# Patient Record
Sex: Male | Born: 1960 | Race: White | Hispanic: No | Marital: Married | Smoking: Never smoker
Health system: Southern US, Community
[De-identification: ages and names within clinical notes are randomized; demographics above are authoritative.]

## PROBLEM LIST (undated history)

## (undated) DIAGNOSIS — G4730 Sleep apnea, unspecified: Secondary | ICD-10-CM

## (undated) DIAGNOSIS — M545 Low back pain, unspecified: Secondary | ICD-10-CM

## (undated) DIAGNOSIS — E669 Obesity, unspecified: Secondary | ICD-10-CM

## (undated) DIAGNOSIS — I8390 Asymptomatic varicose veins of unspecified lower extremity: Secondary | ICD-10-CM

## (undated) DIAGNOSIS — T7840XA Allergy, unspecified, initial encounter: Secondary | ICD-10-CM

## (undated) HISTORY — DX: Low back pain, unspecified: M54.50

## (undated) HISTORY — DX: Obesity, unspecified: E66.9

## (undated) HISTORY — DX: Asymptomatic varicose veins of unspecified lower extremity: I83.90

## (undated) HISTORY — DX: Sleep apnea, unspecified: G47.30

## (undated) HISTORY — DX: Low back pain: M54.5

## (undated) HISTORY — PX: HERNIA REPAIR: SHX51

## (undated) HISTORY — DX: Allergy, unspecified, initial encounter: T78.40XA

## (undated) HISTORY — PX: OTHER SURGICAL HISTORY: SHX169

---
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
## 2002-07-25 ENCOUNTER — Ambulatory Visit (HOSPITAL_BASED_OUTPATIENT_CLINIC_OR_DEPARTMENT_OTHER): Admission: RE | Admit: 2002-07-25 | Discharge: 2002-07-25 | Payer: Self-pay | Admitting: General Surgery

## 2002-07-25 ENCOUNTER — Encounter (INDEPENDENT_AMBULATORY_CARE_PROVIDER_SITE_OTHER): Payer: Self-pay | Admitting: Specialist

## 2003-08-14 ENCOUNTER — Ambulatory Visit (HOSPITAL_COMMUNITY): Admission: RE | Admit: 2003-08-14 | Discharge: 2003-08-14 | Payer: Self-pay | Admitting: General Surgery

## 2005-10-19 ENCOUNTER — Encounter: Payer: Self-pay | Admitting: Family Medicine

## 2005-11-28 ENCOUNTER — Encounter: Admission: RE | Admit: 2005-11-28 | Discharge: 2005-11-28 | Payer: Self-pay | Admitting: Orthopedic Surgery

## 2005-12-19 ENCOUNTER — Encounter: Admission: RE | Admit: 2005-12-19 | Discharge: 2005-12-19 | Payer: Self-pay | Admitting: Orthopedic Surgery

## 2006-03-21 ENCOUNTER — Emergency Department (HOSPITAL_COMMUNITY): Admission: EM | Admit: 2006-03-21 | Discharge: 2006-03-21 | Payer: Self-pay | Admitting: Emergency Medicine

## 2006-03-22 ENCOUNTER — Emergency Department (HOSPITAL_COMMUNITY): Admission: EM | Admit: 2006-03-22 | Discharge: 2006-03-22 | Payer: Self-pay | Admitting: Emergency Medicine

## 2006-04-02 ENCOUNTER — Encounter: Payer: Self-pay | Admitting: Family Medicine

## 2006-09-24 ENCOUNTER — Ambulatory Visit: Payer: Self-pay | Admitting: Family Medicine

## 2006-09-24 DIAGNOSIS — M5410 Radiculopathy, site unspecified: Secondary | ICD-10-CM | POA: Insufficient documentation

## 2006-09-24 DIAGNOSIS — Z9189 Other specified personal risk factors, not elsewhere classified: Secondary | ICD-10-CM | POA: Insufficient documentation

## 2006-09-24 LAB — CONVERTED CEMR LAB
Bilirubin Urine: NEGATIVE
Blood in Urine, dipstick: NEGATIVE
Ketones, urine, test strip: NEGATIVE
Nitrite: NEGATIVE
Protein, U semiquant: NEGATIVE
Urobilinogen, UA: NEGATIVE

## 2006-09-25 LAB — CONVERTED CEMR LAB
ALT: 26 units/L (ref 0–53)
Albumin: 4.1 g/dL (ref 3.5–5.2)
Alkaline Phosphatase: 49 units/L (ref 39–117)
BUN: 13 mg/dL (ref 6–23)
Basophils Absolute: 0 10*3/uL (ref 0.0–0.1)
Basophils Relative: 0.2 % (ref 0.0–1.0)
CO2: 29 meq/L (ref 19–32)
Calcium: 9.2 mg/dL (ref 8.4–10.5)
Cholesterol: 158 mg/dL (ref 0–200)
Creatinine, Ser: 0.8 mg/dL (ref 0.4–1.5)
HDL: 31.9 mg/dL — ABNORMAL LOW (ref >39.0)
Hemoglobin: 14.3 g/dL (ref 13.0–17.0)
LDL Cholesterol: 113 mg/dL — ABNORMAL HIGH (ref 0–99)
MCHC: 34.3 g/dL (ref 30.0–36.0)
Monocytes Absolute: 0.4 10*3/uL (ref 0.2–0.7)
Monocytes Relative: 7.6 % (ref 3.0–11.0)
Platelets: 280 10*3/uL (ref 150–400)
Potassium: 4.3 meq/L (ref 3.5–5.1)
RBC: 4.7 M/uL (ref 4.22–5.81)
RDW: 12.6 % (ref 11.5–14.6)
Total Bilirubin: 0.9 mg/dL (ref 0.3–1.2)
Total CHOL/HDL Ratio: 5
Triglycerides: 67 mg/dL (ref 0–149)
VLDL: 13 mg/dL (ref 0–40)

## 2006-09-30 ENCOUNTER — Encounter: Payer: Self-pay | Admitting: Family Medicine

## 2006-10-16 ENCOUNTER — Ambulatory Visit: Payer: Self-pay | Admitting: Family Medicine

## 2006-10-16 DIAGNOSIS — G4733 Obstructive sleep apnea (adult) (pediatric): Secondary | ICD-10-CM | POA: Insufficient documentation

## 2006-11-09 ENCOUNTER — Ambulatory Visit: Payer: Self-pay | Admitting: Pulmonary Disease

## 2006-11-16 ENCOUNTER — Ambulatory Visit: Payer: Self-pay | Admitting: Pulmonary Disease

## 2006-11-16 ENCOUNTER — Ambulatory Visit (HOSPITAL_BASED_OUTPATIENT_CLINIC_OR_DEPARTMENT_OTHER): Admission: RE | Admit: 2006-11-16 | Discharge: 2006-11-16 | Payer: Self-pay | Admitting: Pulmonary Disease

## 2006-12-08 ENCOUNTER — Ambulatory Visit: Payer: Self-pay | Admitting: Pulmonary Disease

## 2007-01-28 ENCOUNTER — Ambulatory Visit: Payer: Self-pay | Admitting: Pulmonary Disease

## 2007-01-28 DIAGNOSIS — E669 Obesity, unspecified: Secondary | ICD-10-CM | POA: Insufficient documentation

## 2007-01-29 ENCOUNTER — Encounter: Payer: Self-pay | Admitting: Family Medicine

## 2007-06-03 ENCOUNTER — Encounter: Payer: Self-pay | Admitting: Family Medicine

## 2007-06-22 ENCOUNTER — Encounter: Payer: Self-pay | Admitting: Pulmonary Disease

## 2007-08-06 ENCOUNTER — Ambulatory Visit: Payer: Self-pay | Admitting: Pulmonary Disease

## 2008-07-26 ENCOUNTER — Ambulatory Visit: Payer: Self-pay | Admitting: Pulmonary Disease

## 2008-08-31 ENCOUNTER — Encounter: Payer: Self-pay | Admitting: Family Medicine

## 2008-11-10 ENCOUNTER — Encounter (INDEPENDENT_AMBULATORY_CARE_PROVIDER_SITE_OTHER): Payer: Self-pay | Admitting: *Deleted

## 2010-01-01 ENCOUNTER — Ambulatory Visit: Payer: Self-pay | Admitting: Family Medicine

## 2010-01-01 ENCOUNTER — Encounter: Payer: Self-pay | Admitting: Family Medicine

## 2010-01-03 ENCOUNTER — Encounter: Payer: Self-pay | Admitting: Family Medicine

## 2010-01-08 LAB — CONVERTED CEMR LAB
ALT: 23 units/L (ref 0–53)
AST: 22 units/L (ref 0–37)
Alkaline Phosphatase: 48 units/L (ref 39–117)
BUN: 16 mg/dL (ref 6–23)
Bilirubin, Direct: 0.2 mg/dL (ref 0.0–0.3)
Cholesterol: 149 mg/dL (ref 0–200)
Creatinine, Ser: 0.6 mg/dL (ref 0.4–1.5)
Eosinophils Relative: 1 % (ref 0.0–5.0)
GFR calc non Af Amer: 140.58 mL/min (ref >60.00)
LDL Cholesterol: 96 mg/dL (ref 0–99)
Lymphocytes Relative: 27.5 % (ref 12.0–46.0)
Monocytes Relative: 6.3 % (ref 3.0–12.0)
Neutrophils Relative %: 64.7 % (ref 43.0–77.0)
PSA: 1.74 ng/mL (ref 0.10–4.00)
Platelets: 258 10*3/uL (ref 150.0–400.0)
Potassium: 4.4 meq/L (ref 3.5–5.1)
RBC: 4.73 M/uL (ref 4.22–5.81)
Total Bilirubin: 1.1 mg/dL (ref 0.3–1.2)
Total CHOL/HDL Ratio: 4
Triglycerides: 88 mg/dL (ref 0.0–149.0)
VLDL: 17.6 mg/dL (ref 0.0–40.0)
WBC: 5.3 10*3/uL (ref 4.5–10.5)

## 2010-02-14 NOTE — Letter (Signed)
Summary: Patient HX Form  Patient HX Form   Imported By: Georgian Co 01/03/2010 16:03:43  _____________________________________________________________________  External Attachment:    Type:   Image     Comment:   External Document

## 2010-02-14 NOTE — Assessment & Plan Note (Signed)
Summary: PT RE-EST / PT REQ CPX (PT WILL COME FASTING FOR LABS) // RS   Vital Signs:  Patient profile:   50 year old male Height:      70 inches Weight:      254 pounds BMI:     36.58 O2 Sat:      97 % Temp:     98.4 degrees F Pulse rate:   77 / minute BP sitting:   112 / 80  (left arm) Cuff size:   large  Vitals Entered By: Pura Spice, RN (January 01, 2010 9:01 AM) CC: to re-est  requesting cpx fasting for labs  Is Patient Diabetic? No   History of Present Illness: 50 yr old male to re-establish with [place] and for a cpx. He main complaint today is low back pain, which has bothered him for years. He plans on seeing Dr. Charlett Blake again after the holidays. He is using either Aleeve or Advil as needed . He knows he needs to lose weight.   Allergies (verified): No Known Drug Allergies  Past History:  Past Medical History: Low back pain, has seen Dr. Lunette Stands [facility], sees Dr. Coralyn Helling     - PSG 12/01/06 AHI 15     - CPAP 8 cm Varicose Veins Obesity  Past Surgical History: Tonsillectomy 1967                                                                                                                                                       Umbilical hernia repairs 2005 and 2006 (last by Dr. Abbey Chatters) right lower leg vein laser ablations per Dr. Consuela Mimes colonoscopy 01-29-07 per Dr. Carman Ching, sigmoid diverticulosis and internal hemorrhoids, repeat in 5 yrs  Family History: Reviewed history from 09/24/2006 and no changes required. Family History of Arthritis Family History of Colon CA 1st degree relative <60 (mother) Family History Hypertension Family History Lung cancer  Social History: Reviewed history from 09/24/2006 and no changes required. Occupation: Married Never Smoked Alcohol use-no Drug use-no Regular exercise-no  Review of Systems  The patient denies anorexia, fever, weight loss, vision loss, decreased hearing, hoarseness, chest pain,  syncope, dyspnea on exertion, peripheral edema, prolonged cough, headaches, hemoptysis, abdominal pain, melena, hematochezia, severe indigestion/heartburn, hematuria, incontinence, genital sores, muscle weakness, suspicious skin lesions, transient blindness, difficulty walking, depression, unusual weight change, abnormal bleeding, enlarged lymph nodes, angioedema, breast masses, and testicular masses.    Physical Exam  General:  overweight-appearing.   Head:  Normocephalic and atraumatic without obvious abnormalities. No apparent alopecia or balding. Eyes:  No corneal or conjunctival inflammation noted. EOMI. Perrla. Funduscopic exam benign, without hemorrhages, exudates or papilledema. Vision grossly normal. Ears:  External ear exam shows no significant lesions or deformities.  Otoscopic examination reveals clear canals, tympanic membranes are intact bilaterally without bulging,  retraction, inflammation or discharge. Hearing is grossly normal bilaterally. Nose:  External nasal examination shows no deformity or inflammation. Nasal mucosa are pink and moist without lesions or exudates. Mouth:  Oral mucosa and oropharynx without lesions or exudates.  Teeth in good repair. Neck:  No deformities, masses, or tenderness noted. Chest Wall:  No deformities, masses, tenderness or gynecomastia noted. Lungs:  Normal respiratory effort, chest expands symmetrically. Lungs are clear to auscultation, no crackles or wheezes. Heart:  Normal rate and regular rhythm. S1 and S2 normal without gallop, murmur, click, rub or other extra sounds. EKG normal  Abdomen:  Bowel sounds positive,abdomen soft and non-tender without masses, organomegaly or hernias noted. Rectal:  No external abnormalities noted. Normal sphincter tone. No rectal masses or tenderness. Heme neg Genitalia:  Testes bilaterally descended without nodularity, tenderness or masses. No scrotal masses or lesions. No penis lesions or urethral  discharge. Prostate:  Prostate gland firm and smooth, no enlargement, nodularity, tenderness, mass, asymmetry or induration. Msk:  No deformity or scoliosis noted of thoracic or lumbar spine.   Pulses:  R and L carotid,radial,femoral,dorsalis pedis and posterior tibial pulses are full and equal bilaterally Extremities:  No clubbing, cyanosis, edema, or deformity noted with normal full range of motion of all joints.   Neurologic:  No cranial nerve deficits noted. Station and gait are normal. Plantar reflexes are down-going bilaterally. DTRs are symmetrical throughout. Sensory, motor and coordinative functions appear intact. Skin:  Intact without suspicious lesions or rashes Cervical Nodes:  No lymphadenopathy noted Axillary Nodes:  No palpable lymphadenopathy Inguinal Nodes:  No significant adenopathy Psych:  Cognition and judgment appear intact. Alert and cooperative with normal attention span and concentration. No apparent delusions, illusions, hallucinations   Impression & Recommendations:  Problem # 1:  EXAMINATION, ROUTINE MEDICAL (ICD-V70.0)  Orders: UA Dipstick w/o Micro (automated)  (81003) Hemoccult Guaiac-1 spec.(in office) (82270) EKG w/ Interpretation (93000) Venipuncture (16109) TLB-Lipid Panel (80061-LIPID) TLB-BMP (Basic Metabolic Panel-BMET) (80048-METABOL) TLB-CBC Platelet - w/Differential (85025-CBCD) TLB-Hepatic/Liver Function Pnl (80076-HEPATIC) TLB-TSH (Thyroid Stimulating Hormone) (84443-TSH) TLB-PSA (Prostate Specific Antigen) (84153-PSA)  Complete Medication List: 1)  Diasense Multivitamin Tabs (Multiple vitamins-minerals) .... Take 1 tablet by mouth once a day 2)  Meloxicam 15 Mg Tabs (Meloxicam) .... Once daily  Patient Instructions: 1)  It is important that you exercise reguarly at least 20 minutes 5 times a week. If you develop chest pain, have severe difficulty breathing, or feel very tired, stop exercising immediately and seek medical attention.  2)   You need to lose weight. Consider a lower calorie diet and regular exercise.  3)  get fasting labs today 4)  try Meloxicam daily for the back pain  Prescriptions: MELOXICAM 15 MG TABS (MELOXICAM) once daily  #30 x 11   Entered and Authorized by:   Nelwyn Salisbury MD   Signed by:   Nelwyn Salisbury MD on 01/01/2010   Method used:   Electronically to        [facility]  [phone]* (retail)       [address] [place], [place]  [address]       Ph: [phone] or [phone]       Fax: [phone]   RxID:   [id number]    Orders Added: 1)  New Patient 40-64 years [99386] 2)  UA Dipstick w/o Micro (automated)  [81003] 3)  Hemoccult Guaiac-1 spec.(in office) [82270] 4)  EKG w/ Interpretation [93000] 5)  Venipuncture [03474] 6)  TLB-Lipid Panel [80061-LIPID] 7)  TLB-BMP (Basic Metabolic Panel-BMET) [80048-METABOL] 8)  TLB-CBC Platelet - w/Differential [85025-CBCD] 9)  TLB-Hepatic/Liver Function Pnl [80076-HEPATIC] 10)  TLB-TSH (Thyroid Stimulating Hormone) [84443-TSH] 11)  TLB-PSA (Prostate Specific Antigen) [78469-GEX]   Immunization History:  Tetanus/Td Immunization History:    Tetanus/Td:  historical (01/13/2006)   Immunization History:  Tetanus/Td Immunization History:    Tetanus/Td:  Historical (01/13/2006)  Appended Document: Orders Update    Clinical Lists Changes  Orders: Added new Service order of Specimen Handling (52841) - Signed      Appended Document: PT RE-EST / PT REQ CPX (PT WILL COME FASTING FOR LABS) // RS  Laboratory Results   Urine Tests    Routine Urinalysis   Color: yellow Appearance: Clear Glucose: negative   (Normal Range: Negative) Bilirubin: negative   (Normal Range: Negative) Ketone: negative   (Normal Range: Negative) Spec. Gravity: 1.025   (Normal Range: 1.003-1.035) Blood: negative   (Normal Range: Negative) pH: 5.0   (Normal Range: 5.0-8.0) Protein: negative   (Normal Range: Negative) Urobilinogen: 0.2    (Normal Range: 0-1) Nitrite: negative   (Normal Range: Negative) Leukocyte Esterace: negative   (Normal Range: Negative)    Comments: Rita Ohara  January 01, 2010 11:39 AM

## 2010-05-28 NOTE — Assessment & Plan Note (Signed)
[facility]                             PULMONARY OFFICE NOTE   NAME:PAGEBookert, Guzzi                           MRN:          [id number]  DATE:12/08/2006                            DOB:          07-14-1960    I saw Mr. Wile in followup today after he had undergone his overnight  polysomnogram.   This was done on November 16, 2006.  He followed a split-night-study  protocol.  During the diagnostic portion of the test he was found to  have moderate obstructive sleep apnea with an apnea/hypopnea index of 15  and oxygen saturation nadir of 89%.  During the therapeutic portion of  the test he was titrated to a CPAP pressure setting of 8 with a  reduction in his apnea/hypopnea index to 4.  At this pressure he was  observed in REM sleep but only had a minimal amount of supine sleep.  I  have reviewed the results of his sleep study with him.  I had again  emphasized to him the importance of diet, exercise and weight reduction.  I had reviewed other treatment options for his sleep apnea including an  oral appliance and surgical intervention.   Given the fact that he did have a good response to his titration portion  of the study, I will initiate him on CPAP at 8 cmH2O.  I will then  follow up with him in approximately 2 months to determine if any further  adjustments will need to be made.     Coralyn Helling, MD  Electronically Signed    VS/MedQ  DD: 12/08/2006  DT: 12/08/2006  Job #: [id number]   cc:   Tera Mater. Clent Ridges, MD

## 2010-05-28 NOTE — Assessment & Plan Note (Signed)
[facility]                             PULMONARY OFFICE NOTE   NAME:PAGEJoshiah, Traynham                           MRN:          [id number]  DATE:11/09/2006                            DOB:          [birth year]    REFERRING PHYSICIAN:  Tera Mater. Clent Ridges, MD   REFERRING PHYSICIAN:  Tera Mater. Clent Ridges, MD.   I met Mr. Battiste today for evaluation of his sleep difficulties.   He said that he wanted to have further evaluation of his sleep after a  friend of his wife's was recently in a car accident and was later found  out to have sleep apnea.  His wife has noticed him having problems with  snoring for years, although he is not sure if she has ever noticed him  actually stop breathing while he is asleep.  He says he wakes up a few  times during the night, sometimes related to back pain as well as to  grinding his teeth.  He usually goes to bed at around 11:30, although he  will sometimes fall asleep prior to this.  He falls asleep fairly  quickly.  He wakes up at 5:45 in the morning to get ready to go to work.  He will occasionally get a headache when he wakes up in the morning.  He  usually still feels tired when he wakes up.  He will sometimes sleep in  and awakens until about 9 o'clock.  He says he usually ends up trying to  take a nap in the afternoon which helps to some degree.  He denies any  history of sleep hallucinations, sleep paralysis or cataplexy.  There is  no history of restless leg syndrome.  He is not currently using anything  now to help him fall asleep at night.  He does drink several caffeinated  beverages throughout the day.  He denies any history of sleep walking,  sleep talking, nightmares or night terrors.  His Epworth score today is  7/24.   PAST MEDICAL HISTORY:  Significant for  1. Umbilical hernia, status post repair in 2004 and 2005.  2. Tonsillectomy as a child.  3. Sciatica.  4. Varicose veins.   CURRENT MEDICATIONS:  1. Multivitamin  daily.  2. Tylenol p.r.n.  3. Ibuprofen p.r.n.   ALLERGIES:  No known drug allergies.   SOCIAL HISTORY:  He is married.  He has no children.  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  There is no history of  tobacco or alcohol use.   FAMILY HISTORY:  Significant for his father who had emphysema and lung  cancer and his mother had colon cancer.   REVIEW OF SYSTEMS:  Unremarkable except for as stated above.   PHYSICAL EXAMINATION:  VITAL SIGNS:  He is 5 feet 10 inches tall, 266  pounds, temperature 98.7, blood pressure 124/88, heart rate 88, oxygen  saturation 96% on room air.  HEENT:  Pupils reactive.  There is no sinus tenderness.  He has narrow  nasal angles.  He has Mallampati III airway.  There are erosions over  his frontal teeth.  He has scalloped borders of his tongue.  NECK:  There was no lymphadenopathy, no thyromegaly.  CHEST:  No wheezing or rales.  CARDIOVASCULAR:  S1 and S2.  ABDOMEN:  Obese, soft, nontender.  EXTREMITIES:  No edema.   IMPRESSION:  He certainly has symptoms as well as physical findings  which would be concerning for sleep disordered breathing.  To further  evaluate this, I will arrange for him to undergo an overnight  polysomnogram.  In the meantime, I have discussed with him the  importance of diet, exercise and weight reduction as well as avoidance  of alcohol and sedatives.  Driving precautions were reviewed with him as  well.  I will follow up with him in the office after having a chance to  review his sleep study.     Coralyn Helling, MD  Electronically Signed    VS/MedQ  DD: 11/09/2006  DT: 11/09/2006  Job #: [id number]

## 2010-05-28 NOTE — Procedures (Signed)
NAME:  Brent Gordon, Brent Gordon                  ACCOUNT NO.:  [id number]   MEDICAL RECORD NO.:  [id number]          PATIENT TYPE:  OUT   LOCATION:  SLEEP CENTER                 FACILITY:  [facility]   PHYSICIAN:  Coralyn Helling, MD        DATE OF BIRTH:  03-Aug-1960   DATE OF STUDY:                            NOCTURNAL POLYSOMNOGRAM   REFERRING PHYSICIAN:   INDICATION FOR STUDY:  This is an individual who has a history of sleep  obstruction and excess of daytime sleepiness.  He is referred to the  sleep lab for evaluation of hypersomnia with obstructive sleep apnea.   EPWORTH SLEEPINESS SCORE:  7.   MEDICATIONS:  Aleve.   SLEEP ARCHITECTURE:  The patient followed a split-night study protocol.  Through the diagnostic portion of the test, total test time was 199  minutes.  Total sleep time was 131 minutes.  Sleep efficiency was 66%  which is reduced.  The patient slept in both the supine and nonsupine  positions.   RESPIRATORY DATA:  The average respiratory rate was 20.  Through the  diagnostic portion of the test, the overall apnea polypnea index was 15.  There was one central apneic event.  The remainder of the events were  obstructive in nature.  Moderate snoring was noted by the technician.  During the therapy portion of the test, the patient was titrated from  CPAP pressure setting of 4 to 8 cm of water.  At a CPAP pressure of 8 cm  of water, the apnea polypnea index was reduced to 4.  At this pressure  setting, the patient was observed in REM sleep, but had very minimal  amount of supine sleep.   OXYGEN DATA:  The baseline oxygenation was 94%.  The oxygen saturation  nadir was 89%.  The oxygen saturation nadir had a CPAP of 8, it was 89%.   CARDIAC DATA:  The average heart rate was 65.  The rhythm strip showed  normal sinus rhythm.   MOVEMENT-PARASOMNIA:  The periodic limb movement index was 9.2.   IMPRESSIONS-RECOMMENDATIONS:  This was a split-night study protocol.  During the diagnostic  portion of the test, the apnea polypnea index was  found to be 15 and the oxygen saturation nadir was 89% consistent with  moderate obstructive sleep apnea.  During the therapeutic portion of the  test, the patient was titrated to a CPAP setting of 8 with reduction of  his apnea polypnea index to 4.  He was observed in REM sleep, however,  had very minimal amount of supine sleep.  What I have recommended is to  start the patient on CPAP at 8 cm of water.  If he  is having difficulty with this, he could either return to the sleep lab  for a repeat titration study, undergo an outpatient auto-CPAP titration  study or alternatively be titrated using BiPAP.      Coralyn Helling, MD  Diplomat, American Board of Sleep Medicine  Electronically Signed     VS/MEDQ  D:  12/01/2006 14:25:55  T:  12/02/2006 09:32:00  Job:  [id number]

## 2010-05-31 NOTE — Op Note (Signed)
NAME:  Brent Gordon, Brent Gordon                              ACCOUNT NO.:  [id number]   MEDICAL RECORD NO.:  [id number]                   PATIENT TYPE:  AMB   LOCATION:  DSC                                  FACILITY:  [facility]   PHYSICIAN:  Anselm Pancoast. Zachery Dakins, M.D.          DATE OF BIRTH:  17-Aug-1960   DATE OF PROCEDURE:  07/26/2002  DATE OF DISCHARGE:                                 OPERATIVE REPORT   PREOPERATIVE DIAGNOSIS:  Umbilical hernia.   POSTOPERATIVE DIAGNOSIS:  Umbilical hernia.   OPERATION:  Repair of umbilical hernia with mesh reinforcement.   General anesthesia.   SURGEON:  Anselm Pancoast. Zachery Dakins, M.D.   HISTORY:  Brent Gordon is a 50 year old male who was referred to me by  Talmadge Coventry, M.D., for management of a symptomatic umbilical hernia.  On examination he did not have a very big fascial defect but with straining,  he would get a sort of an egg-sized mass to the right of the umbilicus and I  recommended this be surgically corrected.  He has had intermittent episodes  of cramping, bloating abdominal pain, but they are not frequent.   On prepping, the area was first clipped and then the hair removed with tape  and then prepped with Betadine surgical solution and draped in a sterile  manner.  He had had induction with general anesthesia, endotracheal tube.  The patient weighs about 205 pounds.  A transverse incision was made, the  sharp dissection down through the subcutaneous tissue, and the fairly thick-  walled umbilical hernia sac was removed circumferentially.  I opened the  hernia, and there were two little wads of preperitoneal fat sort of trapped.  There was no actual loop of intestine there, and it was free, and then I  freed the peritoneum circumferentially and then closed it with a 2-0 Vicryl,  first with a pursestring and then in separated interrupted sutures,  reinforcing the closure.  Next I freed up the fascia circumferentially so  this could be closed  transversely but would also allow [place] to put a little  piece of Prolene mesh, about a 1-1/2 x 3 inch area kind of in the  preperitoneal space.  This was anchored with four stitches accordingly  through the muscle layer, and then I placed a superior and inferior stitch  to kind of bring up the midportion of the fascia to the muscle layer.  Next  the interrupted transverse vertical stitches incorporated in the center of  the mesh with the center of the stitch was used to actually close the  fascial defect.  Its closed course ________.  I then anesthetized the area  with about 20 mL of Marcaine with adrenalin and then closed the subcutaneous  tissue with a 3-  0 Vicryl and then closed the skin with interrupted 4-0 nylon sutures.  Hopefully, the patient will not have two much pain, and he was  instructed to  not shower for about three days.  I will see him in the office in  approximately a week.  He hopes to return to work next week and he should be  able to do that.                                               Anselm Pancoast. Zachery Dakins, M.D.    WJW/MEDQ  D:  07/25/2002  T:  07/26/2002  Job:  [id number]   cc:   Talmadge Coventry, M.D.  [address], [address]  [place]  [place] [address]  Fax: [phone]

## 2010-05-31 NOTE — Op Note (Signed)
NAME:  Brent Gordon, Brent Gordon                              ACCOUNT NO.:  [id number]   MEDICAL RECORD NO.:  [id number]                   PATIENT TYPE:  AMB   LOCATION:  DAY                                  FACILITY:  [facility]   PHYSICIAN:  Adolph Pollack, M.D.            DATE OF BIRTH:  1960/08/25   DATE OF PROCEDURE:  08/14/2003  DATE OF DISCHARGE:                                 OPERATIVE REPORT   PREOPERATIVE DIAGNOSIS:  Recurrent umbilical hernia.   POSTOPERATIVE DIAGNOSIS:  Recurrent umbilical hernia.   PROCEDURE:  Laparoscopic repair of recurrent umbilical hernia with mesh.   SURGEON:  Adolph Pollack, M.D.   ASSISTANT:  Anselm Pancoast. Zachery Dakins, M.D.   ANESTHESIA:  General.   INDICATIONS:  Brent Gordon is a 50 year old man who is very stout and had an  umbilical hernia repair with mesh done approximately one year ago.  He does  a lot of heavy lifting repetitively, and he has recurrence of his hernia.  He now presents for laparoscopic repair.  The procedure and the risks were  discussed with him preoperatively.   TECHNIQUE:  He is seen in the holding area and then brought to the operating  room, placed supine on the operating room table, and general anesthetic was  administered.  A Foley catheter was placed in his bladder.  The abdominal  wall here was clipped.  The abdominal wall was then sterilely prepped and  draped.  A small incision was made in the left upper quadrant.  Using the 5  mm Versaport trocar, the peritoneal cavity was entered.  I went through the  obturator from the trocar, and looking directly down was omentum.  No  visceral injury was noted.  Once the abdomen was insufflated with CO2 gas, I  then placed a 10 mm trocar in the left mid lateral abdomen and the 5 mm  trocar in the right mid lateral abdomen.  I identified the hernia.  I had  reduced the contents prior to that after the induction of anesthesia.  Nothing was incarcerated in the hernia.  I could see where the  mesh was  pulled away and was on the left side of the hernia.  I then marked the  quadrants of the hernia with a spinal needle and measured 4 cm away from  these, drawing a large oval.  A piece of polypropylene mesh with a  nonadhesive barrier is brought into the field and cut appropriately.  Four  anchoring sutures of 0 Novofil were placed in the four quadrants.  The mesh  was then hydrated and then introduced into the peritoneal cavity.  Four  smaller incisions were made at the four quadrants around the area of the  hernia.  Anchoring sutures were then brought up across a fascial bridge at  each of the four quadrants with the smooth side, nonadhesive barrier facing  down, and the rough  side facing up.  I then tied these down, anchoring the  mesh to the abdominal wall.  I then introduced another 5 mm trocar into the  right lower quadrant.  Using the spiral tacking device, I then anchored the  mesh further to the anterior abdominal wall with the spiral tacks.  This  provided for more than adequate coverage of the defect.   Following this, I released the CO2 gas and watched the underlying omentum  become approximated with the mesh.  I then removed all of the trocars.  All  of the small incisions were then closed with 4-0 Monocryl subcuticular  stitches.  Steri-Strips and sterile dressings were applied.  He tolerated  the procedure well without any apparent complications and was taken to the  recovery room in satisfactory condition.  An abdominal binder will be  applied.   The plan would be to try to discharge him to home today as an outpatient  procedure.  If the pain control is too difficult, we may have to keep him  for 23-hour observation.                                               Adolph Pollack, M.D.    Kari Baars  D:  08/14/2003  T:  08/14/2003  Job:  [id number]

## 2011-03-18 ENCOUNTER — Other Ambulatory Visit (INDEPENDENT_AMBULATORY_CARE_PROVIDER_SITE_OTHER): Payer: BC Managed Care – PPO

## 2011-03-18 DIAGNOSIS — Z0000 Encounter for general adult medical examination without abnormal findings: Secondary | ICD-10-CM

## 2011-03-18 LAB — CBC WITH DIFFERENTIAL/PLATELET
Basophils Relative: 0.5 % (ref 0.0–3.0)
Eosinophils Absolute: 0.1 10*3/uL (ref 0.0–0.7)
MCHC: 33.1 g/dL (ref 30.0–36.0)
MCV: 88.9 fl (ref 78.0–100.0)
Monocytes Absolute: 0.4 10*3/uL (ref 0.1–1.0)
Neutrophils Relative %: 67 % (ref 43.0–77.0)
RBC: 4.79 Mil/uL (ref 4.22–5.81)

## 2011-03-18 LAB — BASIC METABOLIC PANEL
BUN: 22 mg/dL (ref 6–23)
CO2: 25 mEq/L (ref 19–32)
Chloride: 105 mEq/L (ref 96–112)
Creatinine, Ser: 0.7 mg/dL (ref 0.4–1.5)
Glucose, Bld: 97 mg/dL (ref 70–99)

## 2011-03-18 LAB — POCT URINALYSIS DIPSTICK
Ketones, UA: NEGATIVE
Leukocytes, UA: NEGATIVE
Nitrite, UA: NEGATIVE
Urobilinogen, UA: 0.2
pH, UA: 5

## 2011-03-18 LAB — LIPID PANEL
HDL: 35.2 mg/dL — ABNORMAL LOW (ref >39.00)
Total CHOL/HDL Ratio: 4
VLDL: 9.2 mg/dL (ref 0.0–40.0)

## 2011-03-18 LAB — TSH: TSH: 2.06 u[IU]/mL (ref 0.35–5.50)

## 2011-03-18 LAB — PSA: PSA: 1.77 ng/mL (ref 0.10–4.00)

## 2011-03-18 LAB — HEPATIC FUNCTION PANEL
Albumin: 3.9 g/dL (ref 3.5–5.2)
Bilirubin, Direct: 0.1 mg/dL (ref 0.0–0.3)
Total Protein: 6.2 g/dL (ref 6.0–8.3)

## 2011-03-21 NOTE — Progress Notes (Signed)
Quick Note:  Left voice message ______ 

## 2011-03-24 ENCOUNTER — Encounter: Payer: Self-pay | Admitting: Family Medicine

## 2011-03-24 ENCOUNTER — Ambulatory Visit (INDEPENDENT_AMBULATORY_CARE_PROVIDER_SITE_OTHER): Payer: BC Managed Care – PPO | Admitting: Family Medicine

## 2011-03-24 VITALS — BP 128/80 | HR 97 | Temp 98.7°F | Ht 70.5 in | Wt 255.0 lb

## 2011-03-24 DIAGNOSIS — Z0000 Encounter for general adult medical examination without abnormal findings: Secondary | ICD-10-CM

## 2011-03-24 NOTE — Progress Notes (Signed)
  Subjective:    Patient ID: Brent Gordon, male    DOB: Jun 04, 1960, 51 y.o.   MRN: [id number]  HPI 51 yr old male for a cpx. He feel good with no concerns. He has put on some weight and admits to not exercising much.    Review of Systems  Constitutional: Negative.   HENT: Negative.   Eyes: Negative.   Respiratory: Negative.   Cardiovascular: Negative.   Gastrointestinal: Negative.   Genitourinary: Negative.   Musculoskeletal: Negative.   Skin: Negative.   Neurological: Negative.   Hematological: Negative.   Psychiatric/Behavioral: Negative.        Objective:   Physical Exam  Constitutional: He is oriented to person, place, and time. He appears well-developed and well-nourished. No distress.  HENT:  Head: Normocephalic and atraumatic.  Right Ear: External ear normal.  Left Ear: External ear normal.  Nose: Nose normal.  Mouth/Throat: Oropharynx is clear and moist. No oropharyngeal exudate.  Eyes: Conjunctivae and EOM are normal. Pupils are equal, round, and reactive to light. Right eye exhibits no discharge. Left eye exhibits no discharge. No scleral icterus.  Neck: Neck supple. No JVD present. No tracheal deviation present. No thyromegaly present.  Cardiovascular: Normal rate, regular rhythm, normal heart sounds and intact distal pulses.  Exam reveals no gallop and no friction rub.   No murmur heard.      EKG normal   Pulmonary/Chest: Effort normal and breath sounds normal. No respiratory distress. He has no wheezes. He has no rales. He exhibits no tenderness.  Abdominal: Soft. Bowel sounds are normal. He exhibits no distension and no mass. There is no tenderness. There is no rebound and no guarding.  Genitourinary: Rectum normal, prostate normal and penis normal. Guaiac negative stool. No penile tenderness.  Musculoskeletal: Normal range of motion. He exhibits no edema and no tenderness.  Lymphadenopathy:    He has no cervical adenopathy.  Neurological: He is alert and  oriented to person, place, and time. He has normal reflexes. No cranial nerve deficit. He exhibits normal muscle tone. Coordination normal.  Skin: Skin is warm and dry. No rash noted. He is not diaphoretic. No erythema. No pallor.  Psychiatric: He has a normal mood and affect. His behavior is normal. Judgment and thought content normal.          Assessment & Plan:  Well exam. Encouraged him to start swimming again and to lose weight.

## 2011-09-26 HISTORY — PX: FOOT SURGERY: SHX648

## 2011-09-29 ENCOUNTER — Telehealth: Payer: Self-pay | Admitting: Pulmonary Disease

## 2011-09-29 DIAGNOSIS — G4733 Obstructive sleep apnea (adult) (pediatric): Secondary | ICD-10-CM

## 2011-09-29 NOTE — Telephone Encounter (Signed)
LMTCB

## 2011-09-29 NOTE — Telephone Encounter (Signed)
Pt returned call. Kathleen W Perdue  

## 2011-09-30 NOTE — Telephone Encounter (Signed)
Order was placed and pt is aware. Nothing further was needed

## 2011-09-30 NOTE — Telephone Encounter (Signed)
I spoke with the pt and he was last seen 07-2008 by Dr. Craige Cotta. Pt is asking for an order be sent to [facility] for a new mask and supplies for his CPAP. Pt has a pending appt on 11-10-11 with Dr. Craige Cotta. Please advise i ok to send order. Thanks. Carron Curie, CMA

## 2011-09-30 NOTE — Telephone Encounter (Signed)
Okay to send order. 

## 2011-11-10 ENCOUNTER — Encounter: Payer: Self-pay | Admitting: Pulmonary Disease

## 2011-11-10 ENCOUNTER — Ambulatory Visit (INDEPENDENT_AMBULATORY_CARE_PROVIDER_SITE_OTHER): Payer: BC Managed Care – PPO | Admitting: Pulmonary Disease

## 2011-11-10 VITALS — BP 128/82 | HR 85 | Temp 98.2°F | Ht 71.0 in | Wt 245.0 lb

## 2011-11-10 DIAGNOSIS — Z23 Encounter for immunization: Secondary | ICD-10-CM

## 2011-11-10 DIAGNOSIS — G4733 Obstructive sleep apnea (adult) (pediatric): Secondary | ICD-10-CM

## 2011-11-10 NOTE — Assessment & Plan Note (Signed)
He likely still has sleep apnea.  He has lost weight since his original set up.  Will arrange for home sleep study to further assess.  Explained that he may need in lab study if he does not have insurance coverage for home sleep study, or if his home study is inconclusive.

## 2011-11-10 NOTE — Progress Notes (Signed)
  Chief Complaint  Patient presents with  . Sleep Consult    Currently has a CPAP, recieves equipment from [place].    History of Present Illness: Brent Gordon is a 51 y.o. male for evaluation of sleep apnea.  I last saw Mr. Brent Gordon in July 2010.  Sleep study from November 2008 showed moderate sleep apnea with AHI 15.  He had been on CPAP 8 cm H2O.  He has not used his CPAP for some time.  He has the same machine since 2008.  He has been swimming, and lost about 15 lbs since 2010.  He had left foot surgery in September 2013.  After surgery he drop his oxygen level and had to resume CPAP.  As a result he decided to follow up with pulmonary/sleep.  He continues to snore, and has trouble with his sleep.  He feels sleepy during the day.  He is sleeping more since he has been out of work since his foot surgery.  His Epworth score is 4 out of 24.  Tests: PSG 12/01/06>>AHI 15  Past Medical History  Diagnosis Date  . Pain, low back   . Varicose veins   . Obesity   . Sleep apnea     PSG 12/01/06 see's Dr Coralyn Helling    Past Surgical History  Procedure Date  . Hernia repair 2005/2006    Dr. Abbey Chatters  . Tonsillectormy 1967  . Leg vein ablation     right lower, Dr. Consuela Mimes  . Colonoscopy 01/29/07    Dr. Carman Ching, sigmoid diverticulosis & internal hemorrhoids repeat in 5 yrs    Current Outpatient Prescriptions on File Prior to Visit  Medication Sig Dispense Refill  . ibuprofen (ADVIL,MOTRIN) 200 MG tablet Take 200 mg by mouth every 6 (six) hours as needed.      . Multiple Vitamin (MULTIVITAMIN) tablet Take 1 tablet by mouth daily.        No Known Allergies  Family History  Problem Relation Age of Onset  . Cancer Mother     colon  . Arthritis    . Hypertension    . Cancer      lung    History  Substance Use Topics  . Smoking status: Never Smoker   . Smokeless tobacco: Never Used  . Alcohol Use: No     Physical Exam: Filed Vitals:   11/10/11 1331  BP:  128/82  Pulse: 85  Temp: 98.2 F (36.8 C)  Height: 5\' 11"  (1.803 m)  Weight: 245 lb (111.131 kg)  SpO2: 98%  ,  Current Encounter SPO2  11/10/11 1331 98%  03/24/11 0859 98%  01/01/10 0849 97%    Wt Readings from Last 3 Encounters:  11/10/11 245 lb (111.131 kg)  03/24/11 255 lb (115.667 kg)  01/01/10 254 lb (115.214 kg)    Body mass index is 34.17 kg/(m^2).   General - No distress ENT - No sinus tenderness, no oral exudate, no LAN Cardiac - s1s2 regular, no murmur, pulses symmetric Chest - No wheeze/rales/dullness, good air entry, normal respiratory excursion Back - No focal tenderness Abd - Soft, non-tender, no organomegaly, + bowel sounds Ext - No edema, Lt lower leg in boot Neuro - Normal strength, cranial nerves intact Skin - No rashes Psych - Normal mood, and behavior.   Assessment/Plan:  Coralyn Helling, MD [facility] Pager:  [phone] 11/10/2011, 1:40 PM

## 2011-11-10 NOTE — Patient Instructions (Signed)
Flu shot today  Will arrange for home sleep study Will call to arrange for follow up after sleep study reviewed  

## 2011-11-10 NOTE — Addendum Note (Signed)
Addended by: Coralyn Helling on: 11/10/2011 02:03 PM   Modules accepted: Orders

## 2011-11-28 ENCOUNTER — Telehealth: Payer: Self-pay | Admitting: Pulmonary Disease

## 2011-11-28 NOTE — Telephone Encounter (Signed)
Pt aware home npsg does not require precert and will pick up alice on 12/01/11 around 4pm and return it on Tuesday am Mr.

## 2011-12-01 ENCOUNTER — Telehealth: Payer: Self-pay | Admitting: Pulmonary Disease

## 2011-12-01 NOTE — Telephone Encounter (Signed)
Pt picked up alice early instead of 4pm Tobe Sos

## 2011-12-04 ENCOUNTER — Telehealth: Payer: Self-pay | Admitting: Pulmonary Disease

## 2011-12-04 ENCOUNTER — Ambulatory Visit (INDEPENDENT_AMBULATORY_CARE_PROVIDER_SITE_OTHER): Payer: BC Managed Care – PPO | Admitting: Pulmonary Disease

## 2011-12-04 DIAGNOSIS — G4733 Obstructive sleep apnea (adult) (pediatric): Secondary | ICD-10-CM

## 2011-12-04 NOTE — Telephone Encounter (Signed)
Home sleep study 12/01/11 >> AHI 5, SpO2 low 83%.  Discussed results with pt.  He is not currently having sleep difficulties, and there is no hx of cardiovascular disease or diabetes.    I have encouraged him to continue with his weight loss efforts.  Advised him that his sleep apnea could get worse if he regains weight.  At this time will continue clinical observation.  Will defer restarting CPAP therapy for now.  Advised him to call for further evaluation if his sleep pattern gets worse, or if he develops additional health problems that can be related to sleep apnea.

## 2012-04-19 HISTORY — PX: COLONOSCOPY: SHX174

## 2012-05-14 ENCOUNTER — Encounter: Payer: Self-pay | Admitting: Family Medicine

## 2012-06-24 ENCOUNTER — Telehealth: Payer: Self-pay | Admitting: Family Medicine

## 2012-06-24 NOTE — Telephone Encounter (Signed)
Due to pt's schedule, he would like to go to [facility] for labs. Could you put in for him?

## 2012-06-24 NOTE — Telephone Encounter (Signed)
Can I order CPE labs and for [name]?

## 2012-06-25 ENCOUNTER — Other Ambulatory Visit: Payer: Self-pay | Admitting: Family Medicine

## 2012-06-25 DIAGNOSIS — Z0000 Encounter for general adult medical examination without abnormal findings: Secondary | ICD-10-CM

## 2012-06-25 NOTE — Telephone Encounter (Signed)
I put future lab orders in computer and left a voice message for pt.

## 2012-06-25 NOTE — Telephone Encounter (Signed)
Yes please set this up  

## 2012-06-30 ENCOUNTER — Other Ambulatory Visit (INDEPENDENT_AMBULATORY_CARE_PROVIDER_SITE_OTHER): Payer: BC Managed Care – PPO

## 2012-06-30 DIAGNOSIS — Z0000 Encounter for general adult medical examination without abnormal findings: Secondary | ICD-10-CM

## 2012-06-30 LAB — BASIC METABOLIC PANEL
BUN: 18 mg/dL (ref 6–23)
Calcium: 8.8 mg/dL (ref 8.4–10.5)
Creatinine, Ser: 0.8 mg/dL (ref 0.4–1.5)
GFR: 106.07 mL/min (ref >60.00)

## 2012-06-30 LAB — LIPID PANEL
Cholesterol: 130 mg/dL (ref 0–200)
HDL: 35.1 mg/dL — ABNORMAL LOW (ref >39.00)
LDL Cholesterol: 82 mg/dL (ref 0–99)
VLDL: 13.4 mg/dL (ref 0.0–40.0)

## 2012-06-30 LAB — URINALYSIS
Hgb urine dipstick: NEGATIVE
Nitrite: NEGATIVE
Specific Gravity, Urine: 1.025 (ref 1.000–1.030)
Total Protein, Urine: NEGATIVE
Urobilinogen, UA: 0.2 (ref 0.0–1.0)

## 2012-06-30 LAB — CBC WITH DIFFERENTIAL/PLATELET
Basophils Relative: 0.4 % (ref 0.0–3.0)
Eosinophils Relative: 1.7 % (ref 0.0–5.0)
Lymphocytes Relative: 32.2 % (ref 12.0–46.0)
MCV: 89.3 fl (ref 78.0–100.0)
Monocytes Absolute: 0.5 10*3/uL (ref 0.1–1.0)
Neutrophils Relative %: 55.9 % (ref 43.0–77.0)
Platelets: 258 10*3/uL (ref 150.0–400.0)
RBC: 4.9 Mil/uL (ref 4.22–5.81)
WBC: 5.5 10*3/uL (ref 4.5–10.5)

## 2012-06-30 LAB — HEPATIC FUNCTION PANEL
Albumin: 4.3 g/dL (ref 3.5–5.2)
Alkaline Phosphatase: 53 U/L (ref 39–117)
Total Bilirubin: 1.3 mg/dL — ABNORMAL HIGH (ref 0.3–1.2)

## 2012-06-30 LAB — TSH: TSH: 2.43 u[IU]/mL (ref 0.35–5.50)

## 2012-07-02 ENCOUNTER — Other Ambulatory Visit: Payer: BC Managed Care – PPO

## 2012-07-02 NOTE — Progress Notes (Signed)
Quick Note:  Pt has appointment on 07/19/12 will go over then. ______

## 2012-07-19 ENCOUNTER — Ambulatory Visit (INDEPENDENT_AMBULATORY_CARE_PROVIDER_SITE_OTHER): Payer: BC Managed Care – PPO | Admitting: Family Medicine

## 2012-07-19 ENCOUNTER — Encounter: Payer: Self-pay | Admitting: Family Medicine

## 2012-07-19 VITALS — BP 128/86 | HR 77 | Temp 97.9°F | Ht 69.25 in | Wt 255.0 lb

## 2012-07-19 DIAGNOSIS — Z0000 Encounter for general adult medical examination without abnormal findings: Secondary | ICD-10-CM

## 2012-07-20 ENCOUNTER — Encounter: Payer: Self-pay | Admitting: Family Medicine

## 2012-07-20 NOTE — Progress Notes (Signed)
  Subjective:    Patient ID: Brent Gordon, male    DOB: Jan 16, 1960, 52 y.o.   MRN: [id number]  HPI 52 yr old male for a cpx. He feels well and has no concerns.    Review of Systems  Constitutional: Negative.   HENT: Negative.   Eyes: Negative.   Respiratory: Negative.   Cardiovascular: Negative.   Gastrointestinal: Negative.   Genitourinary: Negative.   Musculoskeletal: Negative.   Skin: Negative.   Neurological: Negative.   Psychiatric/Behavioral: Negative.        Objective:   Physical Exam  Constitutional: He is oriented to person, place, and time. He appears well-developed and well-nourished. No distress.  HENT:  Head: Normocephalic and atraumatic.  Right Ear: External ear normal.  Left Ear: External ear normal.  Nose: Nose normal.  Mouth/Throat: Oropharynx is clear and moist. No oropharyngeal exudate.  Eyes: Conjunctivae and EOM are normal. Pupils are equal, round, and reactive to light. Right eye exhibits no discharge. Left eye exhibits no discharge. No scleral icterus.  Neck: Neck supple. No JVD present. No tracheal deviation present. No thyromegaly present.  Cardiovascular: Normal rate, regular rhythm, normal heart sounds and intact distal pulses.  Exam reveals no gallop and no friction rub.   No murmur heard. EKG normal   Pulmonary/Chest: Effort normal and breath sounds normal. No respiratory distress. He has no wheezes. He has no rales. He exhibits no tenderness.  Abdominal: Soft. Bowel sounds are normal. He exhibits no distension and no mass. There is no tenderness. There is no rebound and no guarding.  Genitourinary: Rectum normal, prostate normal and penis normal. Guaiac negative stool. No penile tenderness.  Musculoskeletal: Normal range of motion. He exhibits no edema and no tenderness.  Lymphadenopathy:    He has no cervical adenopathy.  Neurological: He is alert and oriented to person, place, and time. He has normal reflexes. No cranial nerve deficit. He  exhibits normal muscle tone. Coordination normal.  Skin: Skin is warm and dry. No rash noted. He is not diaphoretic. No erythema. No pallor.  Psychiatric: He has a normal mood and affect. His behavior is normal. Judgment and thought content normal.          Assessment & Plan:  Well exam.

## 2012-11-24 ENCOUNTER — Encounter: Payer: Self-pay | Admitting: Pulmonary Disease

## 2013-01-28 ENCOUNTER — Telehealth: Payer: Self-pay | Admitting: *Deleted

## 2013-01-28 NOTE — Telephone Encounter (Signed)
Error

## 2013-09-01 ENCOUNTER — Other Ambulatory Visit: Payer: Self-pay

## 2013-09-01 ENCOUNTER — Telehealth: Payer: Self-pay | Admitting: Family Medicine

## 2013-09-01 DIAGNOSIS — Z0000 Encounter for general adult medical examination without abnormal findings: Secondary | ICD-10-CM

## 2013-09-01 NOTE — Telephone Encounter (Signed)
Pt would like to go to [facility] for cpx labs. Pt is sch for cpx here on 10-18-13. Please put order in system

## 2013-09-01 NOTE — Telephone Encounter (Signed)
Labs ordered.

## 2013-09-01 NOTE — Telephone Encounter (Signed)
Pt is aware.  

## 2013-10-18 ENCOUNTER — Encounter: Payer: BC Managed Care – PPO | Admitting: Family Medicine

## 2013-10-24 ENCOUNTER — Ambulatory Visit (INDEPENDENT_AMBULATORY_CARE_PROVIDER_SITE_OTHER): Payer: BC Managed Care – PPO | Admitting: Family Medicine

## 2013-10-24 ENCOUNTER — Encounter: Payer: Self-pay | Admitting: Family Medicine

## 2013-10-24 VITALS — BP 124/75 | HR 90 | Temp 97.5°F | Ht 69.25 in | Wt 269.0 lb

## 2013-10-24 DIAGNOSIS — Z0000 Encounter for general adult medical examination without abnormal findings: Secondary | ICD-10-CM

## 2013-10-24 LAB — LIPID PANEL
Cholesterol: 124 mg/dL (ref 0–200)
HDL: 36 mg/dL — AB (ref >39.00)
LDL Cholesterol: 76 mg/dL (ref 0–99)
NonHDL: 88
TRIGLYCERIDES: 60 mg/dL (ref 0.0–149.0)
Total CHOL/HDL Ratio: 3
VLDL: 12 mg/dL (ref 0.0–40.0)

## 2013-10-24 LAB — CBC WITH DIFFERENTIAL/PLATELET
BASOS PCT: 0.4 % (ref 0.0–3.0)
Basophils Absolute: 0 10*3/uL (ref 0.0–0.1)
EOS ABS: 0.1 10*3/uL (ref 0.0–0.7)
EOS PCT: 1 % (ref 0.0–5.0)
HEMATOCRIT: 41.5 % (ref 39.0–52.0)
HEMOGLOBIN: 13.8 g/dL (ref 13.0–17.0)
LYMPHS ABS: 1.8 10*3/uL (ref 0.7–4.0)
Lymphocytes Relative: 27.9 % (ref 12.0–46.0)
MCHC: 33.2 g/dL (ref 30.0–36.0)
MCV: 88.6 fl (ref 78.0–100.0)
MONO ABS: 0.4 10*3/uL (ref 0.1–1.0)
Monocytes Relative: 6.6 % (ref 3.0–12.0)
NEUTROS ABS: 4.2 10*3/uL (ref 1.4–7.7)
Neutrophils Relative %: 64.1 % (ref 43.0–77.0)
Platelets: 250 10*3/uL (ref 150.0–400.0)
RBC: 4.69 Mil/uL (ref 4.22–5.81)
RDW: 13.9 % (ref 11.5–15.5)
WBC: 6.6 10*3/uL (ref 4.0–10.5)

## 2013-10-24 LAB — BASIC METABOLIC PANEL
BUN: 15 mg/dL (ref 6–23)
CHLORIDE: 104 meq/L (ref 96–112)
CO2: 25 meq/L (ref 19–32)
CREATININE: 0.7 mg/dL (ref 0.4–1.5)
Calcium: 9.1 mg/dL (ref 8.4–10.5)
GFR: 122.87 mL/min (ref >60.00)
Glucose, Bld: 78 mg/dL (ref 70–99)
POTASSIUM: 4.2 meq/L (ref 3.5–5.1)
SODIUM: 138 meq/L (ref 135–145)

## 2013-10-24 LAB — POCT URINALYSIS DIPSTICK
BILIRUBIN UA: NEGATIVE
GLUCOSE UA: NEGATIVE
Ketones, UA: NEGATIVE
Leukocytes, UA: NEGATIVE
Nitrite, UA: NEGATIVE
Protein, UA: NEGATIVE
RBC UA: NEGATIVE
SPEC GRAV UA: 1.02
Urobilinogen, UA: 0.2
pH, UA: 5.5

## 2013-10-24 LAB — HEPATIC FUNCTION PANEL
ALBUMIN: 3.7 g/dL (ref 3.5–5.2)
ALT: 19 U/L (ref 0–53)
AST: 20 U/L (ref 0–37)
Alkaline Phosphatase: 47 U/L (ref 39–117)
Bilirubin, Direct: 0.2 mg/dL (ref 0.0–0.3)
TOTAL PROTEIN: 6.9 g/dL (ref 6.0–8.3)
Total Bilirubin: 0.9 mg/dL (ref 0.2–1.2)

## 2013-10-24 LAB — TSH: TSH: 2.11 u[IU]/mL (ref 0.35–4.50)

## 2013-10-24 NOTE — Progress Notes (Signed)
Pre visit review using our clinic review tool, if applicable. No additional management support is needed unless otherwise documented below in the visit note. 

## 2013-10-24 NOTE — Progress Notes (Signed)
   Subjective:    Patient ID: Brent GuarneriJames Gordon, male    DOB: 07/23/1960, 53 y.o.   MRN: [id number]  HPI 53 yr old male for a cpx. He feels well. He admits to not exercising and not eating in a healthy manner.    Review of Systems  Constitutional: Negative.   HENT: Negative.   Eyes: Negative.   Respiratory: Negative.   Cardiovascular: Negative.   Gastrointestinal: Negative.   Genitourinary: Negative.   Musculoskeletal: Negative.   Skin: Negative.   Neurological: Negative.   Psychiatric/Behavioral: Negative.        Objective:   Physical Exam  Constitutional: He is oriented to person, place, and time. He appears well-developed and well-nourished. No distress.  HENT:  Head: Normocephalic and atraumatic.  Right Ear: External ear normal.  Left Ear: External ear normal.  Nose: Nose normal.  Mouth/Throat: Oropharynx is clear and moist. No oropharyngeal exudate.  Eyes: Conjunctivae and EOM are normal. Pupils are equal, round, and reactive to light. Right eye exhibits no discharge. Left eye exhibits no discharge. No scleral icterus.  Neck: Neck supple. No JVD present. No tracheal deviation present. No thyromegaly present.  Cardiovascular: Normal rate, regular rhythm, normal heart sounds and intact distal pulses.  Exam reveals no gallop and no friction rub.   No murmur heard. EKG normal   Pulmonary/Chest: Effort normal and breath sounds normal. No respiratory distress. He has no wheezes. He has no rales. He exhibits no tenderness.  Abdominal: Soft. Bowel sounds are normal. He exhibits no distension and no mass. There is no tenderness. There is no rebound and no guarding.  Genitourinary: Rectum normal, prostate normal and penis normal. Guaiac negative stool. No penile tenderness.  Musculoskeletal: Normal range of motion. He exhibits no edema and no tenderness.  Lymphadenopathy:    He has no cervical adenopathy.  Neurological: He is alert and oriented to person, place, and time. He has normal  reflexes. No cranial nerve deficit. He exhibits normal muscle tone. Coordination normal.  Skin: Skin is warm and dry. No rash noted. He is not diaphoretic. No erythema. No pallor.  Psychiatric: He has a normal mood and affect. His behavior is normal. Judgment and thought content normal.          Assessment & Plan:  Well exam. We await his lab results.

## 2014-10-20 ENCOUNTER — Other Ambulatory Visit (INDEPENDENT_AMBULATORY_CARE_PROVIDER_SITE_OTHER): Payer: BLUE CROSS/BLUE SHIELD

## 2014-10-20 DIAGNOSIS — Z0000 Encounter for general adult medical examination without abnormal findings: Secondary | ICD-10-CM | POA: Diagnosis not present

## 2014-10-20 LAB — HEPATIC FUNCTION PANEL
ALT: 31 U/L (ref 0–53)
AST: 27 U/L (ref 0–37)
Albumin: 4 g/dL (ref 3.5–5.2)
Alkaline Phosphatase: 47 U/L (ref 39–117)
BILIRUBIN TOTAL: 0.7 mg/dL (ref 0.2–1.2)
Bilirubin, Direct: 0.2 mg/dL (ref 0.0–0.3)
Total Protein: 6.4 g/dL (ref 6.0–8.3)

## 2014-10-20 LAB — CBC WITH DIFFERENTIAL/PLATELET
BASOS PCT: 0.5 % (ref 0.0–3.0)
Basophils Absolute: 0 10*3/uL (ref 0.0–0.1)
EOS PCT: 1.8 % (ref 0.0–5.0)
Eosinophils Absolute: 0.1 10*3/uL (ref 0.0–0.7)
HCT: 43 % (ref 39.0–52.0)
Hemoglobin: 14.5 g/dL (ref 13.0–17.0)
LYMPHS ABS: 1.5 10*3/uL (ref 0.7–4.0)
Lymphocytes Relative: 24.2 % (ref 12.0–46.0)
MCHC: 33.8 g/dL (ref 30.0–36.0)
MCV: 89 fl (ref 78.0–100.0)
MONO ABS: 0.4 10*3/uL (ref 0.1–1.0)
Monocytes Relative: 6.1 % (ref 3.0–12.0)
NEUTROS ABS: 4.2 10*3/uL (ref 1.4–7.7)
NEUTROS PCT: 67.4 % (ref 43.0–77.0)
PLATELETS: 251 10*3/uL (ref 150.0–400.0)
RBC: 4.82 Mil/uL (ref 4.22–5.81)
RDW: 13.8 % (ref 11.5–15.5)
WBC: 6.2 10*3/uL (ref 4.0–10.5)

## 2014-10-20 LAB — LIPID PANEL
CHOLESTEROL: 131 mg/dL (ref 0–200)
HDL: 37.9 mg/dL — AB (ref >39.00)
LDL CALC: 83 mg/dL (ref 0–99)
NonHDL: 92.8
TRIGLYCERIDES: 49 mg/dL (ref 0.0–149.0)
Total CHOL/HDL Ratio: 3
VLDL: 9.8 mg/dL (ref 0.0–40.0)

## 2014-10-20 LAB — POCT URINALYSIS DIPSTICK
Bilirubin, UA: NEGATIVE
Blood, UA: NEGATIVE
GLUCOSE UA: NEGATIVE
Ketones, UA: NEGATIVE
Leukocytes, UA: NEGATIVE
Nitrite, UA: NEGATIVE
Protein, UA: NEGATIVE
SPEC GRAV UA: 1.015
UROBILINOGEN UA: 0.2
pH, UA: 6.5

## 2014-10-20 LAB — BASIC METABOLIC PANEL
BUN: 12 mg/dL (ref 6–23)
CHLORIDE: 105 meq/L (ref 96–112)
CO2: 28 mEq/L (ref 19–32)
CREATININE: 0.67 mg/dL (ref 0.40–1.50)
Calcium: 8.8 mg/dL (ref 8.4–10.5)
GFR: 130.89 mL/min (ref >60.00)
GLUCOSE: 104 mg/dL — AB (ref 70–99)
POTASSIUM: 4.3 meq/L (ref 3.5–5.1)
Sodium: 140 mEq/L (ref 135–145)

## 2014-10-20 LAB — PSA: PSA: 1.99 ng/mL (ref 0.10–4.00)

## 2014-10-20 LAB — TSH: TSH: 2.09 u[IU]/mL (ref 0.35–4.50)

## 2014-10-25 LAB — NICOTINE SCREEN, URINE

## 2014-10-27 ENCOUNTER — Encounter: Payer: Self-pay | Admitting: Family Medicine

## 2014-11-09 ENCOUNTER — Ambulatory Visit (INDEPENDENT_AMBULATORY_CARE_PROVIDER_SITE_OTHER): Payer: BLUE CROSS/BLUE SHIELD | Admitting: Family Medicine

## 2014-11-09 ENCOUNTER — Encounter: Payer: Self-pay | Admitting: Family Medicine

## 2014-11-09 VITALS — BP 131/89 | HR 79 | Temp 98.9°F | Ht 69.25 in | Wt 272.0 lb

## 2014-11-09 DIAGNOSIS — Z0000 Encounter for general adult medical examination without abnormal findings: Secondary | ICD-10-CM

## 2014-11-09 DIAGNOSIS — R739 Hyperglycemia, unspecified: Secondary | ICD-10-CM | POA: Diagnosis not present

## 2014-11-09 LAB — HEMOGLOBIN A1C: Hgb A1c MFr Bld: 6 % (ref 4.6–6.5)

## 2014-11-09 NOTE — Progress Notes (Signed)
Pre visit review using our clinic review tool, if applicable. No additional management support is needed unless otherwise documented below in the visit note. 

## 2014-11-09 NOTE — Progress Notes (Signed)
   Subjective:    Patient ID: Brent GuarneriJames Gordon, male    DOB: 09/30/1960, 54 y.o.   MRN: [id number]  HPI 54 yr old male for a cpx. He feels well but he knows he is overweight. He gets SOB when he exerts himself but there is no chest pain. His sleep apnea is stable. His labs showed a slightly elevated glucose of 104.    Review of Systems  Constitutional: Negative.   HENT: Negative.   Eyes: Negative.   Respiratory: Negative.   Cardiovascular: Negative.   Gastrointestinal: Negative.   Genitourinary: Negative.   Musculoskeletal: Negative.   Skin: Negative.   Neurological: Negative.   Psychiatric/Behavioral: Negative.        Objective:   Physical Exam  Constitutional: He is oriented to person, place, and time. No distress.  Morbidly obese   HENT:  Head: Normocephalic and atraumatic.  Right Ear: External ear normal.  Left Ear: External ear normal.  Nose: Nose normal.  Mouth/Throat: Oropharynx is clear and moist. No oropharyngeal exudate.  Eyes: Conjunctivae and EOM are normal. Pupils are equal, round, and reactive to light. Right eye exhibits no discharge. Left eye exhibits no discharge. No scleral icterus.  Neck: Neck supple. No JVD present. No tracheal deviation present. No thyromegaly present.  Cardiovascular: Normal rate, regular rhythm, normal heart sounds and intact distal pulses.  Exam reveals no gallop and no friction rub.   No murmur heard. EKG normal   Pulmonary/Chest: Effort normal and breath sounds normal. No respiratory distress. He has no wheezes. He has no rales. He exhibits no tenderness.  Abdominal: Soft. Bowel sounds are normal. He exhibits no distension and no mass. There is no tenderness. There is no rebound and no guarding.  Genitourinary: Rectum normal, prostate normal and penis normal. Guaiac negative stool. No penile tenderness.  Musculoskeletal: Normal range of motion. He exhibits no edema or tenderness.  Lymphadenopathy:    He has no cervical adenopathy.    Neurological: He is alert and oriented to person, place, and time. He has normal reflexes. No cranial nerve deficit. He exhibits normal muscle tone. Coordination normal.  Skin: Skin is warm and dry. No rash noted. He is not diaphoretic. No erythema. No pallor.  Psychiatric: He has a normal mood and affect. His behavior is normal. Judgment and thought content normal.          Assessment & Plan:  Well exam. We discussed diet and exercise advice. We will draw a baseline A1c today. He knows that losing weight will help treat his sleep apnea and postpone the onset of diabetes.

## 2015-04-16 DIAGNOSIS — M79672 Pain in left foot: Secondary | ICD-10-CM | POA: Diagnosis not present

## 2015-04-16 DIAGNOSIS — Z9689 Presence of other specified functional implants: Secondary | ICD-10-CM | POA: Diagnosis not present

## 2015-04-18 DIAGNOSIS — J3089 Other allergic rhinitis: Secondary | ICD-10-CM | POA: Diagnosis not present

## 2015-04-25 DIAGNOSIS — J3089 Other allergic rhinitis: Secondary | ICD-10-CM | POA: Diagnosis not present

## 2015-05-01 DIAGNOSIS — J3089 Other allergic rhinitis: Secondary | ICD-10-CM | POA: Diagnosis not present

## 2015-05-03 DIAGNOSIS — J3089 Other allergic rhinitis: Secondary | ICD-10-CM | POA: Diagnosis not present

## 2015-05-10 DIAGNOSIS — J3089 Other allergic rhinitis: Secondary | ICD-10-CM | POA: Diagnosis not present

## 2015-05-16 DIAGNOSIS — J3089 Other allergic rhinitis: Secondary | ICD-10-CM | POA: Diagnosis not present

## 2015-05-24 DIAGNOSIS — J3089 Other allergic rhinitis: Secondary | ICD-10-CM | POA: Diagnosis not present

## 2015-05-30 DIAGNOSIS — J3089 Other allergic rhinitis: Secondary | ICD-10-CM | POA: Diagnosis not present

## 2015-06-01 DIAGNOSIS — J3089 Other allergic rhinitis: Secondary | ICD-10-CM | POA: Diagnosis not present

## 2015-06-07 DIAGNOSIS — J3089 Other allergic rhinitis: Secondary | ICD-10-CM | POA: Diagnosis not present

## 2015-06-20 DIAGNOSIS — J3089 Other allergic rhinitis: Secondary | ICD-10-CM | POA: Diagnosis not present

## 2015-06-27 DIAGNOSIS — J3089 Other allergic rhinitis: Secondary | ICD-10-CM | POA: Diagnosis not present

## 2015-07-11 DIAGNOSIS — J3089 Other allergic rhinitis: Secondary | ICD-10-CM | POA: Diagnosis not present

## 2015-07-13 DIAGNOSIS — J3089 Other allergic rhinitis: Secondary | ICD-10-CM | POA: Diagnosis not present

## 2015-07-19 DIAGNOSIS — J3089 Other allergic rhinitis: Secondary | ICD-10-CM | POA: Diagnosis not present

## 2015-07-25 DIAGNOSIS — J3089 Other allergic rhinitis: Secondary | ICD-10-CM | POA: Diagnosis not present

## 2015-08-01 DIAGNOSIS — J3089 Other allergic rhinitis: Secondary | ICD-10-CM | POA: Diagnosis not present

## 2015-08-10 DIAGNOSIS — J3089 Other allergic rhinitis: Secondary | ICD-10-CM | POA: Diagnosis not present

## 2015-08-15 DIAGNOSIS — J3089 Other allergic rhinitis: Secondary | ICD-10-CM | POA: Diagnosis not present

## 2015-08-22 DIAGNOSIS — J3089 Other allergic rhinitis: Secondary | ICD-10-CM | POA: Diagnosis not present

## 2015-09-05 DIAGNOSIS — J3089 Other allergic rhinitis: Secondary | ICD-10-CM | POA: Diagnosis not present

## 2015-09-12 DIAGNOSIS — J3089 Other allergic rhinitis: Secondary | ICD-10-CM | POA: Diagnosis not present

## 2015-10-01 DIAGNOSIS — J3089 Other allergic rhinitis: Secondary | ICD-10-CM | POA: Diagnosis not present

## 2015-10-12 DIAGNOSIS — J3089 Other allergic rhinitis: Secondary | ICD-10-CM | POA: Diagnosis not present

## 2015-10-24 ENCOUNTER — Telehealth: Payer: Self-pay | Admitting: Family Medicine

## 2015-10-24 DIAGNOSIS — J3089 Other allergic rhinitis: Secondary | ICD-10-CM | POA: Diagnosis not present

## 2015-10-24 NOTE — Telephone Encounter (Signed)
Pt would like to have cpx labs drawn at [facility]. Pt is schedule for cpx in nov 2017

## 2015-10-25 NOTE — Telephone Encounter (Signed)
lmom for pt to call and schedule lab here per Dr. Clent RidgesFry

## 2015-10-25 NOTE — Telephone Encounter (Signed)
Per Dr. Clent RidgesFry it is recommended that pt have labs done here in our office and he will need to schedule this.

## 2015-10-26 DIAGNOSIS — J3089 Other allergic rhinitis: Secondary | ICD-10-CM | POA: Diagnosis not present

## 2015-10-31 ENCOUNTER — Other Ambulatory Visit: Payer: Self-pay | Admitting: Family Medicine

## 2015-10-31 DIAGNOSIS — R739 Hyperglycemia, unspecified: Secondary | ICD-10-CM

## 2015-10-31 DIAGNOSIS — Z0000 Encounter for general adult medical examination without abnormal findings: Secondary | ICD-10-CM

## 2015-10-31 NOTE — Telephone Encounter (Signed)
Pt states he will have to call back, because he is trying to work around a work schedule.this is a hardship for him to get by our office and [facility] is more convenient.

## 2015-10-31 NOTE — Telephone Encounter (Signed)
Per Dr. Clent RidgesFry okay to put in orders for [name]. I put in future lab order for [name] location and left pt a voice message with this information.

## 2015-10-31 NOTE — Telephone Encounter (Signed)
lmom for pt to call back

## 2015-11-01 DIAGNOSIS — J3089 Other allergic rhinitis: Secondary | ICD-10-CM | POA: Diagnosis not present

## 2015-11-07 DIAGNOSIS — J3089 Other allergic rhinitis: Secondary | ICD-10-CM | POA: Diagnosis not present

## 2015-11-07 DIAGNOSIS — H1045 Other chronic allergic conjunctivitis: Secondary | ICD-10-CM | POA: Diagnosis not present

## 2015-11-07 DIAGNOSIS — R0602 Shortness of breath: Secondary | ICD-10-CM | POA: Diagnosis not present

## 2015-11-14 DIAGNOSIS — J3089 Other allergic rhinitis: Secondary | ICD-10-CM | POA: Diagnosis not present

## 2015-11-15 ENCOUNTER — Other Ambulatory Visit (INDEPENDENT_AMBULATORY_CARE_PROVIDER_SITE_OTHER): Payer: BLUE CROSS/BLUE SHIELD

## 2015-11-15 DIAGNOSIS — Z0000 Encounter for general adult medical examination without abnormal findings: Secondary | ICD-10-CM | POA: Diagnosis not present

## 2015-11-15 DIAGNOSIS — R739 Hyperglycemia, unspecified: Secondary | ICD-10-CM | POA: Diagnosis not present

## 2015-11-15 LAB — URINALYSIS
Bilirubin Urine: NEGATIVE
HGB URINE DIPSTICK: NEGATIVE
KETONES UR: NEGATIVE
LEUKOCYTES UA: NEGATIVE
Nitrite: NEGATIVE
Specific Gravity, Urine: 1.015 (ref 1.000–1.030)
Total Protein, Urine: NEGATIVE
URINE GLUCOSE: NEGATIVE
UROBILINOGEN UA: 0.2 (ref 0.0–1.0)
pH: 5 (ref 5.0–8.0)

## 2015-11-15 LAB — CBC WITH DIFFERENTIAL/PLATELET
BASOS ABS: 0 10*3/uL (ref 0.0–0.1)
Basophils Relative: 0.5 % (ref 0.0–3.0)
EOS ABS: 0.2 10*3/uL (ref 0.0–0.7)
EOS PCT: 2.7 % (ref 0.0–5.0)
HCT: 43.7 % (ref 39.0–52.0)
HEMOGLOBIN: 14.7 g/dL (ref 13.0–17.0)
Lymphocytes Relative: 27.9 % (ref 12.0–46.0)
Lymphs Abs: 1.7 10*3/uL (ref 0.7–4.0)
MCHC: 33.7 g/dL (ref 30.0–36.0)
MCV: 88.5 fl (ref 78.0–100.0)
MONO ABS: 0.5 10*3/uL (ref 0.1–1.0)
Monocytes Relative: 8.2 % (ref 3.0–12.0)
Neutro Abs: 3.7 10*3/uL (ref 1.4–7.7)
Neutrophils Relative %: 60.7 % (ref 43.0–77.0)
Platelets: 253 10*3/uL (ref 150.0–400.0)
RBC: 4.94 Mil/uL (ref 4.22–5.81)
RDW: 13.9 % (ref 11.5–15.5)
WBC: 6 10*3/uL (ref 4.0–10.5)

## 2015-11-15 LAB — BASIC METABOLIC PANEL
BUN: 17 mg/dL (ref 6–23)
CHLORIDE: 107 meq/L (ref 96–112)
CO2: 27 meq/L (ref 19–32)
Calcium: 9.1 mg/dL (ref 8.4–10.5)
Creatinine, Ser: 0.75 mg/dL (ref 0.40–1.50)
GFR: 114.47 mL/min (ref >60.00)
GLUCOSE: 102 mg/dL — AB (ref 70–99)
POTASSIUM: 4.3 meq/L (ref 3.5–5.1)
SODIUM: 142 meq/L (ref 135–145)

## 2015-11-15 LAB — LIPID PANEL
CHOL/HDL RATIO: 3
Cholesterol: 123 mg/dL (ref 0–200)
HDL: 39.1 mg/dL (ref >39.00)
LDL CALC: 70 mg/dL (ref 0–99)
NonHDL: 83.45
TRIGLYCERIDES: 69 mg/dL (ref 0.0–149.0)
VLDL: 13.8 mg/dL (ref 0.0–40.0)

## 2015-11-15 LAB — TSH: TSH: 4.24 u[IU]/mL (ref 0.35–4.50)

## 2015-11-15 LAB — HEPATIC FUNCTION PANEL
ALBUMIN: 4.1 g/dL (ref 3.5–5.2)
ALT: 17 U/L (ref 0–53)
AST: 16 U/L (ref 0–37)
Alkaline Phosphatase: 46 U/L (ref 39–117)
Bilirubin, Direct: 0.2 mg/dL (ref 0.0–0.3)
TOTAL PROTEIN: 6.3 g/dL (ref 6.0–8.3)
Total Bilirubin: 0.8 mg/dL (ref 0.2–1.2)

## 2015-11-15 LAB — PSA: PSA: 3 ng/mL (ref 0.10–4.00)

## 2015-11-15 LAB — HEMOGLOBIN A1C: HEMOGLOBIN A1C: 6.1 % (ref 4.6–6.5)

## 2015-11-20 ENCOUNTER — Encounter: Payer: Self-pay | Admitting: Family Medicine

## 2015-11-20 ENCOUNTER — Ambulatory Visit (INDEPENDENT_AMBULATORY_CARE_PROVIDER_SITE_OTHER): Payer: BLUE CROSS/BLUE SHIELD | Admitting: Family Medicine

## 2015-11-20 VITALS — BP 120/80 | Temp 98.2°F | Ht 69.25 in | Wt 275.0 lb

## 2015-11-20 DIAGNOSIS — J3089 Other allergic rhinitis: Secondary | ICD-10-CM | POA: Diagnosis not present

## 2015-11-20 DIAGNOSIS — Z0000 Encounter for general adult medical examination without abnormal findings: Secondary | ICD-10-CM

## 2015-11-20 MED ORDER — TAMSULOSIN HCL 0.4 MG PO CAPS
0.4000 mg | ORAL_CAPSULE | Freq: Every day | ORAL | 3 refills | Status: DC
Start: 2015-11-20 — End: 2016-12-05

## 2015-11-20 NOTE — Progress Notes (Signed)
   Subjective:    Patient ID: Brent Gordon, male    DOB: 03/03/1960, 55 y.o.   MRN: [id number]  HPI 55 yr old male for a well exam. He feels well but he knows he is overweight.    Review of Systems  Constitutional: Negative.   HENT: Negative.   Eyes: Negative.   Respiratory: Negative.   Cardiovascular: Negative.   Gastrointestinal: Negative.   Genitourinary: Negative.   Musculoskeletal: Negative.   Skin: Negative.   Neurological: Negative.   Psychiatric/Behavioral: Negative.        Objective:   Physical Exam  Constitutional: He is oriented to person, place, and time. He appears well-developed and well-nourished. No distress.  HENT:  Head: Normocephalic and atraumatic.  Right Ear: External ear normal.  Left Ear: External ear normal.  Nose: Nose normal.  Mouth/Throat: Oropharynx is clear and moist. No oropharyngeal exudate.  Eyes: Conjunctivae and EOM are normal. Pupils are equal, round, and reactive to light. Right eye exhibits no discharge. Left eye exhibits no discharge. No scleral icterus.  Neck: Neck supple. No JVD present. No tracheal deviation present. No thyromegaly present.  Cardiovascular: Normal rate, regular rhythm, normal heart sounds and intact distal pulses.  Exam reveals no gallop and no friction rub.   No murmur heard. EKG normal   Pulmonary/Chest: Effort normal and breath sounds normal. No respiratory distress. He has no wheezes. He has no rales. He exhibits no tenderness.  Abdominal: Soft. Bowel sounds are normal. He exhibits no distension and no mass. There is no tenderness. There is no rebound and no guarding.  Genitourinary: Rectum normal, prostate normal and penis normal. Rectal exam shows guaiac negative stool. No penile tenderness.  Musculoskeletal: Normal range of motion. He exhibits no edema or tenderness.  Lymphadenopathy:    He has no cervical adenopathy.  Neurological: He is alert and oriented to person, place, and time. He has normal reflexes. No  cranial nerve deficit. He exhibits normal muscle tone. Coordination normal.  Skin: Skin is warm and dry. No rash noted. He is not diaphoretic. No erythema. No pallor.  Psychiatric: He has a normal mood and affect. His behavior is normal. Judgment and thought content normal.          Assessment & Plan:  Well exam. We discussed diet and exercise.  Nelwyn SalisburyFRY,STEPHEN A, MD

## 2015-11-20 NOTE — Progress Notes (Signed)
Pre visit review using our clinic review tool, if applicable. No additional management support is needed unless otherwise documented below in the visit note. 

## 2015-12-12 DIAGNOSIS — J3089 Other allergic rhinitis: Secondary | ICD-10-CM | POA: Diagnosis not present

## 2015-12-27 DIAGNOSIS — J3089 Other allergic rhinitis: Secondary | ICD-10-CM | POA: Diagnosis not present

## 2016-01-08 DIAGNOSIS — J3089 Other allergic rhinitis: Secondary | ICD-10-CM | POA: Diagnosis not present

## 2016-01-16 DIAGNOSIS — J3089 Other allergic rhinitis: Secondary | ICD-10-CM | POA: Diagnosis not present

## 2016-01-18 DIAGNOSIS — J3089 Other allergic rhinitis: Secondary | ICD-10-CM | POA: Diagnosis not present

## 2016-01-23 DIAGNOSIS — J3089 Other allergic rhinitis: Secondary | ICD-10-CM | POA: Diagnosis not present

## 2016-01-25 DIAGNOSIS — J3089 Other allergic rhinitis: Secondary | ICD-10-CM | POA: Diagnosis not present

## 2016-02-01 DIAGNOSIS — J3089 Other allergic rhinitis: Secondary | ICD-10-CM | POA: Diagnosis not present

## 2016-02-15 DIAGNOSIS — J3089 Other allergic rhinitis: Secondary | ICD-10-CM | POA: Diagnosis not present

## 2016-02-22 DIAGNOSIS — J3089 Other allergic rhinitis: Secondary | ICD-10-CM | POA: Diagnosis not present

## 2016-03-05 DIAGNOSIS — J3089 Other allergic rhinitis: Secondary | ICD-10-CM | POA: Diagnosis not present

## 2016-03-14 DIAGNOSIS — J3089 Other allergic rhinitis: Secondary | ICD-10-CM | POA: Diagnosis not present

## 2016-03-21 DIAGNOSIS — J3089 Other allergic rhinitis: Secondary | ICD-10-CM | POA: Diagnosis not present

## 2016-03-26 DIAGNOSIS — J3089 Other allergic rhinitis: Secondary | ICD-10-CM | POA: Diagnosis not present

## 2016-04-02 DIAGNOSIS — J3089 Other allergic rhinitis: Secondary | ICD-10-CM | POA: Diagnosis not present

## 2016-04-09 DIAGNOSIS — J3089 Other allergic rhinitis: Secondary | ICD-10-CM | POA: Diagnosis not present

## 2016-04-16 DIAGNOSIS — J3089 Other allergic rhinitis: Secondary | ICD-10-CM | POA: Diagnosis not present

## 2016-04-23 DIAGNOSIS — J3089 Other allergic rhinitis: Secondary | ICD-10-CM | POA: Diagnosis not present

## 2016-04-30 DIAGNOSIS — J3089 Other allergic rhinitis: Secondary | ICD-10-CM | POA: Diagnosis not present

## 2016-05-09 DIAGNOSIS — J3089 Other allergic rhinitis: Secondary | ICD-10-CM | POA: Diagnosis not present

## 2016-05-15 DIAGNOSIS — J3089 Other allergic rhinitis: Secondary | ICD-10-CM | POA: Diagnosis not present

## 2016-05-16 ENCOUNTER — Encounter: Payer: Self-pay | Admitting: Sports Medicine

## 2016-05-16 ENCOUNTER — Ambulatory Visit: Payer: Self-pay

## 2016-05-16 ENCOUNTER — Ambulatory Visit (INDEPENDENT_AMBULATORY_CARE_PROVIDER_SITE_OTHER): Payer: BLUE CROSS/BLUE SHIELD | Admitting: Sports Medicine

## 2016-05-16 DIAGNOSIS — M25562 Pain in left knee: Secondary | ICD-10-CM

## 2016-05-16 DIAGNOSIS — M25569 Pain in unspecified knee: Secondary | ICD-10-CM | POA: Insufficient documentation

## 2016-05-16 NOTE — Patient Instructions (Signed)
Please perform the exercise program that Brent Gordon has prepared for you and gone over in detail on a daily basis.  In addition to the handout you were provided you can access your program through: [email or web address]   Your unique program code is: [email or web address]  I recommend you obtained a compression sleeve to help with your joint problems. There are many options on the market however I recommend obtaining a FULL KNEE Body Helix compression sleeve.  You can find information (including how to appropriate measure yourself for sizing) can be found at [email or web address].  Many of these products are health savings account (HSA) eligible.   You can use the compression sleeve at any time throughout the day but is most important to use while being active as well as for 2 hours post-activity.   It is appropriate to ice following activity with the compression sleeve in place.

## 2016-05-16 NOTE — Progress Notes (Signed)
OFFICE VISIT NOTE Brent Gordon. Brent Gordon [facility] at [facility] [phone]  Brent Gordon - 56 y.o. male MRN [id number]  Date of birth: 1960/06/20  Visit Date: 05/16/2016  PCP: Nelwyn Salisbury, MD   Referred by: Nelwyn Salisbury, MD  Clovis Cao, cma acting as scribe for Dr. Berline Chough.  SUBJECTIVE:   Chief Complaint  Patient presents with  . Knee Pain   HPI: As below and per problem based documentation when appropriate.  Brent Gordon has a x3 day HX of lateral LT knee pain. Associated sx swelling and tightness. Changing from a a sitting to standing position and walking trigger the sharp pain. There is a constant dull pain with inactivity. Pain will radiate down to let foot at times. No grinding or popping. No prior imaging to the area.Has a 3-4 year HX of LT foot surgery--triple arthrodesis and arch was built at that time as well. He does have trouble with numbness in that foot. [facility] did perform surgery.     Review of Systems  Constitutional: Negative.   HENT: Negative.   Eyes: Negative.   Respiratory: Negative.   Cardiovascular: Negative.   Gastrointestinal: Negative.   Genitourinary: Negative.   Musculoskeletal: Positive for joint pain.  Skin: Negative.   Neurological: Negative.   Endo/Heme/Allergies: Negative.   Psychiatric/Behavioral: Negative.     Otherwise per HPI.  HISTORY & PERTINENT PRIOR DATA:  No specialty comments available. He reports that he has never smoked. He has never used smokeless tobacco.   Recent Labs  11/15/15 0721  HGBA1C 6.1   Medications & Allergies reviewed per EMR Patient Active Problem List   Diagnosis Date Noted  . Knee pain 05/16/2016  . OBSTRUCTIVE SLEEP APNEA 10/16/2006  . LOW BACK PAIN 09/24/2006   Past Medical History:  Diagnosis Date  . Obesity   . Pain, low back   . Sleep apnea    PSG 12/01/06 see's Dr Coralyn Helling  . Varicose veins    Family History  Problem Relation Age of  Onset  . Cancer Mother        colon  . Diabetes Mother   . Arthritis Unknown   . Hypertension Unknown   . Cancer Unknown        lung  . Diabetes Maternal Grandmother    Past Surgical History:  Procedure Laterality Date  . COLONOSCOPY  04-19-12   Dr. Carman Ching, sigmoid diverticulosis & internal hemorrhoids repeat in 5 yrs  . FOOT SURGERY Left 09-26-11   triple arthrodesis per Dr. Toni Arthurs  . HERNIA REPAIR  2005/2006   Dr. Abbey Chatters  . leg vein ablation     right lower, Dr. Consuela Mimes  . TONSILLECTOMY  1967   Social History   Occupational History  . Not on file.   Social History Main Topics  . Smoking status: Never Smoker  . Smokeless tobacco: Never Used  . Alcohol use No  . Drug use: No  . Sexual activity: Not on file    OBJECTIVE:  VS:  HT:5' 9.25" (175.9 cm)   WT:279 lb (126.6 kg)  BMI:41    BP:120/90  HR:90bpm  TEMP: ( )  RESP:97 % EXAM: Findings:  WDWN, NAD, Non-toxic appearing Alert & appropriately interactive Not depressed or anxious appearing No increased work of breathing. Pupils are equal. EOM intact without nystagmus No clubbing or cyanosis of the extremities appreciated No significant rashes/lesions/ulcerations overlying the examined area. DP & PT pulses 2+/4.  No significant pretibial edema.  Sensation intact to light touch in lower extremities.  Left Knee: Overall joint is well aligned, no significant deformity.   No significant effusion.   ROM: 0 to 120.   Extensor mechanism intact Generalized TTP along the medial and lateral joint lines, worse over the medial aspect.Marland Kitchen.   2-3 mm opening with varus and valgus stressing this is a solid endpoint.  Stable anterior/posterior drawer.   Pain with McMurray's      No results found. ASSESSMENT & PLAN:   Problem List Items Addressed This Visit    Knee pain    Suspect underlying degenerative changes.  Injection today.  Body Helix compression sleeve recommended.  Therapeutic  exercises working on neuromuscular control and hip abduction strengthening and VMO strengthening reviewed in detail with the athletic training staff.  +++++++++++++++++++++++++++++++++++++++++++++++++++++++++++++++ PROCEDURE NOTE: THERAPEUTIC EXERCISES (97110) 15 minutes spent for Therapeutic exercises as stated in above notes.  This included exercises focusing on stretching, strengthening, with significant focus on eccentric aspects.   Proper technique shown and discussed handout in great detail with ATC.  All questions were discussed and answered.   ++++++++++++++++++++++++++++++++++++++++++++ PROCEDURE NOTE - ULTRASOUND GUIDED ASPIRATION & INJECTION: Left Knee Images were obtained and interpreted by myself, Gaspar BiddingMichael May Manrique, DO  Images have been saved and stored to PACS system. Images obtained on: GE S7 Ultrasound machine  DESCRIPTION OF PROCEDURE:  The patient's clinical condition is marked by substantial pain and/or significant functional disability. Other conservative therapy has not provided relief, is contraindicated, or not appropriate. There is a reasonable likelihood that injection will significantly improve the patient's pain and/or functional impairment. After discussing the risks, benefits and expected outcomes of the injection and all questions were reviewed and answered, the patient wished to undergo the above named procedure. Verbal consent was obtained. The ultrasound was used to identify the target structure and adjacent neurovascular structures. The skin was then prepped in sterile fashion and the target structure was injected under direct visualization using sterile technique as below: PREP: Alcohol, Ethel Chloride APPROACH: Superiolateral, stopcock technique, 21g 2" needle INJECTATE: 3cc 1% lidocaine, 2cc 0.5% marcaine, 2cc 40mg  DepoMedrol ASPIRATE: N/A DRESSING: Band-Aid  Post procedural instructions including recommending icing and warning signs for infection were  reviewed. This procedure was well tolerated and there were no complications.   IMPRESSION: Succesful US Guided Aspiration & injection        Relevant Orders   US LIMITED JOINT SPACE STRUCTURES LOW LEFT(NO LINKED CHARGES)      Follow-up: Return in about 6 weeks (around 06/27/2016).   CMA/ATC served as Neurosurgeonscribe during this visit. History, Physical, and Plan performed by medical provider. Documentation and orders reviewed and attested to.      Gaspar BiddingMichael Castin Donaghue, DO    [facility] Phys[facility]

## 2016-05-20 DIAGNOSIS — J3089 Other allergic rhinitis: Secondary | ICD-10-CM | POA: Diagnosis not present

## 2016-05-22 DIAGNOSIS — J3089 Other allergic rhinitis: Secondary | ICD-10-CM | POA: Diagnosis not present

## 2016-06-05 ENCOUNTER — Ambulatory Visit: Payer: BLUE CROSS/BLUE SHIELD | Admitting: Family Medicine

## 2016-06-05 DIAGNOSIS — J3089 Other allergic rhinitis: Secondary | ICD-10-CM | POA: Diagnosis not present

## 2016-06-11 DIAGNOSIS — J3089 Other allergic rhinitis: Secondary | ICD-10-CM | POA: Diagnosis not present

## 2016-06-14 NOTE — Assessment & Plan Note (Addendum)
Suspect underlying degenerative changes.  Injection today.  Body Helix compression sleeve recommended.  Therapeutic exercises working on neuromuscular control and hip abduction strengthening and VMO strengthening reviewed in detail with the athletic training staff.  +++++++++++++++++++++++++++++++++++++++++++++++++++++++++++++++ PROCEDURE NOTE: THERAPEUTIC EXERCISES (97110) 15 minutes spent for Therapeutic exercises as stated in above notes.  This included exercises focusing on stretching, strengthening, with significant focus on eccentric aspects.   Proper technique shown and discussed handout in great detail with ATC.  All questions were discussed and answered.   ++++++++++++++++++++++++++++++++++++++++++++ PROCEDURE NOTE - ULTRASOUND GUIDED ASPIRATION & INJECTION: Left Knee Images were obtained and interpreted by myself, Gaspar BiddingMichael Rigby, DO  Images have been saved and stored to PACS system. Images obtained on: GE S7 Ultrasound machine  DESCRIPTION OF PROCEDURE:  The patient's clinical condition is marked by substantial pain and/or significant functional disability. Other conservative therapy has not provided relief, is contraindicated, or not appropriate. There is a reasonable likelihood that injection will significantly improve the patient's pain and/or functional impairment. After discussing the risks, benefits and expected outcomes of the injection and all questions were reviewed and answered, the patient wished to undergo the above named procedure. Verbal consent was obtained. The ultrasound was used to identify the target structure and adjacent neurovascular structures. The skin was then prepped in sterile fashion and the target structure was injected under direct visualization using sterile technique as below: PREP: Alcohol, Ethel Chloride APPROACH: Superiolateral, stopcock technique, 21g 2" needle INJECTATE: 3cc 1% lidocaine, 2cc 0.5% marcaine, 2cc 40mg  DepoMedrol ASPIRATE:  N/A DRESSING: Band-Aid  Post procedural instructions including recommending icing and warning signs for infection were reviewed. This procedure was well tolerated and there were no complications.   IMPRESSION: Succesful US Guided Aspiration & injection

## 2016-06-20 ENCOUNTER — Ambulatory Visit (INDEPENDENT_AMBULATORY_CARE_PROVIDER_SITE_OTHER): Payer: BLUE CROSS/BLUE SHIELD | Admitting: Sports Medicine

## 2016-06-20 ENCOUNTER — Encounter: Payer: Self-pay | Admitting: Sports Medicine

## 2016-06-20 DIAGNOSIS — M5410 Radiculopathy, site unspecified: Secondary | ICD-10-CM | POA: Diagnosis not present

## 2016-06-20 DIAGNOSIS — G8929 Other chronic pain: Secondary | ICD-10-CM

## 2016-06-20 DIAGNOSIS — M5441 Lumbago with sciatica, right side: Secondary | ICD-10-CM

## 2016-06-20 DIAGNOSIS — M5442 Lumbago with sciatica, left side: Secondary | ICD-10-CM

## 2016-06-20 DIAGNOSIS — J3089 Other allergic rhinitis: Secondary | ICD-10-CM | POA: Diagnosis not present

## 2016-06-20 MED ORDER — NAPROXEN-ESOMEPRAZOLE 500-20 MG PO TBEC: 1.0000 | DELAYED_RELEASE_TABLET | Freq: Two times a day (BID) | ORAL | 0 refills | Status: AC

## 2016-06-20 MED ORDER — NAPROXEN-ESOMEPRAZOLE 500-20 MG PO TBEC: 1.0000 | DELAYED_RELEASE_TABLET | Freq: Two times a day (BID) | ORAL | 2 refills | Status: DC

## 2016-06-20 NOTE — Patient Instructions (Addendum)
Please perform the exercise program that Fayrene FearingJames has prepared for you and gone over in detail on a daily basis.  In addition to the handout you were provided you can access your program through: [email or web address]   Your unique program code is: [email or web address]

## 2016-06-20 NOTE — Progress Notes (Addendum)
OFFICE VISIT NOTE Brent Gordon. Brent Gordon Sports Medicine [facility] at [facility] [phone]  Brent Gordon - 56 y.o. male MRN [id number]  Date of birth: [birth year]  Visit Date: 06/20/2016  PCP: Brent Salisbury, MD   Referred by: Brent Salisbury, MD  Brent Gordon, CMA acting as scribe for Brent Gordon.  SUBJECTIVE:   Chief Complaint  Patient presents with  . Follow-up    left knee and right leg   HPI: As below and per problem based documentation when appropriate.  Pt presents today in follow-up of left knee pain. Pain is worse on the lateral aspect of the knee. Pt had steroid injection about 1 month ago. He reports that his knee is feeling a little better but he is still having occasional sharp pains. Pain seems to be worse when being seated for long periods of time. He is not having much trouble when standing anymore. He is having some tingling in right foot and both legs. Pt reports that pain is currently about 2/10.   Pt has concerns about possible blood clot in the right leg. He has some cramping. He has not noticed any swelling I his right leg. Pt denies increased warmth and redness. The leg is not tender to palpation. Pt describes the pain as "tightness". Pt has history of varicose veins.   Pt denies fever, chills, night sweats, unintentional weight loss or weight gain.     Review of Systems  Constitutional: Negative for chills and fever.  Respiratory: Negative for shortness of breath and wheezing.   Cardiovascular: Negative for chest pain, palpitations and leg swelling.  Musculoskeletal: Positive for joint pain and myalgias. Negative for falls.  Neurological: Positive for dizziness and tingling. Negative for headaches.  Endo/Heme/Allergies: Does not bruise/bleed easily.    Otherwise per HPI.  HISTORY & PERTINENT PRIOR DATA:  No specialty comments available. He reports that he has never smoked. He has never used smokeless tobacco.   Recent Labs  11/15/15 0721  HGBA1C 6.1   Medications & Allergies reviewed per EMR Patient Active Problem List   Diagnosis Date Noted  . Knee pain 05/16/2016  . OBSTRUCTIVE SLEEP APNEA 10/16/2006  . Acute low back pain with radicular symptoms, duration less than 6 weeks 09/24/2006   Past Medical History:  Diagnosis Date  . Obesity   . Pain, low back   . Sleep apnea    PSG 12/01/06 see's Brent Gordon  . Varicose veins    Family History  Problem Relation Age of Onset  . Cancer Mother        colon  . Diabetes Mother   . Arthritis Unknown   . Hypertension Unknown   . Cancer Unknown        lung  . Diabetes Maternal Grandmother    Past Surgical History:  Procedure Laterality Date  . COLONOSCOPY  04-19-12   Brent Gordon, sigmoid diverticulosis & internal hemorrhoids repeat in 5 yrs  . FOOT SURGERY Left 09-26-11   triple arthrodesis per Brent Gordon  . HERNIA REPAIR  2005/2006   Brent Gordon  . leg vein ablation     right lower, Brent Gordon  . TONSILLECTOMY  1967   Social History   Occupational History  . Not on file.   Social History Main Topics  . Smoking status: Never Smoker  . Smokeless tobacco: Never Used  . Alcohol use No  . Drug use: No  . Sexual activity: Not on  file    OBJECTIVE:  VS:  HT:5' 9.25" (175.9 cm)   WT:281 lb 9.6 oz (127.7 kg)  BMI:41.4    BP:122/88  HR:80bpm  TEMP: ( )  RESP:94 % EXAM: Findings:  WDWN, NAD, Non-toxic appearing Alert & appropriately interactive Not depressed or anxious appearing No increased work of breathing. Pupils are equal. EOM intact without nystagmus No clubbing or cyanosis of the extremities appreciated No significant rashes/lesions/ulcerations overlying the examined area. DP & PT pulses 2+/4.  No significant pretibial edema. Sensation intact to light touch in lower extremities.  Back & Lower Extremities: Tight straight leg raise without radicular component No significant midline tenderness.   Right  PSIS and SI joint TTP. Good internal and external rotation of the hips. Patient is able to heel and toe walk without significant difficulty.  Manual muscle testing is 5+/5 in BLE myotomes without focality Lower extremity DTRs 2+/4 diffusely and symmetric      No results found. ASSESSMENT & PLAN:   Problem List Items Addressed This Visit    Acute low back pain with radicular symptoms, duration less than 6 weeks    No red flag symptoms today. Therapeutic exercises including core conditioning program reviewed in detail with the athletic training staff. Patient is not interested in any other medications other than anti-inflammatories and scription for Vimovo provided as well as samples. If any lack of improvement 6-week follow-up will need plain film x-rays of his lumbar spine at that time.  +++++++++++++++++++++++++++++++++++++++++++++++++++++++++++++++ PROCEDURE NOTE: THERAPEUTIC EXERCISES (97110) 15 minutes spent for Therapeutic exercises as stated in above notes.  This included exercises focusing on stretching, strengthening, with significant focus on eccentric aspects.   Proper technique shown and discussed handout in great detail with ATC.  All questions were discussed and answered.        Relevant Medications   Naproxen-Esomeprazole (VIMOVO) 500-20 MG TBEC      Follow-up: Return in about 6 weeks (around 08/01/2016).   CMA/ATC served as Neurosurgeonscribe during this visit. History, Physical, and Plan performed by medical provider. Documentation and orders reviewed and attested to.      Brent BiddingMichael Patsie Mccardle, DO    Brent Gordon

## 2016-06-27 ENCOUNTER — Ambulatory Visit: Payer: BLUE CROSS/BLUE SHIELD | Admitting: Sports Medicine

## 2016-06-27 DIAGNOSIS — J3089 Other allergic rhinitis: Secondary | ICD-10-CM | POA: Diagnosis not present

## 2016-06-30 NOTE — Assessment & Plan Note (Addendum)
No red flag symptoms today. Therapeutic exercises including core conditioning program reviewed in detail with the athletic training staff. Patient is not interested in any other medications other than anti-inflammatories and scription for Vimovo provided as well as samples. If any lack of improvement 6-week follow-up will need plain film x-rays of his lumbar spine at that time.  +++++++++++++++++++++++++++++++++++++++++++++++++++++++++++++++ PROCEDURE NOTE: THERAPEUTIC EXERCISES (97110) 15 minutes spent for Therapeutic exercises as stated in above notes.  This included exercises focusing on stretching, strengthening, with significant focus on eccentric aspects.   Proper technique shown and discussed handout in great detail with ATC.  All questions were discussed and answered.

## 2016-07-11 DIAGNOSIS — J3089 Other allergic rhinitis: Secondary | ICD-10-CM | POA: Diagnosis not present

## 2016-07-18 DIAGNOSIS — J3089 Other allergic rhinitis: Secondary | ICD-10-CM | POA: Diagnosis not present

## 2016-07-25 DIAGNOSIS — J3089 Other allergic rhinitis: Secondary | ICD-10-CM | POA: Diagnosis not present

## 2016-07-30 DIAGNOSIS — J3089 Other allergic rhinitis: Secondary | ICD-10-CM | POA: Diagnosis not present

## 2016-08-01 ENCOUNTER — Ambulatory Visit: Payer: BLUE CROSS/BLUE SHIELD | Admitting: Sports Medicine

## 2016-08-06 DIAGNOSIS — J3089 Other allergic rhinitis: Secondary | ICD-10-CM | POA: Diagnosis not present

## 2016-08-13 DIAGNOSIS — J3089 Other allergic rhinitis: Secondary | ICD-10-CM | POA: Diagnosis not present

## 2016-08-18 ENCOUNTER — Encounter: Payer: Self-pay | Admitting: Sports Medicine

## 2016-08-18 ENCOUNTER — Ambulatory Visit (INDEPENDENT_AMBULATORY_CARE_PROVIDER_SITE_OTHER): Payer: BLUE CROSS/BLUE SHIELD

## 2016-08-18 ENCOUNTER — Ambulatory Visit (INDEPENDENT_AMBULATORY_CARE_PROVIDER_SITE_OTHER): Payer: BLUE CROSS/BLUE SHIELD | Admitting: Sports Medicine

## 2016-08-18 VITALS — BP 132/86 | HR 70 | Ht 68.25 in | Wt 278.6 lb

## 2016-08-18 DIAGNOSIS — M1712 Unilateral primary osteoarthritis, left knee: Secondary | ICD-10-CM | POA: Diagnosis not present

## 2016-08-18 DIAGNOSIS — M25562 Pain in left knee: Secondary | ICD-10-CM | POA: Diagnosis not present

## 2016-08-18 DIAGNOSIS — M5410 Radiculopathy, site unspecified: Secondary | ICD-10-CM

## 2016-08-18 NOTE — Progress Notes (Addendum)
OFFICE VISIT NOTE Brent FellsMichael D. Delorise Shinerigby, Gordon  [place] [facility]  Brent GuarneriJames Gordon - 56 y.o. male MRN [id number]  Date of birth: 10/14/1960  Visit Date: 08/18/2016  PCP: Brent Gordon   Referred by: Brent Gordon  Brent Gordon.  SUBJECTIVE:   Chief Complaint  Patient presents with  . Follow-up    left knee pain, low back pain   HPI: As below and per problem based documentation when appropriate.  Brent Gordon is an established patient following up for low back pain and left knee pain. He was given core conditioning home exercises and prescribed Vimovo.   Pt. Has not been doing his home exercises. He has been swimming twice a week and he has some relief from his back pain. He has been taking Vimovo, he alternates between that and Ibuprofen 800 mg. He does feel like it has helped a little.   His main concern today is his left knee. He has had increased swelling since is last visit. Pain seems to be mostly on the lateral aspect of the knee but his whole joint just "seems to be stiff". He has tried icing the knee and he gets some relief after that. Pain seems to be worse when standing or walking for prolonged periods of time. He has tried using the elliptical but this causes pain in his knee. Swimming is the only exercise that doesn't seem to bother his knee. Pain is described as a constant ache with occasional sharp stabbing pains with certain movements. He has pain when going up and down stairs, going down worse than going up. He has noticed some increased weakness in the knee and has hear occasional popping. He had u/s done 05/16/16 but no recent xray of the knee.     Review of Systems  Constitutional: Negative for chills and fever.  Respiratory: Negative for shortness of breath and wheezing.   Cardiovascular: Negative for chest pain, palpitations and leg swelling.  Musculoskeletal: Positive for joint  pain. Negative for falls.  Neurological: Positive for tingling (left foot s/p surgery). Negative for dizziness and headaches.  Endo/Heme/Allergies: Does not bruise/bleed easily.    Otherwise per HPI.  HISTORY & PERTINENT PRIOR DATA:  No specialty comments available. He reports that he has never smoked. He has never used smokeless tobacco.   Recent Labs  11/15/15 0721  HGBA1C 6.1   Medications & Allergies reviewed per EMR Patient Active Problem List   Diagnosis Date Noted  . Knee pain 05/16/2016  . OBSTRUCTIVE SLEEP APNEA 10/16/2006  . Acute low back pain with radicular symptoms, duration less than 6 weeks 09/24/2006   Past Medical History:  Diagnosis Date  . Obesity   . Pain, low back   . Sleep apnea    PSG 12/01/06 see's Dr Coralyn HellingVineet Sood  . Varicose veins    Family History  Problem Relation Age of Onset  . Cancer Mother        colon  . Diabetes Mother   . Arthritis Unknown   . Hypertension Unknown   . Cancer Unknown        lung  . Diabetes Maternal Grandmother    Past Surgical History:  Procedure Laterality Date  . COLONOSCOPY  04-19-12   Dr. Carman ChingJames Edwards, sigmoid diverticulosis & internal hemorrhoids repeat in 5 yrs  . FOOT SURGERY Left 09-26-11   triple arthrodesis per Dr. Toni ArthursJohn Hewitt  .  HERNIA REPAIR  2005/2006   Dr. Abbey Chatters  . leg vein ablation     right lower, Dr. Consuela Mimes  . TONSILLECTOMY  1967   Social History   Occupational History  . Not on file.   Social History Main Topics  . Smoking status: Never Smoker  . Smokeless tobacco: Never Used  . Alcohol use No  . Drug use: No  . Sexual activity: Not on file    OBJECTIVE:  VS:  HT:5' 8.25" (173.4 cm)   WT:278 lb 9.6 oz (126.4 kg)  BMI:42.1    BP:132/86  HR:70bpm  TEMP: ( )  RESP:96 % EXAM: Findings:  Adult male.  No acute distress.  Alert and appropriate.  Vital signs reviewed.  His bilateral knees are overall well aligned however he does have a small amount of synovitis  bilaterally.  He is ligamentously stable to varus and valgus strain bilaterally with full flexion and extension.  His extensor mechanism strength is intact.  He has tightness with straight leg raise but no radicular symptoms.  His lower extremity sensation is intact to light touch.  His lower extremity reflexes are symmetric.     Dg Knee 1-2 Views Left  Result Date: 08/18/2016 CLINICAL DATA:  56 year old male with chronic left knee pain for several months. No recent injury. Initial encounter. EXAM: LEFT KNEE 3 VIEWS; LEFT KNEE - 1-2 VIEW COMPARISON:  None. FINDINGS: Mild left patellofemoral joint degenerative changes. Small suprapatellar joint effusion. No fracture or dislocation. Single view right knee unremarkable. IMPRESSION: Mild left patellofemoral joint degenerative changes with small joint effusion. Electronically Signed   By: Lacy Duverney M.D.   On: 08/18/2016 19:54   Dg Knee Ap/lat W/sunrise Left  Result Date: 08/18/2016 CLINICAL DATA:  57 year old male with chronic left knee pain for several months. No recent injury. Initial encounter. EXAM: LEFT KNEE 3 VIEWS; LEFT KNEE - 1-2 VIEW COMPARISON:  None. FINDINGS: Mild left patellofemoral joint degenerative changes. Small suprapatellar joint effusion. No fracture or dislocation. Single view right knee unremarkable. IMPRESSION: Mild left patellofemoral joint degenerative changes with small joint effusion. Electronically Signed   By: Lacy Duverney M.D.   On: 08/18/2016 19:54   ASSESSMENT & PLAN:     ICD-10-CM   1. Left knee pain, unspecified chronicity M25.562 DG Knee AP/LAT W/Sunrise Left    DG Knee 1-2 Views Left  2. Acute low back pain with radicular symptoms, duration less than 6 weeks M54.10   ================================================================= No problem-specific Assessment & Plan notes found for this encounter. ================================================================= There are no Patient Instructions on file for  this visit.=================================================================   Follow-up: Return in about 2 months (around 10/18/2016).   CMA/ATC served as Neurosurgeon during this visit. History, Physical, and Plan performed by medical provider. Documentation and orders reviewed and attested to.      Gaspar Bidding, Gordon    [facility] Physician

## 2016-08-21 DIAGNOSIS — J3089 Other allergic rhinitis: Secondary | ICD-10-CM | POA: Diagnosis not present

## 2016-09-02 DIAGNOSIS — J3089 Other allergic rhinitis: Secondary | ICD-10-CM | POA: Diagnosis not present

## 2016-09-06 NOTE — Assessment & Plan Note (Signed)
Low back pain seems to be axial in nature.  We will have him begin on with therapeutic exercises per [facility] spine conditioning program which was provided.  We will have him follow-up in 6-8 weeks for clinical reevaluation and can consider further advanced imaging at that time if persistent symptoms.

## 2016-09-06 NOTE — Assessment & Plan Note (Signed)
Responded well to prior injections.  We discussed the possibility for Visco supplementation and he will look into this.  Continue with therapeutic exercises and strengthening.  We will defer injection today.  Anti-inflammatories as needed.

## 2016-09-06 NOTE — Addendum Note (Signed)
Addended by: Gaspar Bidding D on: 09/06/2016 10:56 AM   Modules accepted: Level of Service

## 2016-09-10 DIAGNOSIS — J3089 Other allergic rhinitis: Secondary | ICD-10-CM | POA: Diagnosis not present

## 2016-09-17 DIAGNOSIS — J3089 Other allergic rhinitis: Secondary | ICD-10-CM | POA: Diagnosis not present

## 2016-10-01 DIAGNOSIS — J3089 Other allergic rhinitis: Secondary | ICD-10-CM | POA: Diagnosis not present

## 2016-10-10 ENCOUNTER — Telehealth: Payer: Self-pay | Admitting: Sports Medicine

## 2016-10-10 NOTE — Telephone Encounter (Signed)
Patient called needing to speak to clinical team about the MRI he needs for his knee. Patient states that he is not feeling better and needed to cancel his appointment on 10/8. Patient stated that he was going to call another Orthopedic office to see if he could be seen due to him thinking his insurance does not require him to have a referral. Call patient to advise.   Patient is aware that Dr. Berline Chough and staff are out today and will not be returning until Monday.

## 2016-10-13 NOTE — Telephone Encounter (Signed)
Called pt and LM to return call so we can discuss his MRI for his L knee.

## 2016-10-14 NOTE — Telephone Encounter (Signed)
Spoke to pt and he states that he has made an appt at [facility] for his L knee.  His plan is to get the MRI through them if that's what they think he needs.  We will make a copy of his most recent x-rays which he can pick up when they're ready.

## 2016-10-14 NOTE — Telephone Encounter (Signed)
Called pt to inform him that his x-ray disc is ready and waiting at the front desk when he's available to stop by and pick them up.

## 2016-10-14 NOTE — Telephone Encounter (Signed)
Patient returning missed phone call. Patient stated he could be reached at his work number as well at [phone]. OK to leave message.

## 2016-10-15 DIAGNOSIS — M1712 Unilateral primary osteoarthritis, left knee: Secondary | ICD-10-CM | POA: Diagnosis not present

## 2016-10-15 DIAGNOSIS — J3089 Other allergic rhinitis: Secondary | ICD-10-CM | POA: Diagnosis not present

## 2016-10-15 DIAGNOSIS — M25562 Pain in left knee: Secondary | ICD-10-CM | POA: Diagnosis not present

## 2016-10-15 DIAGNOSIS — G8929 Other chronic pain: Secondary | ICD-10-CM | POA: Diagnosis not present

## 2016-10-20 ENCOUNTER — Ambulatory Visit: Payer: BLUE CROSS/BLUE SHIELD | Admitting: Sports Medicine

## 2016-10-29 DIAGNOSIS — J3089 Other allergic rhinitis: Secondary | ICD-10-CM | POA: Diagnosis not present

## 2016-10-29 DIAGNOSIS — H1045 Other chronic allergic conjunctivitis: Secondary | ICD-10-CM | POA: Diagnosis not present

## 2016-10-29 DIAGNOSIS — R0602 Shortness of breath: Secondary | ICD-10-CM | POA: Diagnosis not present

## 2016-10-31 DIAGNOSIS — M25562 Pain in left knee: Secondary | ICD-10-CM | POA: Diagnosis not present

## 2016-10-31 DIAGNOSIS — G8929 Other chronic pain: Secondary | ICD-10-CM | POA: Diagnosis not present

## 2016-11-05 DIAGNOSIS — M1712 Unilateral primary osteoarthritis, left knee: Secondary | ICD-10-CM | POA: Diagnosis not present

## 2016-11-05 DIAGNOSIS — M23312 Other meniscus derangements, anterior horn of medial meniscus, left knee: Secondary | ICD-10-CM | POA: Diagnosis not present

## 2016-11-05 DIAGNOSIS — G8929 Other chronic pain: Secondary | ICD-10-CM | POA: Diagnosis not present

## 2016-11-05 DIAGNOSIS — M25562 Pain in left knee: Secondary | ICD-10-CM | POA: Diagnosis not present

## 2016-11-12 DIAGNOSIS — J3089 Other allergic rhinitis: Secondary | ICD-10-CM | POA: Diagnosis not present

## 2016-11-13 ENCOUNTER — Ambulatory Visit (INDEPENDENT_AMBULATORY_CARE_PROVIDER_SITE_OTHER): Payer: BLUE CROSS/BLUE SHIELD | Admitting: Pulmonary Disease

## 2016-11-13 ENCOUNTER — Encounter: Payer: Self-pay | Admitting: Pulmonary Disease

## 2016-11-13 VITALS — BP 128/80 | HR 77 | Ht 72.0 in | Wt 274.4 lb

## 2016-11-13 DIAGNOSIS — G4733 Obstructive sleep apnea (adult) (pediatric): Secondary | ICD-10-CM

## 2016-11-13 DIAGNOSIS — G8929 Other chronic pain: Secondary | ICD-10-CM | POA: Diagnosis not present

## 2016-11-13 DIAGNOSIS — M25562 Pain in left knee: Secondary | ICD-10-CM

## 2016-11-13 NOTE — Progress Notes (Signed)
Subjective:    Patient ID: Brent GuarneriJames Gordon, male    DOB: 01/24/1960, 56 y.o.   MRN: [id number]  HPI  Chief Complaint  Patient presents with  . Advice Only    Former VS pt for OSA. Pt states that he is not on CPAP and has not been since late 2013/2014.  Pt had a HST done 12/01/11.      56 year old obese man presents for management of obstructive sleep apnea and preop clearance.  Arthroscopic left knee surgery is being planned by orthopedics. He was diagnosed by OSA in 2008 by Polysomnogram showing moderate degree with AHI 15/hour.  He underwent foot surgery in 2013, postoperatively had nocturnal desaturations He underwent sleep evaluation and actually had a home sleep study 11/2011 which showed mild OSA with AHI 5/hour with lowest desaturation 83%, CPAP therapy was deferred then.  He has gained about 30 pounds since then. Epworth sleepiness score is 6 and reports sleepiness while lying down rest in the afternoon. Bedtime is between 11 to 11:30 PM, sleep latency is 15 minutes, sleeps on his side with 2 pillows, reports 1-3 nocturnal awakenings including nocturia and is out of bed by 5:45 AM feeling sore and tired but denies dryness of mouth or headaches.  Wife has noted loud snoring.  He denies gasping or choking episodes during sleep There is no history suggestive of cataplexy, sleep paralysis or parasomnias   Significant tests/ events reviewed  PSG 12/01/06>>AHI 15    Past Medical History:  Diagnosis Date  . Obesity   . Pain, low back   . Sleep apnea    PSG 12/01/06 see's Dr Coralyn HellingVineet Gordon  . Varicose veins    Past Surgical History:  Procedure Laterality Date  . COLONOSCOPY  04-19-12   Dr. Carman ChingJames Gordon, sigmoid diverticulosis & internal hemorrhoids repeat in 5 yrs  . FOOT SURGERY Left 09-26-11   triple arthrodesis per Dr. Toni ArthursJohn Gordon  . HERNIA REPAIR  2005/2006   Dr. Abbey Chattersosenbower  . leg vein ablation     right lower, Dr. Consuela MimesMark Featherston  . TONSILLECTOMY  1967    No Known  Allergies  Social History   Social History  . Marital status: Married    Spouse name: N/A  . Number of children: N/A  . Years of education: N/A   Occupational History  . Not on file.   Social History Main Topics  . Smoking status: Never Smoker  . Smokeless tobacco: Never Used  . Alcohol use No  . Drug use: No  . Sexual activity: Not on file   Other Topics Concern  . Not on file   Social History Narrative  . No narrative on file     Family History  Problem Relation Age of Onset  . Cancer Mother        colon  . Diabetes Mother   . Arthritis Unknown   . Hypertension Unknown   . Cancer Unknown        lung  . Diabetes Maternal Grandmother      Review of Systems  Constitutional: Negative for fever and unexpected weight change.  HENT: Positive for sneezing. Negative for congestion, dental problem, ear pain, nosebleeds, postnasal drip, rhinorrhea, sinus pressure, sore throat and trouble swallowing.   Eyes: Positive for redness and itching.  Respiratory: Negative for cough, chest tightness, shortness of breath and wheezing.   Cardiovascular: Negative for palpitations and leg swelling.  Gastrointestinal: Negative for nausea and vomiting.  Genitourinary: Negative for dysuria.  Musculoskeletal: Negative  for joint swelling.  Skin: Negative for rash.  Allergic/Immunologic: Positive for environmental allergies. Negative for food allergies and immunocompromised state.  Neurological: Positive for headaches.  Hematological: Does not bruise/bleed easily.  Psychiatric/Behavioral: Negative for dysphoric mood. The patient is not nervous/anxious.        Objective:   Physical Exam  Gen. Pleasant, obese, in no distress, normal affect ENT - no lesions, no post nasal drip, class 2-3 airway Neck: No JVD, no thyromegaly, no carotid bruits Lungs: no use of accessory muscles, no dullness to percussion, decreased without rales or rhonchi  Cardiovascular: Rhythm regular, heart sounds   normal, no murmurs or gallops, no peripheral edema Abdomen: soft and non-tender, no hepatosplenomegaly, BS normal. Musculoskeletal: No deformities, no cyanosis or clubbing Neuro:  alert, non focal, no tremors       Assessment & Plan:

## 2016-11-13 NOTE — Patient Instructions (Signed)
Home sleep study Cleared for surgery after study

## 2016-11-13 NOTE — Assessment & Plan Note (Signed)
Would repeat home sleep study.  It is very likely due to his weight gain of 30 pounds that sleep disordered breathing is worse He will likely need CPAP therapy

## 2016-11-13 NOTE — Assessment & Plan Note (Signed)
He is planning arthroscopic surgery.  He would definitely be at increased risk for postoperative complications including desaturations and worsening sleep disordered breathing. Precautions for OSA would include -Anticipate difficult airway -Minimize use of opiates -Use CPAP postoperatively during recovery from anesthesia and probably for 1-2 nights after  If his sleep study shows AHI more than 15/however would recommend CPAP therapy

## 2016-11-20 ENCOUNTER — Institutional Professional Consult (permissible substitution): Payer: BLUE CROSS/BLUE SHIELD | Admitting: Pulmonary Disease

## 2016-11-25 ENCOUNTER — Encounter: Payer: Self-pay | Admitting: Family Medicine

## 2016-11-25 ENCOUNTER — Ambulatory Visit (INDEPENDENT_AMBULATORY_CARE_PROVIDER_SITE_OTHER): Payer: BLUE CROSS/BLUE SHIELD | Admitting: Family Medicine

## 2016-11-25 VITALS — BP 138/80 | Temp 98.4°F | Ht 72.0 in | Wt 273.0 lb

## 2016-11-25 DIAGNOSIS — Z125 Encounter for screening for malignant neoplasm of prostate: Secondary | ICD-10-CM | POA: Diagnosis not present

## 2016-11-25 DIAGNOSIS — Z0000 Encounter for general adult medical examination without abnormal findings: Secondary | ICD-10-CM

## 2016-11-25 DIAGNOSIS — J3089 Other allergic rhinitis: Secondary | ICD-10-CM | POA: Diagnosis not present

## 2016-11-25 DIAGNOSIS — R739 Hyperglycemia, unspecified: Secondary | ICD-10-CM

## 2016-11-25 LAB — HEPATIC FUNCTION PANEL
ALK PHOS: 51 U/L (ref 39–117)
ALT: 19 U/L (ref 0–53)
AST: 20 U/L (ref 0–37)
Albumin: 4.6 g/dL (ref 3.5–5.2)
BILIRUBIN DIRECT: 0.3 mg/dL (ref 0.0–0.3)
BILIRUBIN TOTAL: 1.4 mg/dL — AB (ref 0.2–1.2)
Total Protein: 7.2 g/dL (ref 6.0–8.3)

## 2016-11-25 LAB — CBC WITH DIFFERENTIAL/PLATELET
BASOS ABS: 0 10*3/uL (ref 0.0–0.1)
Basophils Relative: 0.6 % (ref 0.0–3.0)
EOS ABS: 0.1 10*3/uL (ref 0.0–0.7)
EOS PCT: 1.4 % (ref 0.0–5.0)
HCT: 47.5 % (ref 39.0–52.0)
HEMOGLOBIN: 15.8 g/dL (ref 13.0–17.0)
LYMPHS ABS: 1.4 10*3/uL (ref 0.7–4.0)
Lymphocytes Relative: 22.5 % (ref 12.0–46.0)
MCHC: 33.3 g/dL (ref 30.0–36.0)
MCV: 90.8 fl (ref 78.0–100.0)
MONO ABS: 0.4 10*3/uL (ref 0.1–1.0)
Monocytes Relative: 5.6 % (ref 3.0–12.0)
NEUTROS PCT: 69.9 % (ref 43.0–77.0)
Neutro Abs: 4.4 10*3/uL (ref 1.4–7.7)
Platelets: 268 10*3/uL (ref 150.0–400.0)
RBC: 5.23 Mil/uL (ref 4.22–5.81)
RDW: 14.6 % (ref 11.5–15.5)
WBC: 6.3 10*3/uL (ref 4.0–10.5)

## 2016-11-25 LAB — BASIC METABOLIC PANEL
BUN: 17 mg/dL (ref 6–23)
CO2: 30 mEq/L (ref 19–32)
CREATININE: 0.68 mg/dL (ref 0.40–1.50)
Calcium: 9.4 mg/dL (ref 8.4–10.5)
Chloride: 103 mEq/L (ref 96–112)
GFR: 127.7 mL/min (ref >60.00)
Glucose, Bld: 96 mg/dL (ref 70–99)
POTASSIUM: 4 meq/L (ref 3.5–5.1)
SODIUM: 140 meq/L (ref 135–145)

## 2016-11-25 LAB — POC URINALSYSI DIPSTICK (AUTOMATED)
Bilirubin, UA: NEGATIVE
Blood, UA: NEGATIVE
GLUCOSE UA: NEGATIVE
Ketones, UA: NEGATIVE
LEUKOCYTES UA: NEGATIVE
Nitrite, UA: NEGATIVE
PROTEIN UA: NEGATIVE
SPEC GRAV UA: 1.025 (ref 1.010–1.025)
Urobilinogen, UA: 0.2 E.U./dL
pH, UA: 7 (ref 5.0–8.0)

## 2016-11-25 LAB — LIPID PANEL
CHOL/HDL RATIO: 3
Cholesterol: 140 mg/dL (ref 0–200)
HDL: 40.2 mg/dL (ref >39.00)
LDL Cholesterol: 81 mg/dL (ref 0–99)
NONHDL: 99.63
Triglycerides: 94 mg/dL (ref 0.0–149.0)
VLDL: 18.8 mg/dL (ref 0.0–40.0)

## 2016-11-25 LAB — TSH: TSH: 2.45 u[IU]/mL (ref 0.35–4.50)

## 2016-11-25 LAB — PSA: PSA: 2.99 ng/mL (ref 0.10–4.00)

## 2016-11-25 LAB — HEMOGLOBIN A1C: HEMOGLOBIN A1C: 6 % (ref 4.6–6.5)

## 2016-11-25 NOTE — Progress Notes (Signed)
   Subjective:    Patient ID: Brent Gordon, male    DOB: 02/05/1960, 56 y.o.   MRN: [id number]  HPI Here for a well exam. He feels fine except for some left knee pain. He has been diagnosed with a torn meniscus by Dr. Burnis KingfisherJeffry Beane, and he will be having an arthroscopy sometime soon. He sees Dr. Vassie LollAlva for sleep apnea and he is scheduled for another sleep study later this week.    Review of Systems  Constitutional: Negative.   HENT: Negative.   Eyes: Negative.   Respiratory: Negative.   Cardiovascular: Negative.   Gastrointestinal: Negative.   Genitourinary: Negative.   Musculoskeletal: Positive for arthralgias. Negative for back pain, gait problem, joint swelling, myalgias, neck pain and neck stiffness.  Skin: Negative.   Neurological: Negative.   Psychiatric/Behavioral: Negative.        Objective:   Physical Exam  Constitutional: He is oriented to person, place, and time. He appears well-developed and well-nourished. No distress.  HENT:  Head: Normocephalic and atraumatic.  Right Ear: External ear normal.  Left Ear: External ear normal.  Nose: Nose normal.  Mouth/Throat: Oropharynx is clear and moist. No oropharyngeal exudate.  Eyes: Conjunctivae and EOM are normal. Pupils are equal, round, and reactive to light. Right eye exhibits no discharge. Left eye exhibits no discharge. No scleral icterus.  Neck: Neck supple. No JVD present. No tracheal deviation present. No thyromegaly present.  Cardiovascular: Normal rate, regular rhythm, normal heart sounds and intact distal pulses. Exam reveals no gallop and no friction rub.  No murmur heard. Pulmonary/Chest: Effort normal and breath sounds normal. No respiratory distress. He has no wheezes. He has no rales. He exhibits no tenderness.  Abdominal: Soft. Bowel sounds are normal. He exhibits no distension and no mass. There is no tenderness. There is no rebound and no guarding.  Genitourinary: Rectum normal, prostate normal and penis normal.  Rectal exam shows guaiac negative stool. No penile tenderness.  Musculoskeletal: Normal range of motion. He exhibits no edema or tenderness.  Lymphadenopathy:    He has no cervical adenopathy.  Neurological: He is alert and oriented to person, place, and time. He has normal reflexes. No cranial nerve deficit. He exhibits normal muscle tone. Coordination normal.  Skin: Skin is warm and dry. No rash noted. He is not diaphoretic. No erythema. No pallor.  Psychiatric: He has a normal mood and affect. His behavior is normal. Judgment and thought content normal.          Assessment & Plan:  Well exam. We discussed diet and exercise. Get fasting labs.  Gershon CraneStephen Claudy Abdallah, MD

## 2016-11-25 NOTE — Patient Instructions (Signed)
WE NOW OFFER   [facility] FAST TRACK!!!  SAME DAY Appointments for ACUTE CARE  Such as: Sprains, Injuries, cuts, abrasions, rashes, muscle pain, joint pain, back pain Colds, flu, sore throats, headache, allergies, cough, fever  Ear pain, sinus and eye infections Abdominal pain, nausea, vomiting, diarrhea, upset stomach Animal/insect bites  3 Easy Ways to Schedule: Walk-In Scheduling Call in scheduling Mychart Sign-up: [email or web address]         

## 2016-12-03 DIAGNOSIS — J3089 Other allergic rhinitis: Secondary | ICD-10-CM | POA: Diagnosis not present

## 2016-12-05 ENCOUNTER — Other Ambulatory Visit: Payer: Self-pay | Admitting: Family Medicine

## 2016-12-24 ENCOUNTER — Encounter: Payer: Self-pay | Admitting: Pulmonary Disease

## 2016-12-24 NOTE — Telephone Encounter (Signed)
Chan, please advise. Thanks  

## 2016-12-29 DIAGNOSIS — J3089 Other allergic rhinitis: Secondary | ICD-10-CM | POA: Diagnosis not present

## 2017-01-07 DIAGNOSIS — G4733 Obstructive sleep apnea (adult) (pediatric): Secondary | ICD-10-CM | POA: Diagnosis not present

## 2017-01-08 DIAGNOSIS — G4733 Obstructive sleep apnea (adult) (pediatric): Secondary | ICD-10-CM | POA: Diagnosis not present

## 2017-01-09 ENCOUNTER — Encounter: Payer: Self-pay | Admitting: Pulmonary Disease

## 2017-01-09 DIAGNOSIS — J3089 Other allergic rhinitis: Secondary | ICD-10-CM | POA: Diagnosis not present

## 2017-01-09 NOTE — Telephone Encounter (Signed)
RA, please advise on patient's sleep study results when you get a chance. Thanks!

## 2017-01-14 ENCOUNTER — Telehealth: Payer: Self-pay | Admitting: Pulmonary Disease

## 2017-01-14 DIAGNOSIS — J3089 Other allergic rhinitis: Secondary | ICD-10-CM | POA: Diagnosis not present

## 2017-01-14 NOTE — Telephone Encounter (Signed)
Per RA, HST showed moderate OSA with 13 events per hour. Recommends CPAP therapy. AutoCPAP 5-15cm, humidity, DL and OV in 4 weeks with RA.   Patient has been notified of results via MyChart. Will place the order for the CPAP machine once he has agreed.

## 2017-01-14 NOTE — Telephone Encounter (Signed)
Per RA, HST showed moderate OSA with 13 events per hour. Test did not show any supine sleeping but RA thinks it may be worse in this position. As a result, he recommends CPAP therapy. Auto CPAP 5-15cm, humidity, DL and OV in 4 weeks.

## 2017-01-15 ENCOUNTER — Other Ambulatory Visit: Payer: Self-pay

## 2017-01-15 ENCOUNTER — Other Ambulatory Visit: Payer: Self-pay | Admitting: *Deleted

## 2017-01-15 DIAGNOSIS — G4733 Obstructive sleep apnea (adult) (pediatric): Secondary | ICD-10-CM

## 2017-01-15 NOTE — Telephone Encounter (Signed)
Order has been placed for patient's CPAP machine. He is aware of the process. Will schedule him for his follow up once he has received his machine.

## 2017-01-22 DIAGNOSIS — J3089 Other allergic rhinitis: Secondary | ICD-10-CM | POA: Diagnosis not present

## 2017-01-26 DIAGNOSIS — J3089 Other allergic rhinitis: Secondary | ICD-10-CM | POA: Diagnosis not present

## 2017-01-27 DIAGNOSIS — S83242D Other tear of medial meniscus, current injury, left knee, subsequent encounter: Secondary | ICD-10-CM | POA: Diagnosis not present

## 2017-01-27 DIAGNOSIS — M25562 Pain in left knee: Secondary | ICD-10-CM | POA: Diagnosis not present

## 2017-01-28 DIAGNOSIS — J3089 Other allergic rhinitis: Secondary | ICD-10-CM | POA: Diagnosis not present

## 2017-01-30 DIAGNOSIS — G4733 Obstructive sleep apnea (adult) (pediatric): Secondary | ICD-10-CM | POA: Diagnosis not present

## 2017-02-04 DIAGNOSIS — J3089 Other allergic rhinitis: Secondary | ICD-10-CM | POA: Diagnosis not present

## 2017-02-10 DIAGNOSIS — J3089 Other allergic rhinitis: Secondary | ICD-10-CM | POA: Diagnosis not present

## 2017-02-26 DIAGNOSIS — J301 Allergic rhinitis due to pollen: Secondary | ICD-10-CM | POA: Diagnosis not present

## 2017-02-26 DIAGNOSIS — J3089 Other allergic rhinitis: Secondary | ICD-10-CM | POA: Diagnosis not present

## 2017-03-02 DIAGNOSIS — G4733 Obstructive sleep apnea (adult) (pediatric): Secondary | ICD-10-CM | POA: Diagnosis not present

## 2017-03-11 DIAGNOSIS — J3089 Other allergic rhinitis: Secondary | ICD-10-CM | POA: Diagnosis not present

## 2017-03-16 ENCOUNTER — Other Ambulatory Visit (HOSPITAL_COMMUNITY): Payer: Self-pay | Admitting: Specialist

## 2017-03-16 DIAGNOSIS — I714 Abdominal aortic aneurysm, without rupture, unspecified: Secondary | ICD-10-CM

## 2017-03-18 ENCOUNTER — Ambulatory Visit (HOSPITAL_COMMUNITY): Payer: BLUE CROSS/BLUE SHIELD

## 2017-03-18 DIAGNOSIS — J3089 Other allergic rhinitis: Secondary | ICD-10-CM | POA: Diagnosis not present

## 2017-03-20 ENCOUNTER — Ambulatory Visit (HOSPITAL_COMMUNITY)
Admission: RE | Admit: 2017-03-20 | Discharge: 2017-03-20 | Disposition: A | Discharge status: Home / Self Care | Payer: BLUE CROSS/BLUE SHIELD | Source: Ambulatory Visit | Attending: Specialist | Admitting: Specialist

## 2017-03-20 ENCOUNTER — Ambulatory Visit (HOSPITAL_COMMUNITY): Payer: BLUE CROSS/BLUE SHIELD

## 2017-03-20 DIAGNOSIS — Z8249 Family history of ischemic heart disease and other diseases of the circulatory system: Secondary | ICD-10-CM | POA: Insufficient documentation

## 2017-03-20 DIAGNOSIS — Z8679 Personal history of other diseases of the circulatory system: Secondary | ICD-10-CM | POA: Diagnosis not present

## 2017-03-20 DIAGNOSIS — Z136 Encounter for screening for cardiovascular disorders: Secondary | ICD-10-CM | POA: Insufficient documentation

## 2017-03-20 DIAGNOSIS — I714 Abdominal aortic aneurysm, without rupture, unspecified: Secondary | ICD-10-CM

## 2017-03-30 DIAGNOSIS — G4733 Obstructive sleep apnea (adult) (pediatric): Secondary | ICD-10-CM | POA: Diagnosis not present

## 2017-04-02 DIAGNOSIS — J3089 Other allergic rhinitis: Secondary | ICD-10-CM | POA: Diagnosis not present

## 2017-04-08 DIAGNOSIS — J3089 Other allergic rhinitis: Secondary | ICD-10-CM | POA: Diagnosis not present

## 2017-04-21 DIAGNOSIS — J3089 Other allergic rhinitis: Secondary | ICD-10-CM | POA: Diagnosis not present

## 2017-04-27 DIAGNOSIS — J3089 Other allergic rhinitis: Secondary | ICD-10-CM | POA: Diagnosis not present

## 2017-04-30 DIAGNOSIS — G4733 Obstructive sleep apnea (adult) (pediatric): Secondary | ICD-10-CM | POA: Diagnosis not present

## 2017-05-06 DIAGNOSIS — J301 Allergic rhinitis due to pollen: Secondary | ICD-10-CM | POA: Diagnosis not present

## 2017-05-06 DIAGNOSIS — J3089 Other allergic rhinitis: Secondary | ICD-10-CM | POA: Diagnosis not present

## 2017-05-13 DIAGNOSIS — J301 Allergic rhinitis due to pollen: Secondary | ICD-10-CM | POA: Diagnosis not present

## 2017-05-13 DIAGNOSIS — J3089 Other allergic rhinitis: Secondary | ICD-10-CM | POA: Diagnosis not present

## 2017-05-25 DIAGNOSIS — J3089 Other allergic rhinitis: Secondary | ICD-10-CM | POA: Diagnosis not present

## 2017-05-30 DIAGNOSIS — G4733 Obstructive sleep apnea (adult) (pediatric): Secondary | ICD-10-CM | POA: Diagnosis not present

## 2017-06-02 ENCOUNTER — Encounter: Payer: Self-pay | Admitting: Family Medicine

## 2017-06-02 ENCOUNTER — Ambulatory Visit (INDEPENDENT_AMBULATORY_CARE_PROVIDER_SITE_OTHER): Payer: BLUE CROSS/BLUE SHIELD | Admitting: Family Medicine

## 2017-06-02 VITALS — BP 110/80 | HR 68 | Temp 97.8°F | Ht 72.0 in | Wt 282.4 lb

## 2017-06-02 DIAGNOSIS — Z209 Contact with and (suspected) exposure to unspecified communicable disease: Secondary | ICD-10-CM

## 2017-06-02 DIAGNOSIS — J301 Allergic rhinitis due to pollen: Secondary | ICD-10-CM | POA: Diagnosis not present

## 2017-06-02 DIAGNOSIS — M25562 Pain in left knee: Secondary | ICD-10-CM

## 2017-06-02 DIAGNOSIS — J3089 Other allergic rhinitis: Secondary | ICD-10-CM | POA: Diagnosis not present

## 2017-06-02 DIAGNOSIS — G8929 Other chronic pain: Secondary | ICD-10-CM | POA: Diagnosis not present

## 2017-06-02 DIAGNOSIS — Z23 Encounter for immunization: Secondary | ICD-10-CM | POA: Diagnosis not present

## 2017-06-02 NOTE — Progress Notes (Signed)
   Subjective:    Patient ID: Brent Gordon, male    DOB: 07-Jan-1961, 57 y.o.   MRN: [id number]  HPI Here for several issues. First he will be having arthroscopic surgery on the left knee sometime soon per Dr. Shelle Iron, and he wants to make sure he is healthy for this. His last wellness exam was in November, and all his labs then looked great. He feels well.    Review of Systems  Constitutional: Negative.   Respiratory: Negative.   Cardiovascular: Negative.   Gastrointestinal: Negative.   Musculoskeletal: Positive for arthralgias.  Neurological: Negative.        Objective:   Physical Exam  Constitutional: He is oriented to person, place, and time. He appears well-developed and well-nourished.  Cardiovascular: Normal rate, regular rhythm, normal heart sounds and intact distal pulses.  Pulmonary/Chest: Effort normal and breath sounds normal. No stridor. No respiratory distress. He has no wheezes. He has no rales.  Musculoskeletal: He exhibits no edema.  Neurological: He is alert and oriented to person, place, and time.          Assessment & Plan:  He seems to be healthy in general although he needs to lose some weight. He should do well with the surgery. He will get hepatitis C screening today per recommendations. Given a TDaP and a rx for the Shingrix series at his pharmacy.  Gershon Crane, MD

## 2017-06-03 LAB — HEPATITIS C ANTIBODY
HEP C AB: NONREACTIVE
SIGNAL TO CUT-OFF: 0.02 (ref <1.00)

## 2017-06-18 ENCOUNTER — Other Ambulatory Visit: Payer: Self-pay | Admitting: Family Medicine

## 2017-06-18 DIAGNOSIS — J3089 Other allergic rhinitis: Secondary | ICD-10-CM | POA: Diagnosis not present

## 2017-06-18 NOTE — Telephone Encounter (Signed)
Last OV 06/02/2017   Last refilled 12/08/2016 disp 90 with 1 refill   Sent to PCP to advise

## 2017-06-29 DIAGNOSIS — M1732 Unilateral post-traumatic osteoarthritis, left knee: Secondary | ICD-10-CM | POA: Diagnosis not present

## 2017-06-29 DIAGNOSIS — M94262 Chondromalacia, left knee: Secondary | ICD-10-CM | POA: Diagnosis not present

## 2017-06-29 DIAGNOSIS — G8918 Other acute postprocedural pain: Secondary | ICD-10-CM | POA: Diagnosis not present

## 2017-06-29 DIAGNOSIS — Y999 Unspecified external cause status: Secondary | ICD-10-CM | POA: Diagnosis not present

## 2017-06-29 DIAGNOSIS — X58XXXA Exposure to other specified factors, initial encounter: Secondary | ICD-10-CM | POA: Diagnosis not present

## 2017-06-29 DIAGNOSIS — S83232A Complex tear of medial meniscus, current injury, left knee, initial encounter: Secondary | ICD-10-CM | POA: Diagnosis not present

## 2017-06-29 DIAGNOSIS — S83272A Complex tear of lateral meniscus, current injury, left knee, initial encounter: Secondary | ICD-10-CM | POA: Diagnosis not present

## 2017-06-29 DIAGNOSIS — M948X6 Other specified disorders of cartilage, lower leg: Secondary | ICD-10-CM | POA: Diagnosis not present

## 2017-06-29 DIAGNOSIS — S83242A Other tear of medial meniscus, current injury, left knee, initial encounter: Secondary | ICD-10-CM | POA: Diagnosis not present

## 2017-06-30 DIAGNOSIS — G4733 Obstructive sleep apnea (adult) (pediatric): Secondary | ICD-10-CM | POA: Diagnosis not present

## 2017-07-03 DIAGNOSIS — J3089 Other allergic rhinitis: Secondary | ICD-10-CM | POA: Diagnosis not present

## 2017-07-09 DIAGNOSIS — J3089 Other allergic rhinitis: Secondary | ICD-10-CM | POA: Diagnosis not present

## 2017-07-10 DIAGNOSIS — J3089 Other allergic rhinitis: Secondary | ICD-10-CM | POA: Diagnosis not present

## 2017-07-14 DIAGNOSIS — J3089 Other allergic rhinitis: Secondary | ICD-10-CM | POA: Diagnosis not present

## 2017-07-22 DIAGNOSIS — J3089 Other allergic rhinitis: Secondary | ICD-10-CM | POA: Diagnosis not present

## 2017-07-22 DIAGNOSIS — M25662 Stiffness of left knee, not elsewhere classified: Secondary | ICD-10-CM | POA: Diagnosis not present

## 2017-07-27 DIAGNOSIS — M25662 Stiffness of left knee, not elsewhere classified: Secondary | ICD-10-CM | POA: Diagnosis not present

## 2017-07-27 DIAGNOSIS — J3089 Other allergic rhinitis: Secondary | ICD-10-CM | POA: Diagnosis not present

## 2017-07-30 DIAGNOSIS — G4733 Obstructive sleep apnea (adult) (pediatric): Secondary | ICD-10-CM | POA: Diagnosis not present

## 2017-08-04 DIAGNOSIS — M25662 Stiffness of left knee, not elsewhere classified: Secondary | ICD-10-CM | POA: Diagnosis not present

## 2017-08-06 DIAGNOSIS — J3089 Other allergic rhinitis: Secondary | ICD-10-CM | POA: Diagnosis not present

## 2017-08-13 DIAGNOSIS — J3089 Other allergic rhinitis: Secondary | ICD-10-CM | POA: Diagnosis not present

## 2017-08-17 DIAGNOSIS — J3089 Other allergic rhinitis: Secondary | ICD-10-CM | POA: Diagnosis not present

## 2017-08-20 DIAGNOSIS — J3089 Other allergic rhinitis: Secondary | ICD-10-CM | POA: Diagnosis not present

## 2017-08-24 DIAGNOSIS — J3089 Other allergic rhinitis: Secondary | ICD-10-CM | POA: Diagnosis not present

## 2017-08-30 DIAGNOSIS — G4733 Obstructive sleep apnea (adult) (pediatric): Secondary | ICD-10-CM | POA: Diagnosis not present

## 2017-08-31 DIAGNOSIS — G4733 Obstructive sleep apnea (adult) (pediatric): Secondary | ICD-10-CM | POA: Diagnosis not present

## 2017-09-02 DIAGNOSIS — J3089 Other allergic rhinitis: Secondary | ICD-10-CM | POA: Diagnosis not present

## 2017-09-18 DIAGNOSIS — J3089 Other allergic rhinitis: Secondary | ICD-10-CM | POA: Diagnosis not present

## 2017-09-23 DIAGNOSIS — J3089 Other allergic rhinitis: Secondary | ICD-10-CM | POA: Diagnosis not present

## 2017-09-30 DIAGNOSIS — G4733 Obstructive sleep apnea (adult) (pediatric): Secondary | ICD-10-CM | POA: Diagnosis not present

## 2017-10-08 DIAGNOSIS — J3089 Other allergic rhinitis: Secondary | ICD-10-CM | POA: Diagnosis not present

## 2017-10-14 DIAGNOSIS — J3089 Other allergic rhinitis: Secondary | ICD-10-CM | POA: Diagnosis not present

## 2017-10-21 DIAGNOSIS — J3089 Other allergic rhinitis: Secondary | ICD-10-CM | POA: Diagnosis not present

## 2017-10-27 DIAGNOSIS — M545 Low back pain: Secondary | ICD-10-CM | POA: Diagnosis not present

## 2017-10-27 DIAGNOSIS — M6281 Muscle weakness (generalized): Secondary | ICD-10-CM | POA: Diagnosis not present

## 2017-10-27 DIAGNOSIS — M5432 Sciatica, left side: Secondary | ICD-10-CM | POA: Diagnosis not present

## 2017-10-28 DIAGNOSIS — J3089 Other allergic rhinitis: Secondary | ICD-10-CM | POA: Diagnosis not present

## 2017-10-30 DIAGNOSIS — G4733 Obstructive sleep apnea (adult) (pediatric): Secondary | ICD-10-CM | POA: Diagnosis not present

## 2017-11-03 DIAGNOSIS — M545 Low back pain: Secondary | ICD-10-CM | POA: Diagnosis not present

## 2017-11-03 DIAGNOSIS — M6281 Muscle weakness (generalized): Secondary | ICD-10-CM | POA: Diagnosis not present

## 2017-11-03 DIAGNOSIS — M5432 Sciatica, left side: Secondary | ICD-10-CM | POA: Diagnosis not present

## 2017-11-05 DIAGNOSIS — M6281 Muscle weakness (generalized): Secondary | ICD-10-CM | POA: Diagnosis not present

## 2017-11-05 DIAGNOSIS — M5432 Sciatica, left side: Secondary | ICD-10-CM | POA: Diagnosis not present

## 2017-11-05 DIAGNOSIS — M545 Low back pain: Secondary | ICD-10-CM | POA: Diagnosis not present

## 2017-11-10 DIAGNOSIS — M5432 Sciatica, left side: Secondary | ICD-10-CM | POA: Diagnosis not present

## 2017-11-10 DIAGNOSIS — M545 Low back pain: Secondary | ICD-10-CM | POA: Diagnosis not present

## 2017-11-10 DIAGNOSIS — M6281 Muscle weakness (generalized): Secondary | ICD-10-CM | POA: Diagnosis not present

## 2017-11-11 DIAGNOSIS — J3089 Other allergic rhinitis: Secondary | ICD-10-CM | POA: Diagnosis not present

## 2017-11-11 DIAGNOSIS — H1045 Other chronic allergic conjunctivitis: Secondary | ICD-10-CM | POA: Diagnosis not present

## 2017-11-11 DIAGNOSIS — R0602 Shortness of breath: Secondary | ICD-10-CM | POA: Diagnosis not present

## 2017-11-12 DIAGNOSIS — M5432 Sciatica, left side: Secondary | ICD-10-CM | POA: Diagnosis not present

## 2017-11-12 DIAGNOSIS — M545 Low back pain: Secondary | ICD-10-CM | POA: Diagnosis not present

## 2017-11-12 DIAGNOSIS — M6281 Muscle weakness (generalized): Secondary | ICD-10-CM | POA: Diagnosis not present

## 2017-11-16 DIAGNOSIS — M6281 Muscle weakness (generalized): Secondary | ICD-10-CM | POA: Diagnosis not present

## 2017-11-16 DIAGNOSIS — M5432 Sciatica, left side: Secondary | ICD-10-CM | POA: Diagnosis not present

## 2017-11-16 DIAGNOSIS — M545 Low back pain: Secondary | ICD-10-CM | POA: Diagnosis not present

## 2017-11-18 DIAGNOSIS — M6281 Muscle weakness (generalized): Secondary | ICD-10-CM | POA: Diagnosis not present

## 2017-11-18 DIAGNOSIS — M5432 Sciatica, left side: Secondary | ICD-10-CM | POA: Diagnosis not present

## 2017-11-18 DIAGNOSIS — M545 Low back pain: Secondary | ICD-10-CM | POA: Diagnosis not present

## 2017-11-24 DIAGNOSIS — M545 Low back pain: Secondary | ICD-10-CM | POA: Diagnosis not present

## 2017-11-24 DIAGNOSIS — M5432 Sciatica, left side: Secondary | ICD-10-CM | POA: Diagnosis not present

## 2017-11-24 DIAGNOSIS — M6281 Muscle weakness (generalized): Secondary | ICD-10-CM | POA: Diagnosis not present

## 2017-11-25 DIAGNOSIS — J3089 Other allergic rhinitis: Secondary | ICD-10-CM | POA: Diagnosis not present

## 2017-11-30 DIAGNOSIS — M5432 Sciatica, left side: Secondary | ICD-10-CM | POA: Diagnosis not present

## 2017-11-30 DIAGNOSIS — M6281 Muscle weakness (generalized): Secondary | ICD-10-CM | POA: Diagnosis not present

## 2017-11-30 DIAGNOSIS — M545 Low back pain: Secondary | ICD-10-CM | POA: Diagnosis not present

## 2017-12-02 DIAGNOSIS — J3089 Other allergic rhinitis: Secondary | ICD-10-CM | POA: Diagnosis not present

## 2017-12-11 DIAGNOSIS — M6281 Muscle weakness (generalized): Secondary | ICD-10-CM | POA: Diagnosis not present

## 2017-12-11 DIAGNOSIS — M5432 Sciatica, left side: Secondary | ICD-10-CM | POA: Diagnosis not present

## 2017-12-11 DIAGNOSIS — M545 Low back pain: Secondary | ICD-10-CM | POA: Diagnosis not present

## 2017-12-16 DIAGNOSIS — J3089 Other allergic rhinitis: Secondary | ICD-10-CM | POA: Diagnosis not present

## 2017-12-23 DIAGNOSIS — J3089 Other allergic rhinitis: Secondary | ICD-10-CM | POA: Diagnosis not present

## 2017-12-24 DIAGNOSIS — G4733 Obstructive sleep apnea (adult) (pediatric): Secondary | ICD-10-CM | POA: Diagnosis not present

## 2017-12-30 DIAGNOSIS — M545 Low back pain: Secondary | ICD-10-CM | POA: Diagnosis not present

## 2017-12-30 DIAGNOSIS — M6281 Muscle weakness (generalized): Secondary | ICD-10-CM | POA: Diagnosis not present

## 2017-12-30 DIAGNOSIS — M5432 Sciatica, left side: Secondary | ICD-10-CM | POA: Diagnosis not present

## 2018-01-08 DIAGNOSIS — J3089 Other allergic rhinitis: Secondary | ICD-10-CM | POA: Diagnosis not present

## 2018-01-20 DIAGNOSIS — J3089 Other allergic rhinitis: Secondary | ICD-10-CM | POA: Diagnosis not present

## 2018-02-10 DIAGNOSIS — J3089 Other allergic rhinitis: Secondary | ICD-10-CM | POA: Diagnosis not present

## 2018-02-24 DIAGNOSIS — J3089 Other allergic rhinitis: Secondary | ICD-10-CM | POA: Diagnosis not present

## 2018-03-12 DIAGNOSIS — J3089 Other allergic rhinitis: Secondary | ICD-10-CM | POA: Diagnosis not present

## 2018-03-25 DIAGNOSIS — G4733 Obstructive sleep apnea (adult) (pediatric): Secondary | ICD-10-CM | POA: Diagnosis not present

## 2018-04-01 ENCOUNTER — Encounter: Payer: Self-pay | Admitting: Family Medicine

## 2018-04-07 DIAGNOSIS — J3089 Other allergic rhinitis: Secondary | ICD-10-CM | POA: Diagnosis not present

## 2018-04-12 ENCOUNTER — Telehealth: Payer: Self-pay | Admitting: *Deleted

## 2018-04-12 NOTE — Telephone Encounter (Signed)
Copied from CRM [id number]. Topic: Appointment Scheduling - Scheduling Inquiry for Clinic >> Apr 09, 2018 11:01 AM Angela Nevin wrote: Reason for CRM: Patient returning call to office to reschedule appointment on 4/3. Unable to reach office.   Called and spoke with pt and appt scheduled on 4/3 for webex visit with Dr. Clent Ridges.  Pt is aware of email sent.

## 2018-04-16 ENCOUNTER — Ambulatory Visit (INDEPENDENT_AMBULATORY_CARE_PROVIDER_SITE_OTHER): Payer: BLUE CROSS/BLUE SHIELD | Admitting: Family Medicine

## 2018-04-16 ENCOUNTER — Encounter: Payer: Self-pay | Admitting: Family Medicine

## 2018-04-16 ENCOUNTER — Other Ambulatory Visit: Payer: Self-pay

## 2018-04-16 VITALS — BP 134/80 | Wt 272.0 lb

## 2018-04-16 DIAGNOSIS — M79604 Pain in right leg: Secondary | ICD-10-CM | POA: Diagnosis not present

## 2018-04-16 DIAGNOSIS — M79605 Pain in left leg: Secondary | ICD-10-CM

## 2018-04-16 DIAGNOSIS — G8929 Other chronic pain: Secondary | ICD-10-CM

## 2018-04-16 DIAGNOSIS — M25562 Pain in left knee: Secondary | ICD-10-CM | POA: Diagnosis not present

## 2018-04-16 NOTE — Progress Notes (Signed)
Subjective:    Patient ID: Brent Gordon, male    DOB: 13-Nov-1960, 58 y.o.   MRN: [id number]  HPI Virtual Visit via Video Note  I connected with the patient on 04/16/18 at  1:00 PM EDT by a video enabled telemedicine application and verified that I am speaking with the correct person using two identifiers.  Location patient: home Location provider:work or home office Persons participating in the virtual visit: patient, provider  I discussed the limitations of evaluation and management by telemedicine and the availability of in person appointments. The patient expressed understanding and agreed to proceed.   HPI: He has been doing well. He had arthroscopy on the left knee last June and this went well. He has had some aching pains in both lower legs, but he has been wearing compression stockings and these are very helpful. He and his wife try to walk outside every day for exercise. No SOB or chest pain or NVD.   ROS: See pertinent positives and negatives per HPI.  Past Medical History:  Diagnosis Date  . Obesity   . Pain, low back   . Sleep apnea    PSG 12/01/06 see's Dr Coralyn Helling  . Varicose veins     Past Surgical History:  Procedure Laterality Date  . COLONOSCOPY  04-19-12   Dr. Carman Ching, sigmoid diverticulosis & internal hemorrhoids repeat in 5 yrs  . FOOT SURGERY Left 09-26-11   triple arthrodesis per Dr. Toni Arthurs  . HERNIA REPAIR  2005/2006   Dr. Abbey Chatters  . leg vein ablation     right lower, Dr. Consuela Mimes  . TONSILLECTOMY  1967    Family History  Problem Relation Age of Onset  . Cancer Mother        colon  . Diabetes Mother   . Arthritis Unknown   . Hypertension Unknown   . Cancer Unknown        lung  . Diabetes Maternal Grandmother      Current Outpatient Medications:  .  EPINEPHrine 0.3 mg/0.3 mL IJ SOAJ injection, , Disp: , Rfl:  .  fexofenadine (ALLEGRA) 180 MG tablet, TAKE 1 TABLET EVERY DAY AS NEEDED, Disp: , Rfl: 4 .  ibuprofen  (ADVIL,MOTRIN) 200 MG tablet, Take 200 mg by mouth every 6 (six) hours as needed., Disp: , Rfl:  .  Multiple Vitamin (MULTIVITAMIN) tablet, Take 1 tablet by mouth daily., Disp: , Rfl:  .  tamsulosin (FLOMAX) 0.4 MG CAPS capsule, TAKE 1 CAPSULE (0.4 MG TOTAL) BY MOUTH DAILY., Disp: 90 capsule, Rfl: 3  EXAM:  VITALS per patient if applicable:  GENERAL: alert, oriented, appears well and in no acute distress  HEENT: atraumatic, conjunttiva clear, no obvious abnormalities on inspection of external nose and ears  NECK: normal movements of the head and neck  LUNGS: on inspection no signs of respiratory distress, breathing rate appears normal, no obvious gross SOB, gasping or wheezing  CV: no obvious cyanosis  MS: moves all visible extremities without noticeable abnormality  PSYCH/NEURO: pleasant and cooperative, no obvious depression or anxiety, speech and thought processing grossly intact  ASSESSMENT AND PLAN: He is doping well. He will use compression stockings as above. Continue to work on losing weight.  Gershon Crane, MD  Discussed the following assessment and plan:  No diagnosis found.     I discussed the assessment and treatment plan with the patient. The patient was provided an opportunity to ask questions and all were answered. The patient agreed with the  plan and demonstrated an understanding of the instructions.   The patient was advised to call back or seek an in-person evaluation if the symptoms worsen or if the condition fails to improve as anticipated.     Review of Systems     Objective:   Physical Exam        Assessment & Plan:

## 2018-04-22 DIAGNOSIS — J3089 Other allergic rhinitis: Secondary | ICD-10-CM | POA: Diagnosis not present

## 2018-04-28 DIAGNOSIS — G4733 Obstructive sleep apnea (adult) (pediatric): Secondary | ICD-10-CM | POA: Diagnosis not present

## 2018-05-06 DIAGNOSIS — J3089 Other allergic rhinitis: Secondary | ICD-10-CM | POA: Diagnosis not present

## 2018-05-11 DIAGNOSIS — J3089 Other allergic rhinitis: Secondary | ICD-10-CM | POA: Diagnosis not present

## 2018-05-13 DIAGNOSIS — J3089 Other allergic rhinitis: Secondary | ICD-10-CM | POA: Diagnosis not present

## 2018-05-18 DIAGNOSIS — J3089 Other allergic rhinitis: Secondary | ICD-10-CM | POA: Diagnosis not present

## 2018-05-20 DIAGNOSIS — J3089 Other allergic rhinitis: Secondary | ICD-10-CM | POA: Diagnosis not present

## 2018-06-02 DIAGNOSIS — J3089 Other allergic rhinitis: Secondary | ICD-10-CM | POA: Diagnosis not present

## 2018-06-02 DIAGNOSIS — J301 Allergic rhinitis due to pollen: Secondary | ICD-10-CM | POA: Diagnosis not present

## 2018-06-16 DIAGNOSIS — J3089 Other allergic rhinitis: Secondary | ICD-10-CM | POA: Diagnosis not present

## 2018-07-01 DIAGNOSIS — J3089 Other allergic rhinitis: Secondary | ICD-10-CM | POA: Diagnosis not present

## 2018-07-21 DIAGNOSIS — J3089 Other allergic rhinitis: Secondary | ICD-10-CM | POA: Diagnosis not present

## 2018-08-04 DIAGNOSIS — J3089 Other allergic rhinitis: Secondary | ICD-10-CM | POA: Diagnosis not present

## 2018-08-05 ENCOUNTER — Other Ambulatory Visit: Payer: Self-pay | Admitting: Family Medicine

## 2018-08-18 DIAGNOSIS — J3089 Other allergic rhinitis: Secondary | ICD-10-CM | POA: Diagnosis not present

## 2018-08-18 DIAGNOSIS — G4733 Obstructive sleep apnea (adult) (pediatric): Secondary | ICD-10-CM | POA: Diagnosis not present

## 2018-09-01 DIAGNOSIS — J3089 Other allergic rhinitis: Secondary | ICD-10-CM | POA: Diagnosis not present

## 2018-09-15 DIAGNOSIS — J3089 Other allergic rhinitis: Secondary | ICD-10-CM | POA: Diagnosis not present

## 2018-10-05 DIAGNOSIS — J3089 Other allergic rhinitis: Secondary | ICD-10-CM | POA: Diagnosis not present

## 2018-10-20 DIAGNOSIS — J3089 Other allergic rhinitis: Secondary | ICD-10-CM | POA: Diagnosis not present

## 2018-11-03 DIAGNOSIS — J3089 Other allergic rhinitis: Secondary | ICD-10-CM | POA: Diagnosis not present

## 2018-11-07 ENCOUNTER — Other Ambulatory Visit: Payer: Self-pay | Admitting: Family Medicine

## 2018-11-08 NOTE — Telephone Encounter (Signed)
Okay for refill?  

## 2018-11-24 DIAGNOSIS — H1045 Other chronic allergic conjunctivitis: Secondary | ICD-10-CM | POA: Diagnosis not present

## 2018-11-24 DIAGNOSIS — J3089 Other allergic rhinitis: Secondary | ICD-10-CM | POA: Diagnosis not present

## 2018-11-24 DIAGNOSIS — R0602 Shortness of breath: Secondary | ICD-10-CM | POA: Diagnosis not present

## 2018-11-26 DIAGNOSIS — G4733 Obstructive sleep apnea (adult) (pediatric): Secondary | ICD-10-CM | POA: Diagnosis not present

## 2018-12-23 DIAGNOSIS — J3089 Other allergic rhinitis: Secondary | ICD-10-CM | POA: Diagnosis not present

## 2019-01-04 DIAGNOSIS — J3089 Other allergic rhinitis: Secondary | ICD-10-CM | POA: Diagnosis not present

## 2019-01-26 DIAGNOSIS — J3089 Other allergic rhinitis: Secondary | ICD-10-CM | POA: Diagnosis not present

## 2019-02-04 DIAGNOSIS — J3089 Other allergic rhinitis: Secondary | ICD-10-CM | POA: Diagnosis not present

## 2019-02-07 DIAGNOSIS — J3089 Other allergic rhinitis: Secondary | ICD-10-CM | POA: Diagnosis not present

## 2019-02-10 DIAGNOSIS — J3089 Other allergic rhinitis: Secondary | ICD-10-CM | POA: Diagnosis not present

## 2019-02-16 DIAGNOSIS — J3089 Other allergic rhinitis: Secondary | ICD-10-CM | POA: Diagnosis not present

## 2019-02-18 DIAGNOSIS — J3089 Other allergic rhinitis: Secondary | ICD-10-CM | POA: Diagnosis not present

## 2019-02-28 DIAGNOSIS — G4733 Obstructive sleep apnea (adult) (pediatric): Secondary | ICD-10-CM | POA: Diagnosis not present

## 2019-03-04 ENCOUNTER — Encounter: Payer: Self-pay | Admitting: Family Medicine

## 2019-03-21 DIAGNOSIS — J3089 Other allergic rhinitis: Secondary | ICD-10-CM | POA: Diagnosis not present

## 2019-04-20 DIAGNOSIS — J3089 Other allergic rhinitis: Secondary | ICD-10-CM | POA: Diagnosis not present

## 2019-05-18 DIAGNOSIS — J3089 Other allergic rhinitis: Secondary | ICD-10-CM | POA: Diagnosis not present

## 2019-05-30 DIAGNOSIS — G4733 Obstructive sleep apnea (adult) (pediatric): Secondary | ICD-10-CM | POA: Diagnosis not present

## 2019-06-15 DIAGNOSIS — I8311 Varicose veins of right lower extremity with inflammation: Secondary | ICD-10-CM | POA: Diagnosis not present

## 2019-06-17 ENCOUNTER — Other Ambulatory Visit: Payer: Self-pay

## 2019-06-20 ENCOUNTER — Ambulatory Visit (INDEPENDENT_AMBULATORY_CARE_PROVIDER_SITE_OTHER): Payer: BC Managed Care – PPO | Admitting: Family Medicine

## 2019-06-20 ENCOUNTER — Other Ambulatory Visit: Payer: Self-pay

## 2019-06-20 ENCOUNTER — Encounter: Payer: Self-pay | Admitting: Family Medicine

## 2019-06-20 VITALS — BP 130/64 | HR 72 | Temp 98.1°F | Ht 72.0 in | Wt 276.2 lb

## 2019-06-20 DIAGNOSIS — Z125 Encounter for screening for malignant neoplasm of prostate: Secondary | ICD-10-CM

## 2019-06-20 DIAGNOSIS — J3089 Other allergic rhinitis: Secondary | ICD-10-CM | POA: Diagnosis not present

## 2019-06-20 DIAGNOSIS — Z0000 Encounter for general adult medical examination without abnormal findings: Secondary | ICD-10-CM

## 2019-06-20 LAB — LIPID PANEL
Cholesterol: 125 mg/dL (ref 0–200)
HDL: 33.9 mg/dL — ABNORMAL LOW (ref >39.00)
LDL Cholesterol: 78 mg/dL (ref 0–99)
NonHDL: 90.94
Total CHOL/HDL Ratio: 4
Triglycerides: 66 mg/dL (ref 0.0–149.0)
VLDL: 13.2 mg/dL (ref 0.0–40.0)

## 2019-06-20 LAB — CBC WITH DIFFERENTIAL/PLATELET
Basophils Absolute: 0 10*3/uL (ref 0.0–0.1)
Basophils Relative: 0.7 % (ref 0.0–3.0)
Eosinophils Absolute: 0.1 10*3/uL (ref 0.0–0.7)
Eosinophils Relative: 1.7 % (ref 0.0–5.0)
HCT: 41.3 % (ref 39.0–52.0)
Hemoglobin: 14.3 g/dL (ref 13.0–17.0)
Lymphocytes Relative: 23.4 % (ref 12.0–46.0)
Lymphs Abs: 1.1 10*3/uL (ref 0.7–4.0)
MCHC: 34.5 g/dL (ref 30.0–36.0)
MCV: 89.6 fl (ref 78.0–100.0)
Monocytes Absolute: 0.4 10*3/uL (ref 0.1–1.0)
Monocytes Relative: 7.9 % (ref 3.0–12.0)
Neutro Abs: 3.1 10*3/uL (ref 1.4–7.7)
Neutrophils Relative %: 66.3 % (ref 43.0–77.0)
Platelets: 220 10*3/uL (ref 150.0–400.0)
RBC: 4.61 Mil/uL (ref 4.22–5.81)
RDW: 13.4 % (ref 11.5–15.5)
WBC: 4.7 10*3/uL (ref 4.0–10.5)

## 2019-06-20 LAB — HEPATIC FUNCTION PANEL
ALT: 17 U/L (ref 0–53)
AST: 19 U/L (ref 0–37)
Albumin: 4.4 g/dL (ref 3.5–5.2)
Alkaline Phosphatase: 51 U/L (ref 39–117)
Bilirubin, Direct: 0.3 mg/dL (ref 0.0–0.3)
Total Bilirubin: 1.4 mg/dL — ABNORMAL HIGH (ref 0.2–1.2)
Total Protein: 6.5 g/dL (ref 6.0–8.3)

## 2019-06-20 LAB — TSH: TSH: 1.55 u[IU]/mL (ref 0.35–4.50)

## 2019-06-20 LAB — BASIC METABOLIC PANEL
BUN: 20 mg/dL (ref 6–23)
CO2: 29 mEq/L (ref 19–32)
Calcium: 9.1 mg/dL (ref 8.4–10.5)
Chloride: 104 mEq/L (ref 96–112)
Creatinine, Ser: 0.66 mg/dL (ref 0.40–1.50)
GFR: 123.25 mL/min (ref >60.00)
Glucose, Bld: 99 mg/dL (ref 70–99)
Potassium: 4.1 mEq/L (ref 3.5–5.1)
Sodium: 138 mEq/L (ref 135–145)

## 2019-06-20 LAB — HEMOGLOBIN A1C: Hgb A1c MFr Bld: 6.1 % (ref 4.6–6.5)

## 2019-06-20 LAB — PSA: PSA: 2.11 ng/mL (ref 0.10–4.00)

## 2019-06-20 NOTE — Progress Notes (Signed)
Subjective:    Patient ID: Brent Gordon, male    DOB: 29-Oct-1960, 59 y.o.   MRN: [id number]  HPI Here for a well exam. He feels well except for mild chronic aches in the legs. He has varicose veins, and he is scheduled for another doppler US of the legs with the [facility] next month. He is past due for a colonoscopy, and he is scheduled for another one with Dr. Charlott Rakes in a few weeks.    Review of Systems  Constitutional: Negative.   HENT: Negative.   Eyes: Negative.   Respiratory: Negative.   Cardiovascular: Negative.   Gastrointestinal: Negative.   Genitourinary: Negative.   Musculoskeletal: Positive for myalgias.  Skin: Negative.   Neurological: Negative.   Psychiatric/Behavioral: Negative.        Objective:   Physical Exam Constitutional:      General: He is not in acute distress.    Appearance: He is well-developed. He is obese. He is not diaphoretic.  HENT:     Head: Normocephalic and atraumatic.     Right Ear: External ear normal.     Left Ear: External ear normal.     Nose: Nose normal.     Mouth/Throat:     Pharynx: No oropharyngeal exudate.  Eyes:     General: No scleral icterus.       Right eye: No discharge.        Left eye: No discharge.     Conjunctiva/sclera: Conjunctivae normal.     Pupils: Pupils are equal, round, and reactive to light.  Neck:     Thyroid: No thyromegaly.     Vascular: No JVD.     Trachea: No tracheal deviation.  Cardiovascular:     Rate and Rhythm: Normal rate and regular rhythm.     Heart sounds: Normal heart sounds. No murmur. No friction rub. No gallop.   Pulmonary:     Effort: Pulmonary effort is normal. No respiratory distress.     Breath sounds: Normal breath sounds. No wheezing or rales.  Chest:     Chest wall: No tenderness.  Abdominal:     General: Bowel sounds are normal. There is no distension.     Palpations: Abdomen is soft. There is no mass.     Tenderness: There is no abdominal tenderness.  There is no guarding or rebound.  Genitourinary:    Penis: Normal. No tenderness.      Prostate: Normal.     Rectum: Normal. Guaiac result negative.  Musculoskeletal:        General: No tenderness. Normal range of motion.     Cervical back: Neck supple.  Lymphadenopathy:     Cervical: No cervical adenopathy.  Skin:    General: Skin is warm and dry.     Coloration: Skin is not pale.     Findings: No erythema or rash.  Neurological:     Mental Status: He is alert and oriented to person, place, and time.     Cranial Nerves: No cranial nerve deficit.     Motor: No abnormal muscle tone.     Coordination: Coordination normal.     Deep Tendon Reflexes: Reflexes are normal and symmetric. Reflexes normal.  Psychiatric:        Behavior: Behavior normal.        Thought Content: Thought content normal.        Judgment: Judgment normal.           Assessment & Plan:  Well exam. We discussed diet and exercise. Get fasting labs.  Alysia Penna, MD

## 2019-07-07 DIAGNOSIS — G4733 Obstructive sleep apnea (adult) (pediatric): Secondary | ICD-10-CM | POA: Diagnosis not present

## 2019-07-19 DIAGNOSIS — I8311 Varicose veins of right lower extremity with inflammation: Secondary | ICD-10-CM | POA: Diagnosis not present

## 2019-07-20 DIAGNOSIS — I8311 Varicose veins of right lower extremity with inflammation: Secondary | ICD-10-CM | POA: Diagnosis not present

## 2019-07-20 DIAGNOSIS — I83891 Varicose veins of right lower extremities with other complications: Secondary | ICD-10-CM | POA: Diagnosis not present

## 2019-07-24 IMAGING — DX DG KNEE AP/LAT W/ SUNRISE*L*
3 series · 3 of 3 positions shown · non-contrast
Comparison: None.

CLINICAL DATA: 55-year-old male with chronic left knee pain for
several months. No recent injury. Initial encounter.

EXAM:
LEFT KNEE 3 VIEWS; LEFT KNEE - 1-2 VIEW

[knee standing ap]
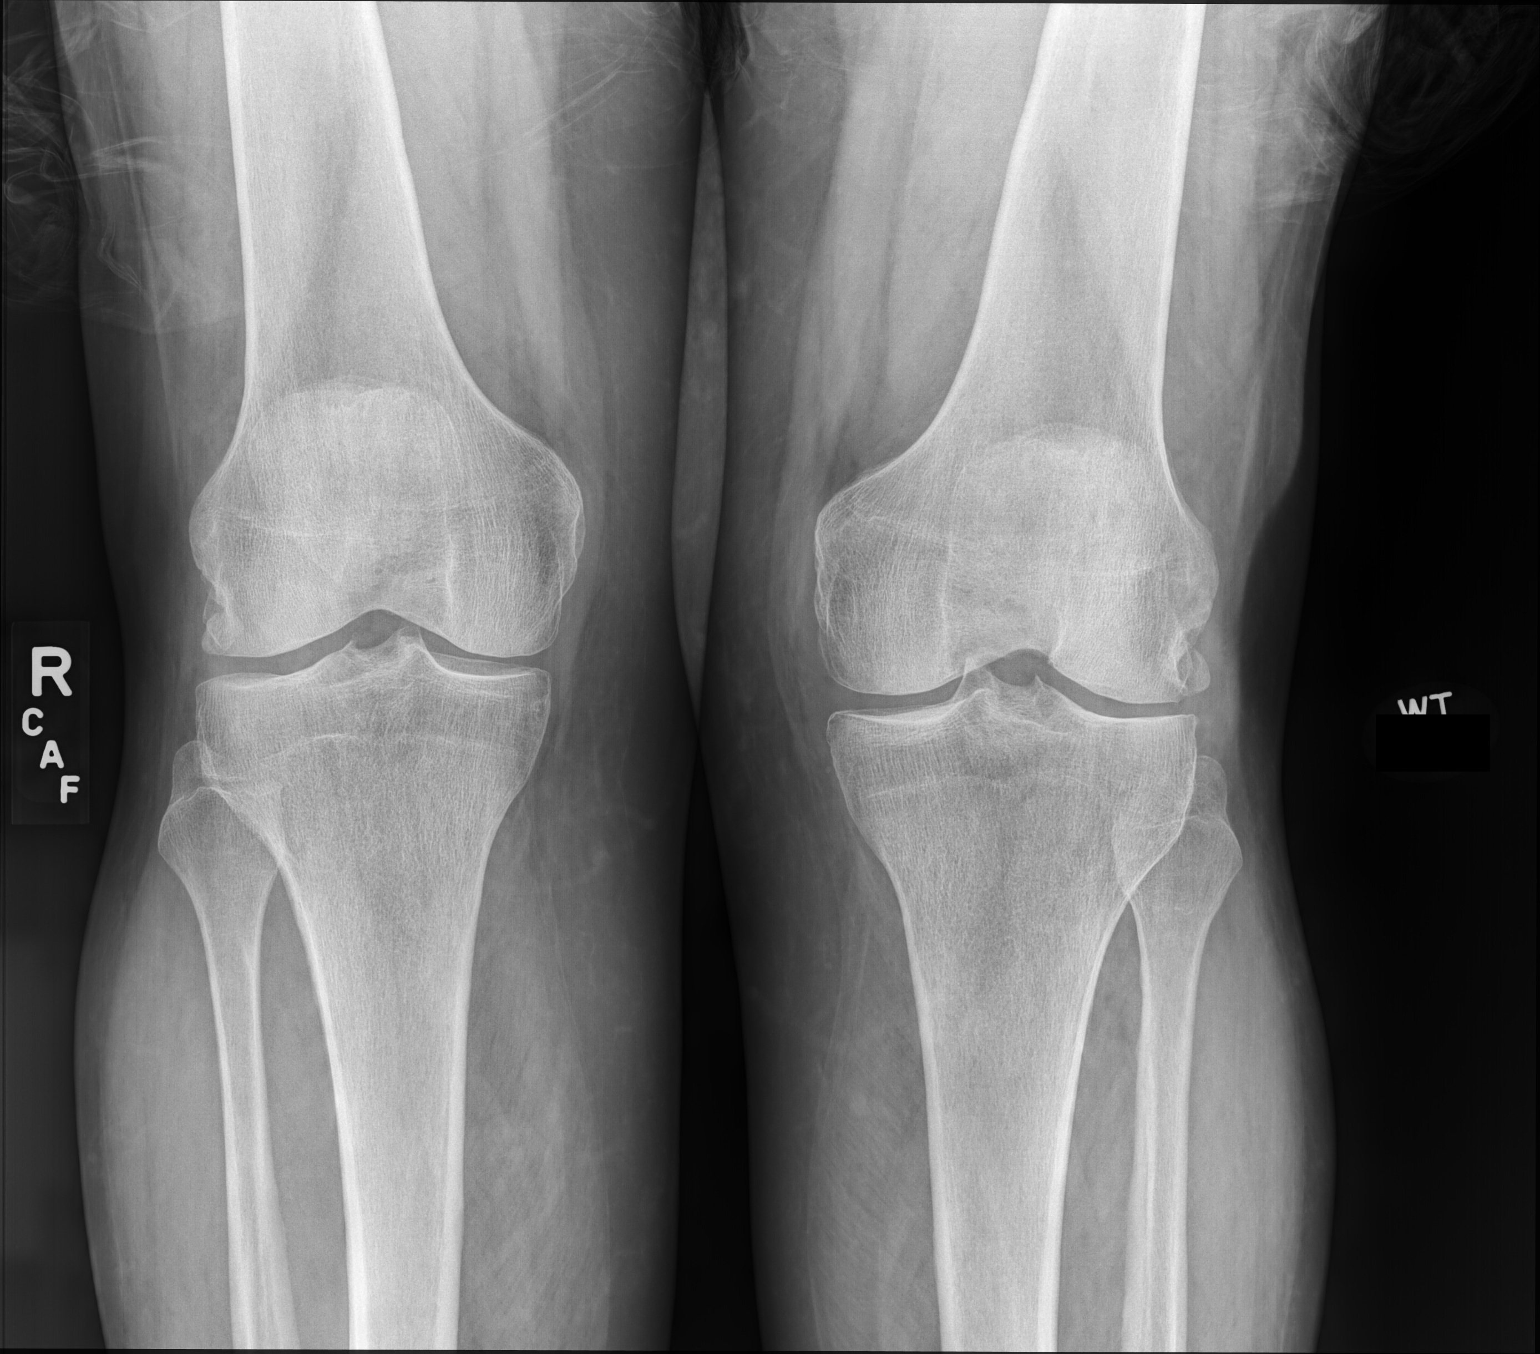

[knee standing lat]
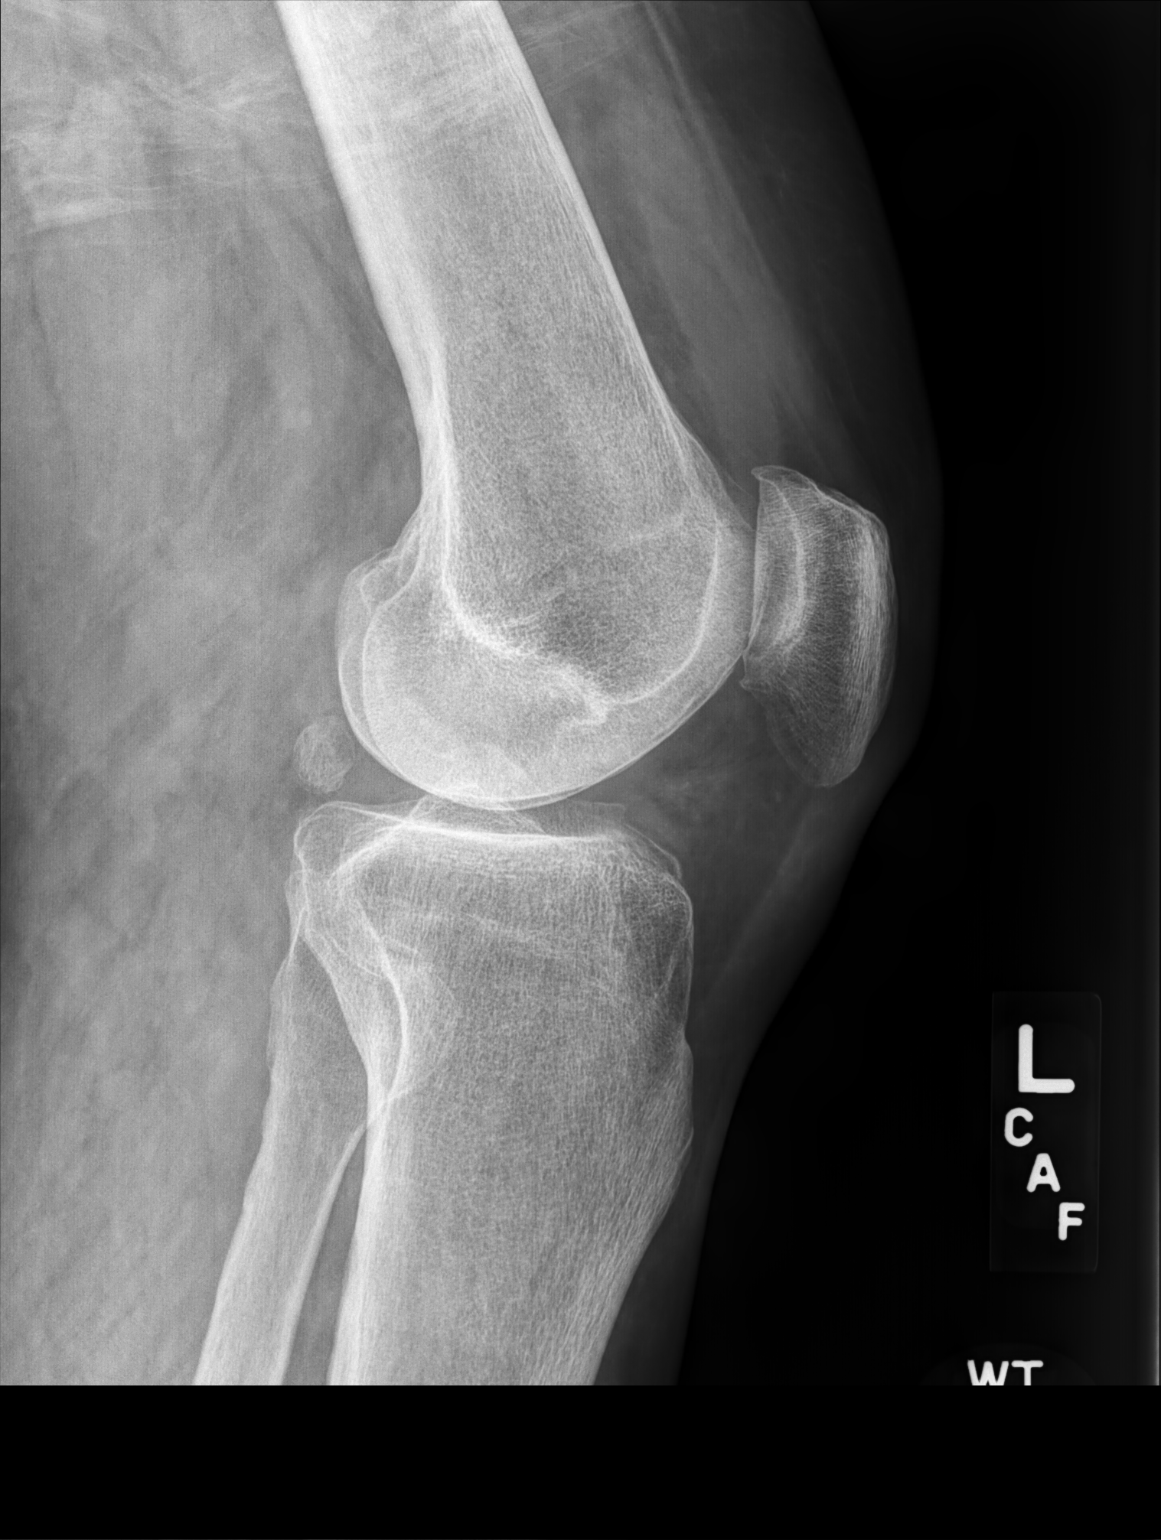

[sunrise]
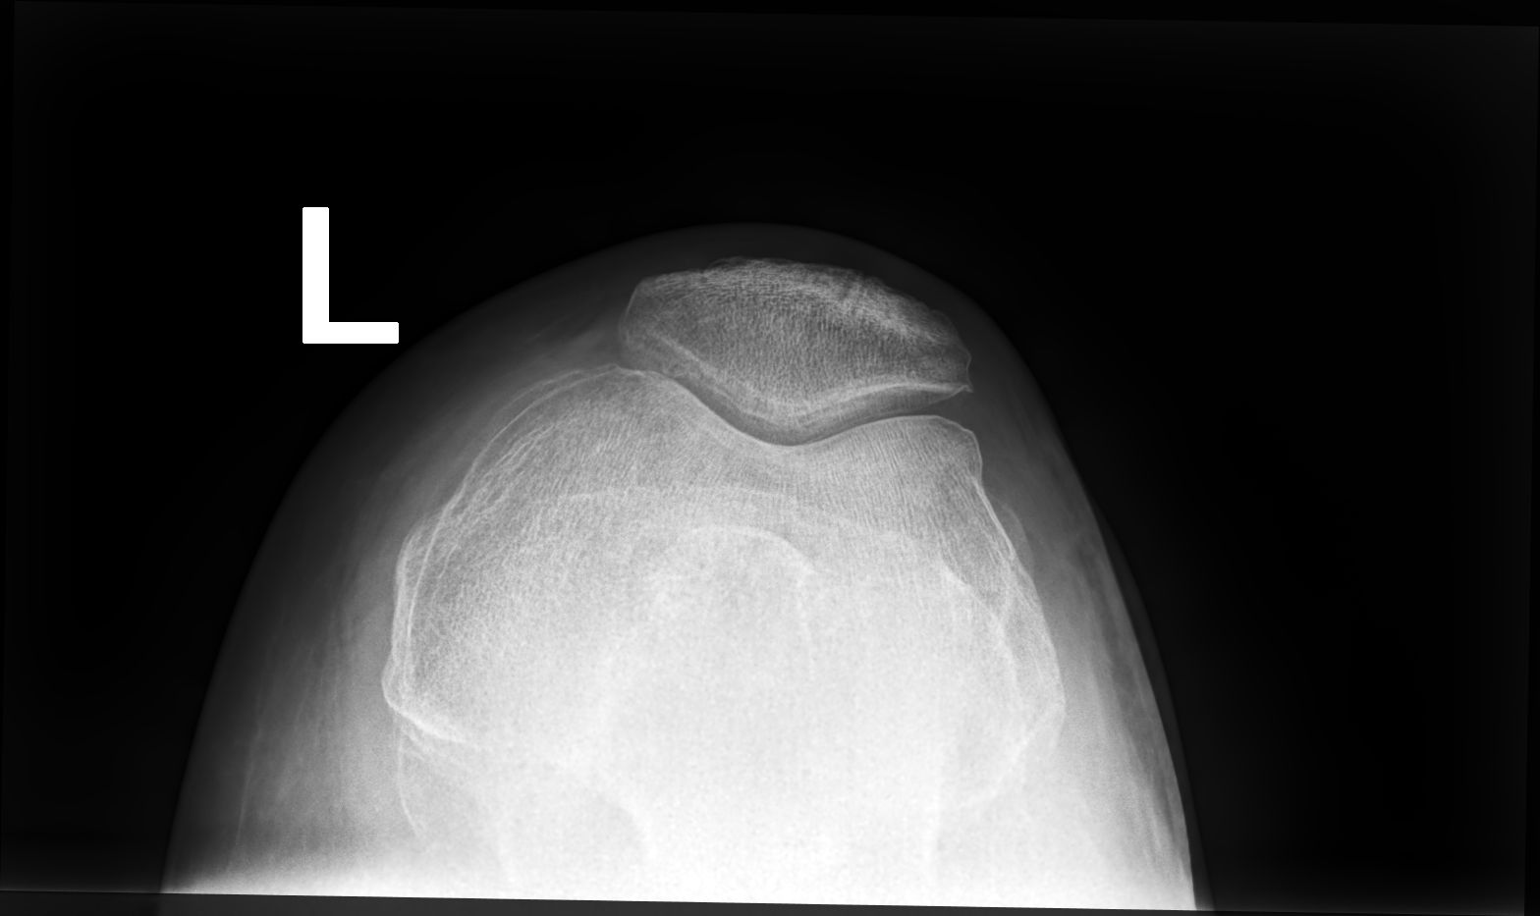

[3 of 3 positions shown; findings below may reference images not displayed]

FINDINGS: Mild left patellofemoral joint degenerative changes.

Small suprapatellar joint effusion.

No fracture or dislocation.

Single view right knee unremarkable.
IMPRESSION: Mild left patellofemoral joint degenerative changes with small joint
effusion.

## 2019-07-24 IMAGING — DX DG KNEE 1-2V*L*
2 series · 2 of 2 positions shown · non-contrast
Comparison: None.

CLINICAL DATA: 55-year-old male with chronic left knee pain for
several months. No recent injury. Initial encounter.

EXAM:
LEFT KNEE 3 VIEWS; LEFT KNEE - 1-2 VIEW

[knee ap (1 of 2)]
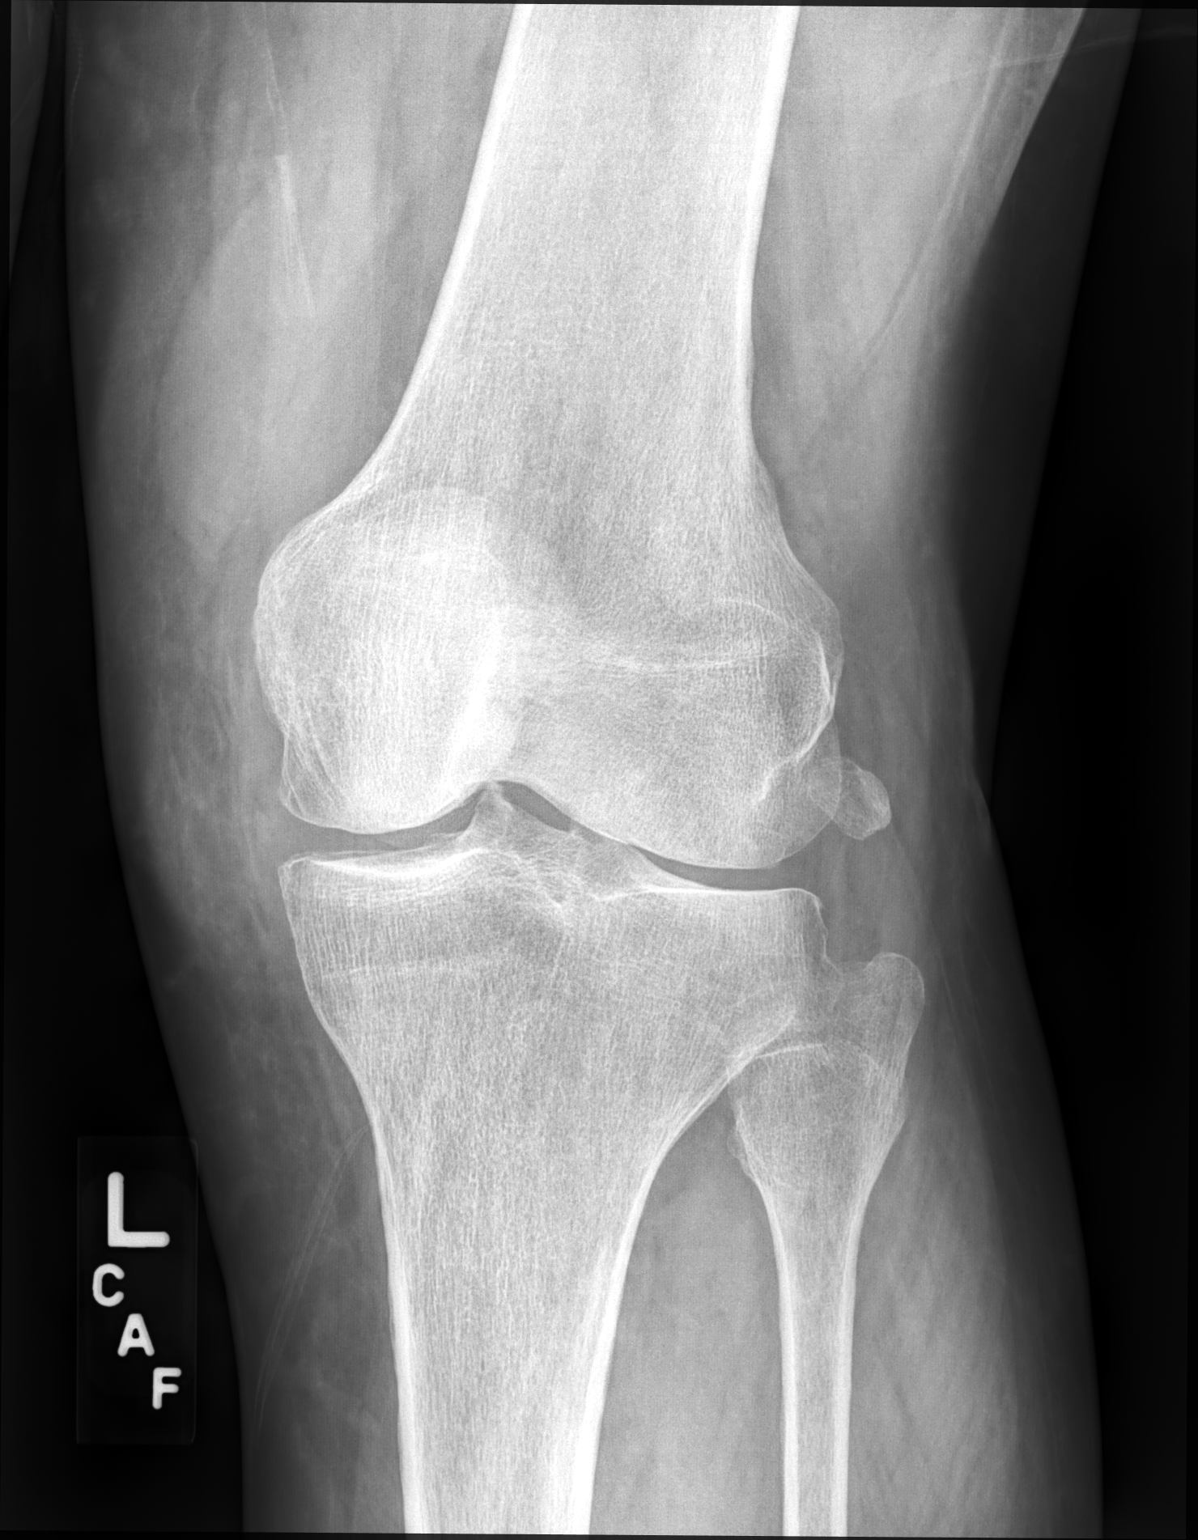

[knee ap (2 of 2)]
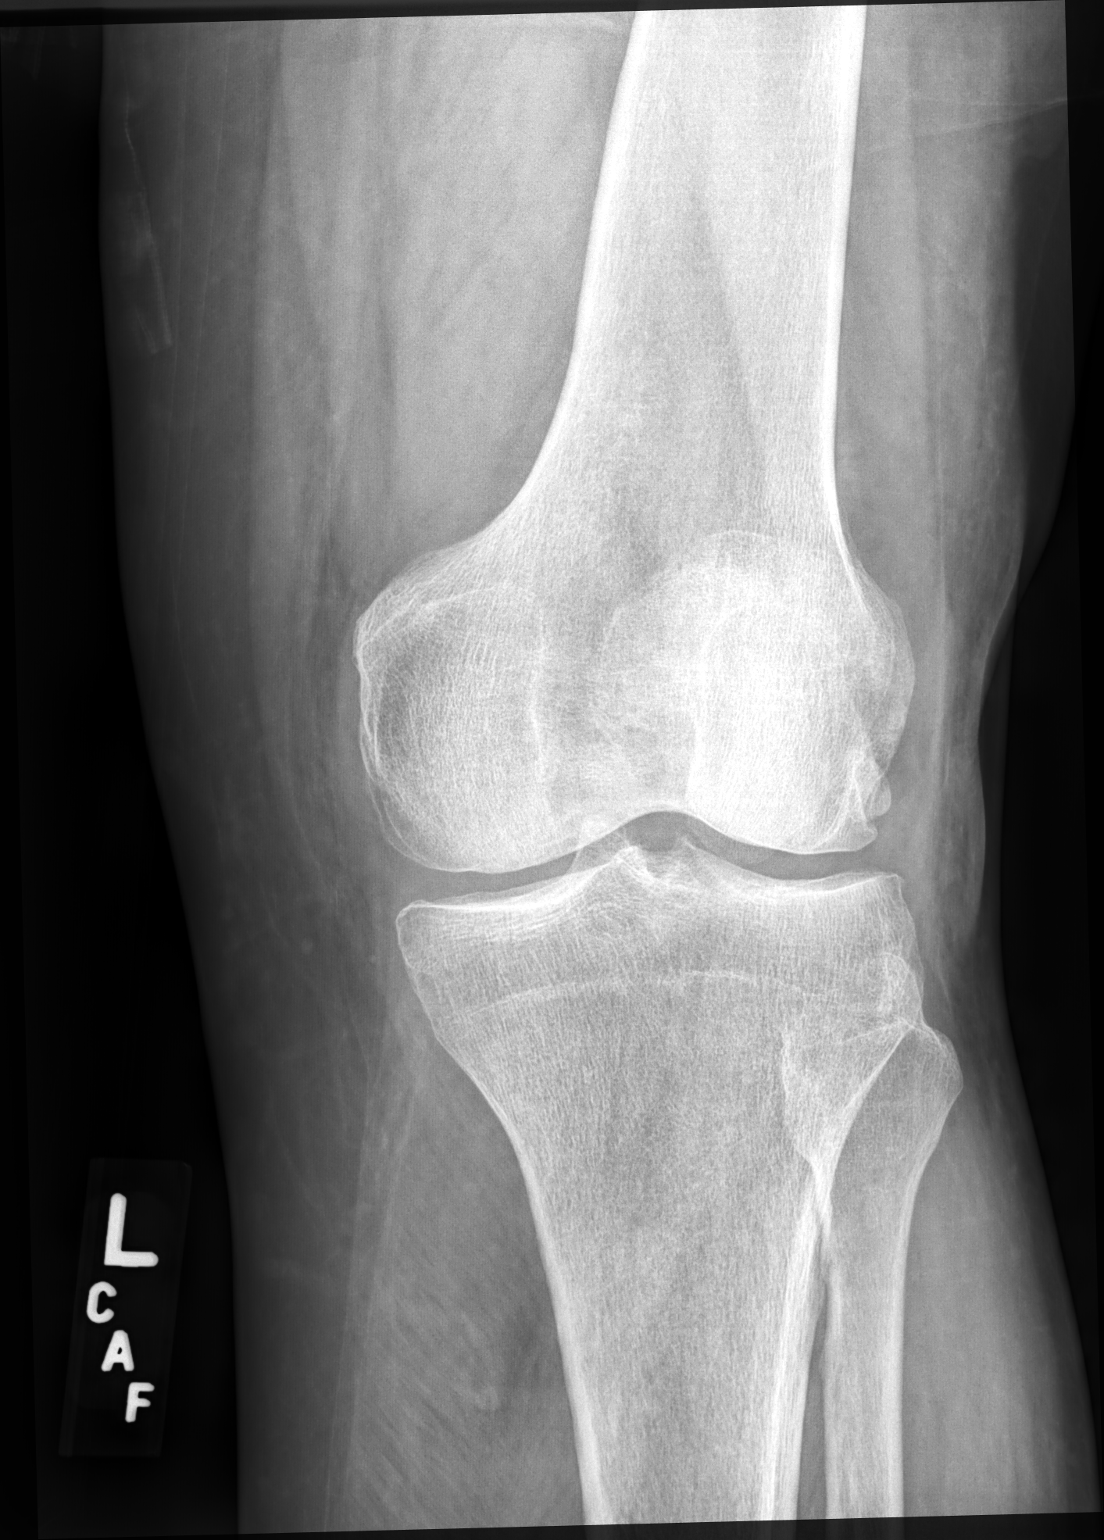

[2 of 2 positions shown; findings below may reference images not displayed]

FINDINGS: Mild left patellofemoral joint degenerative changes.

Small suprapatellar joint effusion.

No fracture or dislocation.

Single view right knee unremarkable.
IMPRESSION: Mild left patellofemoral joint degenerative changes with small joint
effusion.

## 2019-07-25 DIAGNOSIS — J301 Allergic rhinitis due to pollen: Secondary | ICD-10-CM | POA: Diagnosis not present

## 2019-07-25 DIAGNOSIS — J3089 Other allergic rhinitis: Secondary | ICD-10-CM | POA: Diagnosis not present

## 2019-08-01 DIAGNOSIS — K5730 Diverticulosis of large intestine without perforation or abscess without bleeding: Secondary | ICD-10-CM | POA: Diagnosis not present

## 2019-08-01 DIAGNOSIS — Z800 Family history of malignant neoplasm of digestive organs: Secondary | ICD-10-CM | POA: Diagnosis not present

## 2019-08-01 DIAGNOSIS — Z1211 Encounter for screening for malignant neoplasm of colon: Secondary | ICD-10-CM | POA: Diagnosis not present

## 2019-08-01 DIAGNOSIS — K640 First degree hemorrhoids: Secondary | ICD-10-CM | POA: Diagnosis not present

## 2019-08-01 HISTORY — PX: COLONOSCOPY: SHX174

## 2019-08-17 DIAGNOSIS — J3089 Other allergic rhinitis: Secondary | ICD-10-CM | POA: Diagnosis not present

## 2019-09-21 DIAGNOSIS — J3089 Other allergic rhinitis: Secondary | ICD-10-CM | POA: Diagnosis not present

## 2019-10-05 DIAGNOSIS — G4733 Obstructive sleep apnea (adult) (pediatric): Secondary | ICD-10-CM | POA: Diagnosis not present

## 2019-10-20 DIAGNOSIS — J3089 Other allergic rhinitis: Secondary | ICD-10-CM | POA: Diagnosis not present

## 2019-10-21 ENCOUNTER — Encounter: Payer: Self-pay | Admitting: Family Medicine

## 2019-10-24 NOTE — Telephone Encounter (Signed)
Left a voicemail for patient to call to schedule a flu shot.  Flu shots are scheduled out to the middle of November right now.

## 2019-10-25 ENCOUNTER — Ambulatory Visit: Payer: BC Managed Care – PPO

## 2019-10-25 ENCOUNTER — Telehealth: Payer: Self-pay | Admitting: Family Medicine

## 2019-10-25 NOTE — Telephone Encounter (Signed)
Please advise 

## 2019-10-25 NOTE — Telephone Encounter (Signed)
Patient is requesting a call about his flu shot.  He wants to know if it is ok to get a high dose flu shot at age 58, and he wants it cleared through Dr. Clent Ridges if it is ok.

## 2019-10-27 NOTE — Telephone Encounter (Signed)
Patient called and advised per Dr. Claris Che recommendation, he verbalized understanding.

## 2019-10-27 NOTE — Telephone Encounter (Signed)
Pt has been called and he is going this weekend 10/29/2019 from the pharmacy.

## 2019-10-27 NOTE — Telephone Encounter (Signed)
No, he should get the regular dose flu shot until he is 49

## 2019-11-16 DIAGNOSIS — R0602 Shortness of breath: Secondary | ICD-10-CM | POA: Diagnosis not present

## 2019-11-16 DIAGNOSIS — H1045 Other chronic allergic conjunctivitis: Secondary | ICD-10-CM | POA: Diagnosis not present

## 2019-11-16 DIAGNOSIS — J3089 Other allergic rhinitis: Secondary | ICD-10-CM | POA: Diagnosis not present

## 2019-12-23 DIAGNOSIS — G4733 Obstructive sleep apnea (adult) (pediatric): Secondary | ICD-10-CM | POA: Diagnosis not present

## 2019-12-29 ENCOUNTER — Other Ambulatory Visit: Payer: BC Managed Care – PPO

## 2020-01-05 DIAGNOSIS — J301 Allergic rhinitis due to pollen: Secondary | ICD-10-CM | POA: Diagnosis not present

## 2020-01-05 DIAGNOSIS — J3089 Other allergic rhinitis: Secondary | ICD-10-CM | POA: Diagnosis not present

## 2020-02-08 DIAGNOSIS — J3089 Other allergic rhinitis: Secondary | ICD-10-CM | POA: Diagnosis not present

## 2020-02-23 DIAGNOSIS — J301 Allergic rhinitis due to pollen: Secondary | ICD-10-CM | POA: Diagnosis not present

## 2020-02-23 DIAGNOSIS — J3089 Other allergic rhinitis: Secondary | ICD-10-CM | POA: Diagnosis not present

## 2020-03-06 DIAGNOSIS — I8311 Varicose veins of right lower extremity with inflammation: Secondary | ICD-10-CM | POA: Diagnosis not present

## 2020-03-06 DIAGNOSIS — I83891 Varicose veins of right lower extremities with other complications: Secondary | ICD-10-CM | POA: Diagnosis not present

## 2020-03-07 DIAGNOSIS — J3089 Other allergic rhinitis: Secondary | ICD-10-CM | POA: Diagnosis not present

## 2020-03-07 DIAGNOSIS — J301 Allergic rhinitis due to pollen: Secondary | ICD-10-CM | POA: Diagnosis not present

## 2020-03-09 DIAGNOSIS — J3089 Other allergic rhinitis: Secondary | ICD-10-CM | POA: Diagnosis not present

## 2020-03-12 DIAGNOSIS — J3089 Other allergic rhinitis: Secondary | ICD-10-CM | POA: Diagnosis not present

## 2020-03-14 DIAGNOSIS — J3089 Other allergic rhinitis: Secondary | ICD-10-CM | POA: Diagnosis not present

## 2020-03-21 DIAGNOSIS — J301 Allergic rhinitis due to pollen: Secondary | ICD-10-CM | POA: Diagnosis not present

## 2020-03-21 DIAGNOSIS — J3089 Other allergic rhinitis: Secondary | ICD-10-CM | POA: Diagnosis not present

## 2020-03-22 DIAGNOSIS — G4733 Obstructive sleep apnea (adult) (pediatric): Secondary | ICD-10-CM | POA: Diagnosis not present

## 2020-04-05 ENCOUNTER — Other Ambulatory Visit: Payer: Self-pay | Admitting: Family Medicine

## 2020-04-20 DIAGNOSIS — J3089 Other allergic rhinitis: Secondary | ICD-10-CM | POA: Diagnosis not present

## 2020-05-16 DIAGNOSIS — J3089 Other allergic rhinitis: Secondary | ICD-10-CM | POA: Diagnosis not present

## 2020-05-29 ENCOUNTER — Encounter: Payer: Self-pay | Admitting: Family Medicine

## 2020-06-20 DIAGNOSIS — G4733 Obstructive sleep apnea (adult) (pediatric): Secondary | ICD-10-CM | POA: Diagnosis not present

## 2020-06-22 DIAGNOSIS — J3089 Other allergic rhinitis: Secondary | ICD-10-CM | POA: Diagnosis not present

## 2020-06-27 ENCOUNTER — Ambulatory Visit (INDEPENDENT_AMBULATORY_CARE_PROVIDER_SITE_OTHER): Payer: BC Managed Care – PPO | Admitting: Family Medicine

## 2020-06-27 ENCOUNTER — Encounter: Payer: Self-pay | Admitting: Family Medicine

## 2020-06-27 ENCOUNTER — Other Ambulatory Visit: Payer: Self-pay

## 2020-06-27 VITALS — BP 128/70 | HR 68 | Temp 98.2°F | Ht 72.0 in | Wt 269.0 lb

## 2020-06-27 DIAGNOSIS — Z0000 Encounter for general adult medical examination without abnormal findings: Secondary | ICD-10-CM | POA: Diagnosis not present

## 2020-06-27 LAB — LIPID PANEL
Cholesterol: 133 mg/dL (ref 0–200)
HDL: 34 mg/dL — ABNORMAL LOW (ref >39.00)
LDL Cholesterol: 81 mg/dL (ref 0–99)
NonHDL: 99.08
Total CHOL/HDL Ratio: 4
Triglycerides: 90 mg/dL (ref 0.0–149.0)
VLDL: 18 mg/dL (ref 0.0–40.0)

## 2020-06-27 LAB — T3, FREE: T3, Free: 3 pg/mL (ref 2.3–4.2)

## 2020-06-27 LAB — CBC WITH DIFFERENTIAL/PLATELET
Basophils Absolute: 0 10*3/uL (ref 0.0–0.1)
Basophils Relative: 0.5 % (ref 0.0–3.0)
Eosinophils Absolute: 0.1 10*3/uL (ref 0.0–0.7)
Eosinophils Relative: 1.1 % (ref 0.0–5.0)
HCT: 43 % (ref 39.0–52.0)
Hemoglobin: 14.8 g/dL (ref 13.0–17.0)
Lymphocytes Relative: 17.7 % (ref 12.0–46.0)
Lymphs Abs: 1.1 10*3/uL (ref 0.7–4.0)
MCHC: 34.5 g/dL (ref 30.0–36.0)
MCV: 89 fl (ref 78.0–100.0)
Monocytes Absolute: 0.4 10*3/uL (ref 0.1–1.0)
Monocytes Relative: 6.9 % (ref 3.0–12.0)
Neutro Abs: 4.5 10*3/uL (ref 1.4–7.7)
Neutrophils Relative %: 73.8 % (ref 43.0–77.0)
Platelets: 228 10*3/uL (ref 150.0–400.0)
RBC: 4.83 Mil/uL (ref 4.22–5.81)
RDW: 13.6 % (ref 11.5–15.5)
WBC: 6.1 10*3/uL (ref 4.0–10.5)

## 2020-06-27 LAB — BASIC METABOLIC PANEL
BUN: 17 mg/dL (ref 6–23)
CO2: 28 mEq/L (ref 19–32)
Calcium: 9.3 mg/dL (ref 8.4–10.5)
Chloride: 104 mEq/L (ref 96–112)
Creatinine, Ser: 0.69 mg/dL (ref 0.40–1.50)
GFR: 100.46 mL/min (ref >60.00)
Glucose, Bld: 96 mg/dL (ref 70–99)
Potassium: 4.1 mEq/L (ref 3.5–5.1)
Sodium: 140 mEq/L (ref 135–145)

## 2020-06-27 LAB — HEPATIC FUNCTION PANEL
ALT: 16 U/L (ref 0–53)
AST: 16 U/L (ref 0–37)
Albumin: 4.6 g/dL (ref 3.5–5.2)
Alkaline Phosphatase: 46 U/L (ref 39–117)
Bilirubin, Direct: 0.3 mg/dL (ref 0.0–0.3)
Total Bilirubin: 1.4 mg/dL — ABNORMAL HIGH (ref 0.2–1.2)
Total Protein: 6.9 g/dL (ref 6.0–8.3)

## 2020-06-27 LAB — HEMOGLOBIN A1C: Hgb A1c MFr Bld: 6 % (ref 4.6–6.5)

## 2020-06-27 LAB — TSH: TSH: 1.97 u[IU]/mL (ref 0.35–4.50)

## 2020-06-27 LAB — PSA: PSA: 2.42 ng/mL (ref 0.10–4.00)

## 2020-06-27 LAB — T4, FREE: Free T4: 0.88 ng/dL (ref 0.60–1.60)

## 2020-06-27 MED ORDER — TAMSULOSIN HCL 0.4 MG PO CAPS: 0.4000 mg | ORAL_CAPSULE | Freq: Every day | ORAL | 3 refills | Status: DC

## 2020-06-27 NOTE — Progress Notes (Signed)
   Subjective:    Patient ID: Brent Gordon, male    DOB: Nov 07, 1960, 60 y.o.   MRN: [id number]  HPI Here for a well exam. He is doing well.    Review of Systems  Constitutional: Negative.   HENT: Negative.    Eyes: Negative.   Respiratory: Negative.    Cardiovascular: Negative.   Gastrointestinal: Negative.   Genitourinary: Negative.   Musculoskeletal:  Positive for arthralgias.  Skin: Negative.   Neurological: Negative.   Psychiatric/Behavioral: Negative.        Objective:   Physical Exam Constitutional:      General: He is not in acute distress.    Appearance: Normal appearance. He is well-developed. He is not diaphoretic.  HENT:     Head: Normocephalic and atraumatic.     Right Ear: External ear normal.     Left Ear: External ear normal.     Nose: Nose normal.     Mouth/Throat:     Pharynx: No oropharyngeal exudate.  Eyes:     General: No scleral icterus.       Right eye: No discharge.        Left eye: No discharge.     Conjunctiva/sclera: Conjunctivae normal.     Pupils: Pupils are equal, round, and reactive to light.  Neck:     Thyroid: No thyromegaly.     Vascular: No JVD.     Trachea: No tracheal deviation.  Cardiovascular:     Rate and Rhythm: Normal rate and regular rhythm.     Heart sounds: Normal heart sounds. No murmur heard.   No friction rub. No gallop.  Pulmonary:     Effort: Pulmonary effort is normal. No respiratory distress.     Breath sounds: Normal breath sounds. No wheezing or rales.  Chest:     Chest wall: No tenderness.  Abdominal:     General: Bowel sounds are normal. There is no distension.     Palpations: Abdomen is soft. There is no mass.     Tenderness: There is no abdominal tenderness. There is no guarding or rebound.  Genitourinary:    Penis: Normal. No tenderness.      Testes: Normal.     Prostate: Normal.     Rectum: Normal. Guaiac result negative.  Musculoskeletal:        General: No tenderness. Normal range of motion.      Cervical back: Neck supple.  Lymphadenopathy:     Cervical: No cervical adenopathy.  Skin:    General: Skin is warm and dry.     Coloration: Skin is not pale.     Findings: No erythema or rash.  Neurological:     Mental Status: He is alert and oriented to person, place, and time.     Cranial Nerves: No cranial nerve deficit.     Motor: No abnormal muscle tone.     Coordination: Coordination normal.     Deep Tendon Reflexes: Reflexes are normal and symmetric. Reflexes normal.  Psychiatric:        Behavior: Behavior normal.        Thought Content: Thought content normal.        Judgment: Judgment normal.          Assessment & Plan:  Well exam. We discussed diet and exercise. Get fasting labs.  Gershon Crane, MD

## 2020-07-17 DIAGNOSIS — J3089 Other allergic rhinitis: Secondary | ICD-10-CM | POA: Diagnosis not present

## 2020-08-15 DIAGNOSIS — J3089 Other allergic rhinitis: Secondary | ICD-10-CM | POA: Diagnosis not present

## 2020-08-17 DIAGNOSIS — J3089 Other allergic rhinitis: Secondary | ICD-10-CM | POA: Diagnosis not present

## 2020-08-22 DIAGNOSIS — J3089 Other allergic rhinitis: Secondary | ICD-10-CM | POA: Diagnosis not present

## 2020-09-14 DIAGNOSIS — J3089 Other allergic rhinitis: Secondary | ICD-10-CM | POA: Diagnosis not present

## 2020-09-20 DIAGNOSIS — G4733 Obstructive sleep apnea (adult) (pediatric): Secondary | ICD-10-CM | POA: Diagnosis not present

## 2020-09-24 DIAGNOSIS — D1801 Hemangioma of skin and subcutaneous tissue: Secondary | ICD-10-CM | POA: Diagnosis not present

## 2020-09-24 DIAGNOSIS — L821 Other seborrheic keratosis: Secondary | ICD-10-CM | POA: Diagnosis not present

## 2020-09-24 DIAGNOSIS — D239 Other benign neoplasm of skin, unspecified: Secondary | ICD-10-CM | POA: Diagnosis not present

## 2020-10-08 ENCOUNTER — Emergency Department (HOSPITAL_COMMUNITY)
Admission: EM | Admit: 2020-10-08 | Discharge: 2020-10-08 | Disposition: A | Discharge status: Home / Self Care | Payer: BC Managed Care – PPO | Attending: Emergency Medicine | Admitting: Emergency Medicine

## 2020-10-08 ENCOUNTER — Emergency Department (HOSPITAL_COMMUNITY): Payer: BC Managed Care – PPO

## 2020-10-08 ENCOUNTER — Other Ambulatory Visit: Payer: Self-pay

## 2020-10-08 ENCOUNTER — Encounter (HOSPITAL_COMMUNITY): Payer: Self-pay | Admitting: Emergency Medicine

## 2020-10-08 DIAGNOSIS — M25522 Pain in left elbow: Secondary | ICD-10-CM | POA: Diagnosis not present

## 2020-10-08 DIAGNOSIS — M19041 Primary osteoarthritis, right hand: Secondary | ICD-10-CM | POA: Diagnosis not present

## 2020-10-08 DIAGNOSIS — S3991XA Unspecified injury of abdomen, initial encounter: Secondary | ICD-10-CM | POA: Diagnosis not present

## 2020-10-08 DIAGNOSIS — S0990XA Unspecified injury of head, initial encounter: Secondary | ICD-10-CM | POA: Diagnosis not present

## 2020-10-08 DIAGNOSIS — R109 Unspecified abdominal pain: Secondary | ICD-10-CM | POA: Insufficient documentation

## 2020-10-08 DIAGNOSIS — S060X1A Concussion with loss of consciousness of 30 minutes or less, initial encounter: Secondary | ICD-10-CM | POA: Diagnosis not present

## 2020-10-08 DIAGNOSIS — R0789 Other chest pain: Secondary | ICD-10-CM | POA: Insufficient documentation

## 2020-10-08 DIAGNOSIS — R2232 Localized swelling, mass and lump, left upper limb: Secondary | ICD-10-CM | POA: Insufficient documentation

## 2020-10-08 DIAGNOSIS — S060X9A Concussion with loss of consciousness of unspecified duration, initial encounter: Secondary | ICD-10-CM | POA: Diagnosis not present

## 2020-10-08 DIAGNOSIS — Z20822 Contact with and (suspected) exposure to covid-19: Secondary | ICD-10-CM | POA: Insufficient documentation

## 2020-10-08 DIAGNOSIS — S0101XA Laceration without foreign body of scalp, initial encounter: Secondary | ICD-10-CM | POA: Insufficient documentation

## 2020-10-08 DIAGNOSIS — I10 Essential (primary) hypertension: Secondary | ICD-10-CM | POA: Diagnosis not present

## 2020-10-08 DIAGNOSIS — M4802 Spinal stenosis, cervical region: Secondary | ICD-10-CM | POA: Diagnosis not present

## 2020-10-08 DIAGNOSIS — Y92410 Unspecified street and highway as the place of occurrence of the external cause: Secondary | ICD-10-CM | POA: Insufficient documentation

## 2020-10-08 DIAGNOSIS — R9431 Abnormal electrocardiogram [ECG] [EKG]: Secondary | ICD-10-CM | POA: Diagnosis not present

## 2020-10-08 DIAGNOSIS — S2020XA Contusion of thorax, unspecified, initial encounter: Secondary | ICD-10-CM | POA: Diagnosis not present

## 2020-10-08 DIAGNOSIS — M25559 Pain in unspecified hip: Secondary | ICD-10-CM | POA: Insufficient documentation

## 2020-10-08 DIAGNOSIS — S199XXA Unspecified injury of neck, initial encounter: Secondary | ICD-10-CM | POA: Diagnosis not present

## 2020-10-08 DIAGNOSIS — M79622 Pain in left upper arm: Secondary | ICD-10-CM | POA: Diagnosis not present

## 2020-10-08 DIAGNOSIS — M79604 Pain in right leg: Secondary | ICD-10-CM | POA: Diagnosis not present

## 2020-10-08 DIAGNOSIS — M25561 Pain in right knee: Secondary | ICD-10-CM | POA: Diagnosis not present

## 2020-10-08 DIAGNOSIS — I639 Cerebral infarction, unspecified: Secondary | ICD-10-CM | POA: Diagnosis not present

## 2020-10-08 DIAGNOSIS — M79632 Pain in left forearm: Secondary | ICD-10-CM | POA: Diagnosis not present

## 2020-10-08 DIAGNOSIS — E875 Hyperkalemia: Secondary | ICD-10-CM | POA: Insufficient documentation

## 2020-10-08 DIAGNOSIS — Z041 Encounter for examination and observation following transport accident: Secondary | ICD-10-CM | POA: Diagnosis not present

## 2020-10-08 DIAGNOSIS — S3992XA Unspecified injury of lower back, initial encounter: Secondary | ICD-10-CM | POA: Diagnosis not present

## 2020-10-08 DIAGNOSIS — M25531 Pain in right wrist: Secondary | ICD-10-CM | POA: Diagnosis not present

## 2020-10-08 LAB — COMPREHENSIVE METABOLIC PANEL
ALT: 23 U/L (ref 0–44)
AST: 84 U/L — ABNORMAL HIGH (ref 15–41)
Albumin: 3.9 g/dL (ref 3.5–5.0)
Alkaline Phosphatase: 46 U/L (ref 38–126)
Anion gap: 8 (ref 5–15)
BUN: 16 mg/dL (ref 6–20)
CO2: 25 mmol/L (ref 22–32)
Calcium: 8.7 mg/dL — ABNORMAL LOW (ref 8.9–10.3)
Chloride: 102 mmol/L (ref 98–111)
Creatinine, Ser: 0.73 mg/dL (ref 0.61–1.24)
GFR, Estimated: >60 mL/min (ref >60)
Glucose, Bld: 103 mg/dL — ABNORMAL HIGH (ref 70–99)
Potassium: 6.1 mmol/L — ABNORMAL HIGH (ref 3.5–5.1)
Sodium: 135 mmol/L (ref 135–145)
Total Bilirubin: 1.1 mg/dL (ref 0.3–1.2)
Total Protein: 6.4 g/dL — ABNORMAL LOW (ref 6.5–8.1)

## 2020-10-08 LAB — CBC
HCT: 43.4 % (ref 39.0–52.0)
Hemoglobin: 15 g/dL (ref 13.0–17.0)
MCH: 31 pg (ref 26.0–34.0)
MCHC: 34.6 g/dL (ref 30.0–36.0)
MCV: 89.7 fL (ref 80.0–100.0)
Platelets: 248 10*3/uL (ref 150–400)
RBC: 4.84 MIL/uL (ref 4.22–5.81)
RDW: 13.7 % (ref 11.5–15.5)
WBC: 11.5 10*3/uL — ABNORMAL HIGH (ref 4.0–10.5)
nRBC: 0 % (ref 0.0–0.2)

## 2020-10-08 LAB — PROTIME-INR
INR: 1.2 (ref 0.8–1.2)
Prothrombin Time: 14.8 seconds (ref 11.4–15.2)

## 2020-10-08 LAB — RESP PANEL BY RT-PCR (FLU A&B, COVID) ARPGX2
Influenza A by PCR: NEGATIVE
Influenza B by PCR: NEGATIVE
SARS Coronavirus 2 by RT PCR: NEGATIVE

## 2020-10-08 LAB — I-STAT CHEM 8, ED
BUN: 21 mg/dL — ABNORMAL HIGH (ref 6–20)
Calcium, Ion: 1.03 mmol/L — ABNORMAL LOW (ref 1.15–1.40)
Chloride: 103 mmol/L (ref 98–111)
Creatinine, Ser: 0.7 mg/dL (ref 0.61–1.24)
Glucose, Bld: 101 mg/dL — ABNORMAL HIGH (ref 70–99)
HCT: 43 % (ref 39.0–52.0)
Hemoglobin: 14.6 g/dL (ref 13.0–17.0)
Potassium: 6.3 mmol/L (ref 3.5–5.1)
Sodium: 137 mmol/L (ref 135–145)
TCO2: 29 mmol/L (ref 22–32)

## 2020-10-08 LAB — URINALYSIS, ROUTINE W REFLEX MICROSCOPIC
Bilirubin Urine: NEGATIVE
Glucose, UA: NEGATIVE mg/dL
Hgb urine dipstick: NEGATIVE
Ketones, ur: 5 mg/dL — AB
Leukocytes,Ua: NEGATIVE
Nitrite: NEGATIVE
Protein, ur: NEGATIVE mg/dL
Specific Gravity, Urine: 1.035 — ABNORMAL HIGH (ref 1.005–1.030)
pH: 6 (ref 5.0–8.0)

## 2020-10-08 LAB — ETHANOL: Alcohol, Ethyl (B): <10 mg/dL (ref <10)

## 2020-10-08 LAB — POTASSIUM: Potassium: 4.3 mmol/L (ref 3.5–5.1)

## 2020-10-08 MED ORDER — IOHEXOL 350 MG/ML SOLN
100.0000 mL | Freq: Once | INTRAVENOUS | Status: AC | PRN
  Administered 2020-10-08: 100 mL via INTRAVENOUS

## 2020-10-08 MED ORDER — ACETAMINOPHEN 500 MG PO TABS
1000.0000 mg | ORAL_TABLET | Freq: Once | ORAL | Status: AC
  Administered 2020-10-08: 1000 mg via ORAL
  Filled 2020-10-08: qty 2

## 2020-10-08 MED ORDER — METOCLOPRAMIDE HCL 5 MG/ML IJ SOLN
10.0000 mg | Freq: Once | INTRAMUSCULAR | Status: AC
  Administered 2020-10-08: 10 mg via INTRAVENOUS
  Filled 2020-10-08: qty 2

## 2020-10-08 MED ORDER — LIDOCAINE-EPINEPHRINE-TETRACAINE (LET) TOPICAL GEL
3.0000 mL | Freq: Once | TOPICAL | Status: AC
  Administered 2020-10-08: 3 mL via TOPICAL
  Filled 2020-10-08: qty 3

## 2020-10-08 MED ORDER — DIPHENHYDRAMINE HCL 50 MG/ML IJ SOLN
12.5000 mg | Freq: Once | INTRAMUSCULAR | Status: AC
  Administered 2020-10-08: 12.5 mg via INTRAVENOUS
  Filled 2020-10-08: qty 1

## 2020-10-08 MED ORDER — TETANUS-DIPHTH-ACELL PERTUSSIS 5-2.5-18.5 LF-MCG/0.5 IM SUSY: 0.5000 mL | PREFILLED_SYRINGE | Freq: Once | INTRAMUSCULAR | Status: DC

## 2020-10-08 NOTE — ED Notes (Signed)
Discharge instructions discussed with pt. Pt verbalized understanding with no questions at this time. Pt to go home with s/o at bedside 
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
## 2020-10-08 NOTE — ED Notes (Signed)
Pt transported for imaging, not available for initial assessment. Suture cart and supplies at bedside
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
## 2020-10-10 ENCOUNTER — Telehealth: Payer: Self-pay

## 2020-10-10 NOTE — Telephone Encounter (Signed)
Please advise 

## 2020-10-10 NOTE — Telephone Encounter (Signed)
He can take Tylenol up to 3000 mg a day

## 2020-10-10 NOTE — Telephone Encounter (Signed)
Patient call wanting to know what dose of Ibuprofen or Advil can be taken for headache after concussion pt has appt scheduled 9/30

## 2020-10-11 NOTE — Telephone Encounter (Signed)
Spoke with patient about message.    Voiced understanding.    

## 2020-10-12 ENCOUNTER — Ambulatory Visit: Payer: BC Managed Care – PPO | Admitting: Family Medicine

## 2020-10-12 ENCOUNTER — Other Ambulatory Visit: Payer: Self-pay

## 2020-10-12 ENCOUNTER — Encounter: Payer: Self-pay | Admitting: Family Medicine

## 2020-10-12 ENCOUNTER — Ambulatory Visit (INDEPENDENT_AMBULATORY_CARE_PROVIDER_SITE_OTHER): Payer: BC Managed Care – PPO | Admitting: Family Medicine

## 2020-10-12 VITALS — BP 126/80 | HR 81 | Temp 98.3°F | Wt 267.0 lb

## 2020-10-12 DIAGNOSIS — S0101XA Laceration without foreign body of scalp, initial encounter: Secondary | ICD-10-CM

## 2020-10-12 DIAGNOSIS — S40029A Contusion of unspecified upper arm, initial encounter: Secondary | ICD-10-CM

## 2020-10-12 DIAGNOSIS — E875 Hyperkalemia: Secondary | ICD-10-CM

## 2020-10-12 DIAGNOSIS — S20219A Contusion of unspecified front wall of thorax, initial encounter: Secondary | ICD-10-CM | POA: Diagnosis not present

## 2020-10-12 DIAGNOSIS — S060X1A Concussion with loss of consciousness of 30 minutes or less, initial encounter: Secondary | ICD-10-CM | POA: Diagnosis not present
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
## 2020-10-15 ENCOUNTER — Encounter: Payer: Self-pay | Admitting: Family Medicine

## 2020-10-15 ENCOUNTER — Other Ambulatory Visit: Payer: Self-pay

## 2020-10-15 ENCOUNTER — Ambulatory Visit: Payer: BC Managed Care – PPO | Admitting: Family Medicine

## 2020-10-15 ENCOUNTER — Ambulatory Visit (INDEPENDENT_AMBULATORY_CARE_PROVIDER_SITE_OTHER): Payer: BC Managed Care – PPO | Admitting: Family Medicine

## 2020-10-15 VITALS — BP 120/80 | HR 66 | Temp 98.1°F | Wt 269.0 lb

## 2020-10-15 DIAGNOSIS — S0101XD Laceration without foreign body of scalp, subsequent encounter: Secondary | ICD-10-CM

## 2020-10-15 DIAGNOSIS — S060X1D Concussion with loss of consciousness of 30 minutes or less, subsequent encounter: Secondary | ICD-10-CM

## 2020-10-15 DIAGNOSIS — E875 Hyperkalemia: Secondary | ICD-10-CM | POA: Diagnosis not present
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
## 2020-10-16 ENCOUNTER — Encounter: Payer: Self-pay | Admitting: Family Medicine

## 2020-10-16 DIAGNOSIS — S060XAA Concussion with loss of consciousness status unknown, initial encounter: Secondary | ICD-10-CM | POA: Insufficient documentation

## 2020-10-16 LAB — BASIC METABOLIC PANEL
BUN: 22 mg/dL (ref 6–23)
CO2: 28 mEq/L (ref 19–32)
Calcium: 9.1 mg/dL (ref 8.4–10.5)
Chloride: 105 mEq/L (ref 96–112)
Creatinine, Ser: 0.64 mg/dL (ref 0.40–1.50)
GFR: 102.55 mL/min (ref >60.00)
Glucose, Bld: 93 mg/dL (ref 70–99)
Potassium: 3.9 mEq/L (ref 3.5–5.1)
Sodium: 141 mEq/L (ref 135–145)

## 2020-10-16 NOTE — Telephone Encounter (Signed)
The form is ready  

## 2020-10-18 ENCOUNTER — Ambulatory Visit (INDEPENDENT_AMBULATORY_CARE_PROVIDER_SITE_OTHER): Payer: BC Managed Care – PPO | Admitting: Sports Medicine

## 2020-10-18 ENCOUNTER — Other Ambulatory Visit: Payer: Self-pay

## 2020-10-18 VITALS — BP 140/80 | HR 64 | Ht 72.0 in | Wt 265.0 lb

## 2020-10-18 DIAGNOSIS — S060X9A Concussion with loss of consciousness of unspecified duration, initial encounter: Secondary | ICD-10-CM

## 2020-10-18 NOTE — Patient Instructions (Addendum)
Good to see you  Work note given  See me again in 1 week 
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
## 2020-10-19 DIAGNOSIS — J3081 Allergic rhinitis due to animal (cat) (dog) hair and dander: Secondary | ICD-10-CM | POA: Diagnosis not present

## 2020-10-19 DIAGNOSIS — J3089 Other allergic rhinitis: Secondary | ICD-10-CM | POA: Diagnosis not present

## 2020-10-19 NOTE — Telephone Encounter (Signed)
Pt FMLA/STD was faxed to [employer], Pt is aware and a copy was sent to scanning.

## 2020-10-20 DIAGNOSIS — G4733 Obstructive sleep apnea (adult) (pediatric): Secondary | ICD-10-CM | POA: Diagnosis not present

## 2020-10-24 DIAGNOSIS — R519 Headache, unspecified: Secondary | ICD-10-CM | POA: Diagnosis not present

## 2020-10-24 DIAGNOSIS — J3089 Other allergic rhinitis: Secondary | ICD-10-CM | POA: Diagnosis not present

## 2020-10-24 DIAGNOSIS — H1045 Other chronic allergic conjunctivitis: Secondary | ICD-10-CM | POA: Diagnosis not present

## 2020-10-24 DIAGNOSIS — R0602 Shortness of breath: Secondary | ICD-10-CM | POA: Diagnosis not present
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
## 2020-10-25 ENCOUNTER — Other Ambulatory Visit: Payer: Self-pay

## 2020-10-25 ENCOUNTER — Ambulatory Visit (INDEPENDENT_AMBULATORY_CARE_PROVIDER_SITE_OTHER): Payer: BC Managed Care – PPO | Admitting: Sports Medicine

## 2020-10-25 VITALS — BP 110/80 | HR 95 | Ht 72.0 in | Wt 265.0 lb

## 2020-10-25 DIAGNOSIS — G44319 Acute post-traumatic headache, not intractable: Secondary | ICD-10-CM

## 2020-10-25 DIAGNOSIS — S060X0D Concussion without loss of consciousness, subsequent encounter: Secondary | ICD-10-CM

## 2020-10-25 NOTE — Patient Instructions (Addendum)
Good to see you  Work note given  See me again in 1 week
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
## 2020-11-01 ENCOUNTER — Other Ambulatory Visit: Payer: Self-pay

## 2020-11-01 ENCOUNTER — Ambulatory Visit (INDEPENDENT_AMBULATORY_CARE_PROVIDER_SITE_OTHER): Payer: BC Managed Care – PPO | Admitting: Sports Medicine

## 2020-11-01 VITALS — BP 156/98 | HR 66 | Ht 72.0 in | Wt 267.0 lb

## 2020-11-01 DIAGNOSIS — S060X0D Concussion without loss of consciousness, subsequent encounter: Secondary | ICD-10-CM | POA: Diagnosis not present

## 2020-11-01 DIAGNOSIS — G44319 Acute post-traumatic headache, not intractable: Secondary | ICD-10-CM

## 2020-11-01 NOTE — Patient Instructions (Signed)
May start back to work Monday the 24th Recommend starting only 4 hours See you again in 1 week No operating heavy machinery

## 2020-11-08 ENCOUNTER — Other Ambulatory Visit: Payer: Self-pay

## 2020-11-08 ENCOUNTER — Ambulatory Visit (INDEPENDENT_AMBULATORY_CARE_PROVIDER_SITE_OTHER): Payer: BC Managed Care – PPO | Admitting: Sports Medicine

## 2020-11-08 VITALS — BP 108/76 | HR 74 | Ht 72.0 in | Wt 253.0 lb

## 2020-11-08 DIAGNOSIS — G44319 Acute post-traumatic headache, not intractable: Secondary | ICD-10-CM | POA: Diagnosis not present

## 2020-11-08 DIAGNOSIS — S060X0D Concussion without loss of consciousness, subsequent encounter: Secondary | ICD-10-CM
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
## 2020-11-08 NOTE — Patient Instructions (Addendum)
Start 6 hour work days  Follow up in 1 week
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
## 2020-11-15 ENCOUNTER — Other Ambulatory Visit: Payer: Self-pay

## 2020-11-15 ENCOUNTER — Ambulatory Visit (INDEPENDENT_AMBULATORY_CARE_PROVIDER_SITE_OTHER): Payer: BC Managed Care – PPO | Admitting: Sports Medicine

## 2020-11-15 VITALS — BP 122/86 | HR 70 | Ht 72.0 in

## 2020-11-15 DIAGNOSIS — G44319 Acute post-traumatic headache, not intractable: Secondary | ICD-10-CM | POA: Diagnosis not present

## 2020-11-15 DIAGNOSIS — S060X0D Concussion without loss of consciousness, subsequent encounter: Secondary | ICD-10-CM | POA: Diagnosis not present

## 2020-11-15 DIAGNOSIS — J3089 Other allergic rhinitis: Secondary | ICD-10-CM | POA: Diagnosis not present

## 2020-11-15 NOTE — Patient Instructions (Addendum)
Good to see you  Note given  Follow up in 2 weeks

## 2020-11-20 DIAGNOSIS — G4733 Obstructive sleep apnea (adult) (pediatric): Secondary | ICD-10-CM | POA: Diagnosis not present

## 2020-11-22 DIAGNOSIS — L814 Other melanin hyperpigmentation: Secondary | ICD-10-CM | POA: Diagnosis not present

## 2020-11-22 DIAGNOSIS — L821 Other seborrheic keratosis: Secondary | ICD-10-CM | POA: Diagnosis not present

## 2020-11-22 DIAGNOSIS — D239 Other benign neoplasm of skin, unspecified: Secondary | ICD-10-CM | POA: Diagnosis not present
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
## 2020-11-27 ENCOUNTER — Ambulatory Visit (INDEPENDENT_AMBULATORY_CARE_PROVIDER_SITE_OTHER): Payer: BC Managed Care – PPO | Admitting: Sports Medicine

## 2020-11-27 ENCOUNTER — Other Ambulatory Visit: Payer: Self-pay

## 2020-11-27 VITALS — BP 110/82 | HR 80 | Ht 72.0 in | Wt 268.0 lb

## 2020-11-27 DIAGNOSIS — G44319 Acute post-traumatic headache, not intractable: Secondary | ICD-10-CM

## 2020-11-27 DIAGNOSIS — S060X0D Concussion without loss of consciousness, subsequent encounter: Secondary | ICD-10-CM

## 2020-11-27 NOTE — Patient Instructions (Addendum)
Good to see you  Follow up as needed  

## 2020-12-20 DIAGNOSIS — J3089 Other allergic rhinitis: Secondary | ICD-10-CM | POA: Diagnosis not present

## 2020-12-31 DIAGNOSIS — G4733 Obstructive sleep apnea (adult) (pediatric): Secondary | ICD-10-CM | POA: Diagnosis not present

## 2021-01-17 DIAGNOSIS — J3089 Other allergic rhinitis: Secondary | ICD-10-CM | POA: Diagnosis not present
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
## 2021-01-29 ENCOUNTER — Other Ambulatory Visit: Payer: Self-pay

## 2021-01-29 ENCOUNTER — Ambulatory Visit (INDEPENDENT_AMBULATORY_CARE_PROVIDER_SITE_OTHER): Payer: BC Managed Care – PPO | Admitting: Sports Medicine

## 2021-01-29 VITALS — BP 118/80 | HR 74 | Ht 72.0 in | Wt 268.0 lb

## 2021-01-29 DIAGNOSIS — S060X0D Concussion without loss of consciousness, subsequent encounter: Secondary | ICD-10-CM

## 2021-01-29 DIAGNOSIS — G44319 Acute post-traumatic headache, not intractable: Secondary | ICD-10-CM

## 2021-01-29 DIAGNOSIS — G8929 Other chronic pain: Secondary | ICD-10-CM

## 2021-01-29 DIAGNOSIS — M25562 Pain in left knee: Secondary | ICD-10-CM | POA: Diagnosis not present

## 2021-01-29 DIAGNOSIS — M25512 Pain in left shoulder: Secondary | ICD-10-CM

## 2021-01-29 MED ORDER — MELOXICAM 15 MG PO TABS: 15.0000 mg | ORAL_TABLET | Freq: Every day | ORAL | 0 refills | Status: DC

## 2021-01-29 NOTE — Patient Instructions (Addendum)
Good to see you Meloxicam 15 mg daily for 2 weeks remainder as needed  4 week Follow up

## 2021-01-31 DIAGNOSIS — G4733 Obstructive sleep apnea (adult) (pediatric): Secondary | ICD-10-CM | POA: Diagnosis not present

## 2021-02-15 DIAGNOSIS — J3089 Other allergic rhinitis: Secondary | ICD-10-CM | POA: Diagnosis not present
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
## 2021-02-26 ENCOUNTER — Ambulatory Visit (INDEPENDENT_AMBULATORY_CARE_PROVIDER_SITE_OTHER): Payer: BC Managed Care – PPO | Admitting: Sports Medicine

## 2021-02-26 ENCOUNTER — Other Ambulatory Visit: Payer: Self-pay

## 2021-02-26 VITALS — BP 134/80 | HR 69 | Ht 72.0 in | Wt 273.0 lb

## 2021-02-26 DIAGNOSIS — G8929 Other chronic pain: Secondary | ICD-10-CM | POA: Diagnosis not present

## 2021-02-26 DIAGNOSIS — S060X0D Concussion without loss of consciousness, subsequent encounter: Secondary | ICD-10-CM | POA: Diagnosis not present

## 2021-02-26 DIAGNOSIS — G44319 Acute post-traumatic headache, not intractable: Secondary | ICD-10-CM | POA: Diagnosis not present

## 2021-02-26 DIAGNOSIS — M25512 Pain in left shoulder: Secondary | ICD-10-CM

## 2021-02-26 NOTE — Patient Instructions (Addendum)
Good to see you  Recommend  PT for left shoulder contact us with physical therapy group and we will send referral No restriction in terms of concussion with work   Discontinue daily meloxicam use remainder as needed  Tylenol [phone] mg  1-2 tablets 2-3 times a day for pain relief  4 week follow up

## 2021-02-27 ENCOUNTER — Other Ambulatory Visit: Payer: Self-pay | Admitting: Sports Medicine

## 2021-02-27 ENCOUNTER — Encounter: Payer: Self-pay | Admitting: Sports Medicine

## 2021-02-27 DIAGNOSIS — M25512 Pain in left shoulder: Secondary | ICD-10-CM

## 2021-02-27 DIAGNOSIS — M25562 Pain in left knee: Secondary | ICD-10-CM

## 2021-02-27 DIAGNOSIS — G8929 Other chronic pain: Secondary | ICD-10-CM

## 2021-02-27 NOTE — Telephone Encounter (Signed)
Referral was sent into [facility] and patient was messaged

## 2021-03-02 ENCOUNTER — Other Ambulatory Visit: Payer: Self-pay | Admitting: Sports Medicine

## 2021-03-03 DIAGNOSIS — G4733 Obstructive sleep apnea (adult) (pediatric): Secondary | ICD-10-CM | POA: Diagnosis not present

## 2021-03-05 ENCOUNTER — Encounter: Payer: Self-pay | Admitting: Sports Medicine

## 2021-03-05 ENCOUNTER — Other Ambulatory Visit: Payer: Self-pay | Admitting: Sports Medicine

## 2021-03-05 DIAGNOSIS — G8929 Other chronic pain: Secondary | ICD-10-CM

## 2021-03-05 DIAGNOSIS — M25562 Pain in left knee: Secondary | ICD-10-CM

## 2021-03-05 DIAGNOSIS — M25512 Pain in left shoulder: Secondary | ICD-10-CM

## 2021-03-05 DIAGNOSIS — J3089 Other allergic rhinitis: Secondary | ICD-10-CM | POA: Diagnosis not present

## 2021-03-05 NOTE — Telephone Encounter (Signed)
Pt was messaged and notified that referral to [facility]  has been sent in
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
## 2021-03-06 ENCOUNTER — Ambulatory Visit: Payer: BC Managed Care – PPO | Attending: Sports Medicine

## 2021-03-06 ENCOUNTER — Other Ambulatory Visit: Payer: Self-pay

## 2021-03-06 DIAGNOSIS — M25562 Pain in left knee: Secondary | ICD-10-CM | POA: Insufficient documentation

## 2021-03-06 DIAGNOSIS — R293 Abnormal posture: Secondary | ICD-10-CM | POA: Insufficient documentation

## 2021-03-06 DIAGNOSIS — M25512 Pain in left shoulder: Secondary | ICD-10-CM | POA: Insufficient documentation

## 2021-03-06 DIAGNOSIS — M25612 Stiffness of left shoulder, not elsewhere classified: Secondary | ICD-10-CM | POA: Insufficient documentation

## 2021-03-06 DIAGNOSIS — G8929 Other chronic pain: Secondary | ICD-10-CM | POA: Diagnosis not present

## 2021-03-13 DIAGNOSIS — J301 Allergic rhinitis due to pollen: Secondary | ICD-10-CM | POA: Diagnosis not present

## 2021-03-13 DIAGNOSIS — J3081 Allergic rhinitis due to animal (cat) (dog) hair and dander: Secondary | ICD-10-CM | POA: Diagnosis not present

## 2021-03-13 DIAGNOSIS — J3089 Other allergic rhinitis: Secondary | ICD-10-CM | POA: Diagnosis not present

## 2021-03-14 ENCOUNTER — Other Ambulatory Visit: Payer: Self-pay

## 2021-03-14 ENCOUNTER — Ambulatory Visit: Payer: BC Managed Care – PPO | Attending: Sports Medicine

## 2021-03-14 DIAGNOSIS — G8929 Other chronic pain: Secondary | ICD-10-CM | POA: Diagnosis not present

## 2021-03-14 DIAGNOSIS — M25612 Stiffness of left shoulder, not elsewhere classified: Secondary | ICD-10-CM

## 2021-03-14 DIAGNOSIS — R293 Abnormal posture: Secondary | ICD-10-CM | POA: Diagnosis not present

## 2021-03-14 DIAGNOSIS — M25512 Pain in left shoulder: Secondary | ICD-10-CM | POA: Diagnosis not present
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
## 2021-03-19 ENCOUNTER — Ambulatory Visit: Payer: BC Managed Care – PPO | Admitting: Physical Therapy

## 2021-03-19 ENCOUNTER — Other Ambulatory Visit: Payer: Self-pay

## 2021-03-19 DIAGNOSIS — G8929 Other chronic pain: Secondary | ICD-10-CM

## 2021-03-19 DIAGNOSIS — M25612 Stiffness of left shoulder, not elsewhere classified: Secondary | ICD-10-CM | POA: Diagnosis not present

## 2021-03-19 DIAGNOSIS — R293 Abnormal posture: Secondary | ICD-10-CM

## 2021-03-19 DIAGNOSIS — M25512 Pain in left shoulder: Secondary | ICD-10-CM | POA: Diagnosis not present
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
## 2021-03-26 ENCOUNTER — Ambulatory Visit (INDEPENDENT_AMBULATORY_CARE_PROVIDER_SITE_OTHER): Payer: BC Managed Care – PPO | Admitting: Sports Medicine

## 2021-03-26 ENCOUNTER — Other Ambulatory Visit: Payer: Self-pay

## 2021-03-26 VITALS — BP 120/78 | HR 68 | Ht 72.0 in | Wt 270.0 lb

## 2021-03-26 DIAGNOSIS — G8929 Other chronic pain: Secondary | ICD-10-CM

## 2021-03-26 DIAGNOSIS — S060X0D Concussion without loss of consciousness, subsequent encounter: Secondary | ICD-10-CM

## 2021-03-26 DIAGNOSIS — M25512 Pain in left shoulder: Secondary | ICD-10-CM | POA: Diagnosis not present

## 2021-03-26 NOTE — Patient Instructions (Addendum)
Good to see you  Continue PT  3 week follow up  

## 2021-03-28 ENCOUNTER — Ambulatory Visit: Payer: BC Managed Care – PPO

## 2021-03-28 ENCOUNTER — Other Ambulatory Visit: Payer: Self-pay

## 2021-03-28 DIAGNOSIS — M25512 Pain in left shoulder: Secondary | ICD-10-CM | POA: Diagnosis not present

## 2021-03-28 DIAGNOSIS — G8929 Other chronic pain: Secondary | ICD-10-CM | POA: Diagnosis not present

## 2021-03-28 DIAGNOSIS — R293 Abnormal posture: Secondary | ICD-10-CM | POA: Diagnosis not present

## 2021-03-28 DIAGNOSIS — M25612 Stiffness of left shoulder, not elsewhere classified: Secondary | ICD-10-CM | POA: Diagnosis not present
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
## 2021-04-02 ENCOUNTER — Other Ambulatory Visit: Payer: Self-pay

## 2021-04-02 ENCOUNTER — Ambulatory Visit: Payer: BC Managed Care – PPO | Admitting: Physical Therapy

## 2021-04-02 DIAGNOSIS — G8929 Other chronic pain: Secondary | ICD-10-CM | POA: Diagnosis not present

## 2021-04-02 DIAGNOSIS — G4733 Obstructive sleep apnea (adult) (pediatric): Secondary | ICD-10-CM | POA: Diagnosis not present

## 2021-04-02 DIAGNOSIS — M25612 Stiffness of left shoulder, not elsewhere classified: Secondary | ICD-10-CM

## 2021-04-02 DIAGNOSIS — R293 Abnormal posture: Secondary | ICD-10-CM | POA: Diagnosis not present

## 2021-04-02 DIAGNOSIS — M25512 Pain in left shoulder: Secondary | ICD-10-CM | POA: Diagnosis not present
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
## 2021-04-05 DIAGNOSIS — J3089 Other allergic rhinitis: Secondary | ICD-10-CM | POA: Diagnosis not present

## 2021-04-05 DIAGNOSIS — J3081 Allergic rhinitis due to animal (cat) (dog) hair and dander: Secondary | ICD-10-CM | POA: Diagnosis not present

## 2021-04-05 DIAGNOSIS — J301 Allergic rhinitis due to pollen: Secondary | ICD-10-CM | POA: Diagnosis not present

## 2021-04-09 ENCOUNTER — Ambulatory Visit: Payer: BC Managed Care – PPO | Admitting: Physical Therapy

## 2021-04-09 ENCOUNTER — Other Ambulatory Visit: Payer: Self-pay

## 2021-04-09 DIAGNOSIS — M25612 Stiffness of left shoulder, not elsewhere classified: Secondary | ICD-10-CM

## 2021-04-09 DIAGNOSIS — M25512 Pain in left shoulder: Secondary | ICD-10-CM | POA: Diagnosis not present

## 2021-04-09 DIAGNOSIS — R293 Abnormal posture: Secondary | ICD-10-CM | POA: Diagnosis not present

## 2021-04-09 DIAGNOSIS — G8929 Other chronic pain: Secondary | ICD-10-CM | POA: Diagnosis not present

## 2021-04-09 DIAGNOSIS — J3089 Other allergic rhinitis: Secondary | ICD-10-CM | POA: Diagnosis not present
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
## 2021-04-12 DIAGNOSIS — J301 Allergic rhinitis due to pollen: Secondary | ICD-10-CM | POA: Diagnosis not present

## 2021-04-12 DIAGNOSIS — J3089 Other allergic rhinitis: Secondary | ICD-10-CM | POA: Diagnosis not present

## 2021-04-12 DIAGNOSIS — J3081 Allergic rhinitis due to animal (cat) (dog) hair and dander: Secondary | ICD-10-CM | POA: Diagnosis not present

## 2021-04-16 ENCOUNTER — Ambulatory Visit: Payer: BC Managed Care – PPO | Attending: Sports Medicine

## 2021-04-16 DIAGNOSIS — M25512 Pain in left shoulder: Secondary | ICD-10-CM | POA: Diagnosis not present

## 2021-04-16 DIAGNOSIS — G8929 Other chronic pain: Secondary | ICD-10-CM | POA: Diagnosis not present

## 2021-04-16 DIAGNOSIS — M25612 Stiffness of left shoulder, not elsewhere classified: Secondary | ICD-10-CM

## 2021-04-16 DIAGNOSIS — R293 Abnormal posture: Secondary | ICD-10-CM
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
## 2021-04-17 ENCOUNTER — Ambulatory Visit (INDEPENDENT_AMBULATORY_CARE_PROVIDER_SITE_OTHER): Payer: BC Managed Care – PPO | Admitting: Sports Medicine

## 2021-04-17 VITALS — BP 120/80 | HR 83 | Ht 72.0 in | Wt 267.0 lb

## 2021-04-17 DIAGNOSIS — G8929 Other chronic pain: Secondary | ICD-10-CM | POA: Diagnosis not present

## 2021-04-17 DIAGNOSIS — S060X0D Concussion without loss of consciousness, subsequent encounter: Secondary | ICD-10-CM | POA: Diagnosis not present

## 2021-04-17 DIAGNOSIS — M25512 Pain in left shoulder: Secondary | ICD-10-CM

## 2021-04-17 NOTE — Patient Instructions (Addendum)
Good to see you  4 week follow up   

## 2021-04-24 ENCOUNTER — Ambulatory Visit: Payer: BC Managed Care – PPO

## 2021-04-24 DIAGNOSIS — R293 Abnormal posture: Secondary | ICD-10-CM

## 2021-04-24 DIAGNOSIS — M25612 Stiffness of left shoulder, not elsewhere classified: Secondary | ICD-10-CM

## 2021-04-24 DIAGNOSIS — M25512 Pain in left shoulder: Secondary | ICD-10-CM | POA: Diagnosis not present

## 2021-04-24 DIAGNOSIS — G8929 Other chronic pain: Secondary | ICD-10-CM
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
## 2021-05-03 DIAGNOSIS — G4733 Obstructive sleep apnea (adult) (pediatric): Secondary | ICD-10-CM | POA: Diagnosis not present
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
## 2021-05-16 ENCOUNTER — Ambulatory Visit (INDEPENDENT_AMBULATORY_CARE_PROVIDER_SITE_OTHER): Payer: BC Managed Care – PPO | Admitting: Sports Medicine

## 2021-05-16 VITALS — BP 144/88 | HR 82 | Ht 72.0 in | Wt 268.0 lb

## 2021-05-16 DIAGNOSIS — M25562 Pain in left knee: Secondary | ICD-10-CM

## 2021-05-16 DIAGNOSIS — M25512 Pain in left shoulder: Secondary | ICD-10-CM | POA: Diagnosis not present

## 2021-05-16 DIAGNOSIS — J3081 Allergic rhinitis due to animal (cat) (dog) hair and dander: Secondary | ICD-10-CM | POA: Diagnosis not present

## 2021-05-16 DIAGNOSIS — G8929 Other chronic pain: Secondary | ICD-10-CM | POA: Diagnosis not present

## 2021-05-16 DIAGNOSIS — M25572 Pain in left ankle and joints of left foot: Secondary | ICD-10-CM

## 2021-05-16 DIAGNOSIS — M25571 Pain in right ankle and joints of right foot: Secondary | ICD-10-CM

## 2021-05-16 DIAGNOSIS — J301 Allergic rhinitis due to pollen: Secondary | ICD-10-CM | POA: Diagnosis not present

## 2021-05-16 DIAGNOSIS — M25561 Pain in right knee: Secondary | ICD-10-CM | POA: Diagnosis not present

## 2021-05-16 DIAGNOSIS — J3089 Other allergic rhinitis: Secondary | ICD-10-CM | POA: Diagnosis not present

## 2021-05-16 NOTE — Patient Instructions (Addendum)
Referral for PT ?See you again in 4 weeks ?

## 2021-05-29 ENCOUNTER — Ambulatory Visit: Payer: BC Managed Care – PPO

## 2021-05-31 ENCOUNTER — Encounter: Payer: Self-pay | Admitting: Physical Therapy

## 2021-05-31 ENCOUNTER — Ambulatory Visit: Payer: BC Managed Care – PPO | Attending: Family Medicine | Admitting: Physical Therapy

## 2021-05-31 DIAGNOSIS — M25561 Pain in right knee: Secondary | ICD-10-CM | POA: Diagnosis not present

## 2021-05-31 DIAGNOSIS — M25512 Pain in left shoulder: Secondary | ICD-10-CM | POA: Insufficient documentation

## 2021-05-31 DIAGNOSIS — G8929 Other chronic pain: Secondary | ICD-10-CM

## 2021-05-31 DIAGNOSIS — M25562 Pain in left knee: Secondary | ICD-10-CM | POA: Insufficient documentation

## 2021-05-31 DIAGNOSIS — M25571 Pain in right ankle and joints of right foot: Secondary | ICD-10-CM | POA: Diagnosis not present

## 2021-05-31 DIAGNOSIS — M25572 Pain in left ankle and joints of left foot: Secondary | ICD-10-CM | POA: Diagnosis not present
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
## 2021-06-02 DIAGNOSIS — G4733 Obstructive sleep apnea (adult) (pediatric): Secondary | ICD-10-CM | POA: Diagnosis not present

## 2021-06-04 ENCOUNTER — Ambulatory Visit: Payer: BC Managed Care – PPO

## 2021-06-04 DIAGNOSIS — G8929 Other chronic pain: Secondary | ICD-10-CM

## 2021-06-04 DIAGNOSIS — M25572 Pain in left ankle and joints of left foot: Secondary | ICD-10-CM

## 2021-06-04 DIAGNOSIS — M25571 Pain in right ankle and joints of right foot: Secondary | ICD-10-CM | POA: Diagnosis not present

## 2021-06-04 DIAGNOSIS — M25562 Pain in left knee: Secondary | ICD-10-CM | POA: Diagnosis not present

## 2021-06-04 DIAGNOSIS — M25512 Pain in left shoulder: Secondary | ICD-10-CM | POA: Diagnosis not present

## 2021-06-04 DIAGNOSIS — M25561 Pain in right knee: Secondary | ICD-10-CM | POA: Diagnosis not present

## 2021-06-04 NOTE — Therapy (Signed)
OUTPATIENT PHYSICAL THERAPY TREATMENT   Patient Name: Brent GuarneriJames Gordon MRN: [id number] DOB:05/30/1960, 61 y.o., male Today's Date: 06/04/2021   PT End of Session - 06/04/21 0804     Visit Number 2    Date for PT Re-Evaluation 07/26/21    Authorization Type BCBS    PT Start Time 0731    PT Stop Time 0802    PT Time Calculation (min) 31 min    Activity Tolerance Patient tolerated treatment well    [facility] for tasks assessed/performed              Past Medical History:  Diagnosis Date   Allergy    sees Dr. Vergia Albertsobert Van Winkle   Obesity    Pain, low back    Sleep apnea    PSG 12/01/06 see's Dr Coralyn HellingVineet Sood   Varicose veins    Past Surgical History:  Procedure Laterality Date   COLONOSCOPY  08/01/2019   Dr. Charlott RakesVincent Schooler, diverticulosis, internal hemorrhoids, no polyps, repeat in 5 yrs   FOOT SURGERY Left 09/26/2011   triple arthrodesis per Dr. Toni ArthursJohn Hewitt   HERNIA REPAIR  2005/2006   Dr. Abbey Chattersosenbower   leg vein ablation     right lower, Dr. Consuela MimesMark Featherston   TONSILLECTOMY  01/13/1965   Patient Active Problem List   Diagnosis Date Noted   Concussion 10/16/2020   Knee pain 05/16/2016   OBSTRUCTIVE SLEEP APNEA 10/16/2006   Acute low back pain with radicular symptoms, duration less than 6 weeks 09/24/2006    PCP: Dr. Gershon CraneStephen Fry  REFERRING PROVIDER: Dr. Antoine PrimasZachary Smith  REFERRING DIAG: [id number], G89.29 chronic left shoulder pain;  M25.561, M25.562, G89.29 Chronic pain of both knees; M25.571 chronic pain of both ankles  THERAPY DIAG: bil knee pain; bil ankle pain; left shoulder pain; weakness   Rationale for Evaluation and Treatment Rehabilitation  ONSET DATE: 01/13/21  SUBJECTIVE:   SUBJECTIVE STATEMENT: I think the exercises are going to help.  I have a lot of chronic pain and it impacts how I move.  PERTINENT HISTORY: Acquired flat foot syndrome Left foot surgery 7 screws triple arthrodesis;  left knee meniscus repair 2 years ago Wears off  the shelf orthotics  PAIN:  Are you having pain? Yes NPRS scale: 2-3/10 Pain location: right ankle pain  Aggravating factors: AM, walk on soft sand Relieving factors: after get warmed up  PRECAUTIONS: None  WEIGHT BEARING RESTRICTIONS No  FALLS:  Has patient fallen in last 6 months? No  LIVING ENVIRONMENT: Lives with: lives with their spouse Lives in: House/apartment Stairs:  OCCUPATION: sitting mostly  PLOF: Independent  PATIENT GOALS strengthen legs and core   OBJECTIVE: 05/31/21  DIAGNOSTIC FINDINGS: not recently; presurgery  PATIENT SURVEYS:  FOTO 71%   COGNITION:  Overall cognitive status: Within functional limits for tasks assessed       MUSCLE LENGTH: Hamstrings: Right 65 deg; Left 50 deg Thomas test: Right 0 deg; Left 5 deg Decreased bil gastroc lengths  POSTURE:   Right > left pes planus (severe on right)   LOWER EXTREMITY ROM:  Left ankle dorsiflexion 5 degrees; right 10 degrees;  no left ankle/foot inversion and eversion secondary to surgical hardware  LOWER EXTREMITY MMT:  MMT Right eval Left eval  Hip flexion 4+5 4+  Hip extension 4 4  Hip abduction 3+ 4-  Hip adduction    Hip internal rotation    Hip external rotation    Knee flexion    Knee extension 4- 4  Ankle dorsiflexion 4 4  Ankle plantarflexion 4 4  Ankle inversion    Ankle eversion     (Blank rows = not tested)  Abdominal strength 4-/5  Pelvic drop with quadruped and 6 inch step down test   FUNCTIONAL TESTS:  SLS left 3 sec, right 2 sec;  able to rise from chair without UE support  Able to go up and down steps reciprocally with light railing support  GAIT: Comments: dec stride length  Left shoulder ROM: (seated) flexion 146, abduction 108, external rotation 40 degrees, internal rotation behind back to L4 Left shoulder MMT: flexion 4/5 with pain, abduction 4/5 with pain; external and internal rotation 4+/5  TODAY'S TREATMENT: Treatment on date: 06/04/21 Reivew of  HEP: sidelying clams, hip abduction (pt required verbal cues for alignment and to reduce substitution), plank on knees  Gastroc stretch 3x20 seconds Single leg stance 3x10 seconds Rt and Lt Hamstring stretch seated: 3x20 seconds  Sit to stand with yellow band around thighs x15   05/31/21: Treatment plan and initial HEP   PATIENT EDUCATION:  Education details: Access Code: [email or web address] Person educated: Patient Education method: Explanation, Demonstration, and Handouts Education comprehension: verbalized understanding   HOME EXERCISE PROGRAM: Access Code: [email or web address] URL: [email or web address] Date: 06/04/2021 Prepared by: Tresa Endo  Exercises - Clamshell  - 1 x daily - 7 x weekly - 1 sets - 10 reps - Sidelying Hip Abduction  - 1 x daily - 7 x weekly - 1 sets - 10 reps - Hip Flexor Stretch at Edge of Bed  - 1 x daily - 7 x weekly - 1 sets - 3 reps - 20 hold - Plank on Knees  - 1 x daily - 7 x weekly - 1 sets - 10 reps - Gastroc Stretch on Wall  - 3 x daily - 7 x weekly - 1 sets - 3 reps - 10 hold - Standing Single Leg Stance with Counter Support  - 2 x daily - 7 x weekly - 1 sets - 3 reps - 10 hold - Sit to Stand Without Arm Support  - 2 x daily - 7 x weekly - 2 sets - 10 reps - Seated Hamstring Stretch  - 3 x daily - 7 x weekly - 1 sets - 3 reps - 20 hold - Heel Raises with Counter Support  - 2 x daily - 7 x weekly - 2 sets - 10 reps ASSESSMENT:  CLINICAL IMPRESSION: First time follow-up after evaluation.  Pt is challenged with hip abduction in sidelying and requires verbal and tactile cues to reduce substitution and ER of hip.  PT added ankle flexibility and stability to HEP to address ankle and knee flexibility and stability. Pt with chronic condition and exercises will address this. Patient will benefit from skilled PT to address the below impairments and improve overall function.    OBJECTIVE IMPAIRMENTS decreased activity tolerance, difficulty walking, decreased ROM,  decreased strength, impaired flexibility, and pain.   ACTIVITY LIMITATIONS shopping, community activity, occupation, and yard work.   PERSONAL FACTORS Past/current experiences and Time since onset of injury/illness/exacerbation are also affecting patient's functional outcome.    REHAB POTENTIAL: Good  CLINICAL DECISION MAKING: Stable/uncomplicated  EVALUATION COMPLEXITY: Low   GOALS: Goals reviewed with patient? Yes  SHORT TERM GOALS: Target date: 06/28/21   The patient will demonstrate knowledge of basic self care strategies and exercises to promote healing   Baseline: Goal status: INITIAL  2.  The patient will have improved knee and hip  strength to at least 4/5 needed for standing, walking longer distances and descending stairs at home and in the community  Baseline:  Goal status: INITIAL  3.  The patient will report a 40% improvement in pain levels with functional activities which are currently difficult including mobility in the mornings Baseline:  Goal status: INITIAL  4.  The patient will have improved shoulder elevation ROM to at least 160 degrees needed for grooming/dressing purposes as well as reaching high shelves  Baseline:  Goal status: INITIAL   LONG TERM GOALS: Target date: 07/26/21   The patient will be independent in a safe self progression of a home exercise program to promote further recovery of function   Baseline:  Goal status: INITIAL  2.  The patient will have lumbo pelvic hip strength to grossly 4+/5 needed for heavier duty yard work and running Baseline:  Goal status: INITIAL  3.  The patient will have improved LE strength and proprioception needed to single leg stand 8-10 sec  Baseline:  Goal status: INITIAL  4.  The patient will have improved LE strength to descend steps without medial knee collapse and trunk lean  Baseline:  Goal status: INITIAL  5.  The patient will have improved FOTO score to    74%   indicating improved function with  less pain  Baseline:  Goal status: INITIAL   PLAN: PT FREQUENCY: 2x/week  PT DURATION: 8 weeks  PLANNED INTERVENTIONS: Therapeutic exercises, Therapeutic activity, Neuromuscular re-education, Balance training, Gait training, Patient/Family education, Joint mobilization, Aquatic Therapy, Dry Needling, Taping, Ultrasound, Ionotophoresis 4mg /ml Dexamethasone, and Manual therapy  PLAN FOR NEXT SESSION: review new HEP, foot intrinsic strengthening  , PT 06/04/21 8:05 AM   [facility] [address] [place], [place] Phone # [phone] Fax [phone]

## 2021-06-06 ENCOUNTER — Ambulatory Visit: Payer: BC Managed Care – PPO

## 2021-06-06 DIAGNOSIS — G8929 Other chronic pain: Secondary | ICD-10-CM

## 2021-06-06 DIAGNOSIS — M25562 Pain in left knee: Secondary | ICD-10-CM | POA: Diagnosis not present

## 2021-06-06 DIAGNOSIS — M25572 Pain in left ankle and joints of left foot: Secondary | ICD-10-CM | POA: Diagnosis not present

## 2021-06-06 DIAGNOSIS — M25571 Pain in right ankle and joints of right foot: Secondary | ICD-10-CM | POA: Diagnosis not present

## 2021-06-06 DIAGNOSIS — M25561 Pain in right knee: Secondary | ICD-10-CM | POA: Diagnosis not present

## 2021-06-06 DIAGNOSIS — M25512 Pain in left shoulder: Secondary | ICD-10-CM | POA: Diagnosis not present

## 2021-06-06 NOTE — Therapy (Signed)
OUTPATIENT PHYSICAL THERAPY TREATMENT   Patient Name: Brent Gordon MRN: [id number] DOB:04/21/1960, 61 y.o., male Today's Date: 06/06/2021   PT End of Session - 06/06/21 0803     Visit Number 3    Date for PT Re-Evaluation 07/26/21    Authorization Type BCBS    PT Start Time 0728    PT Stop Time 0802    PT Time Calculation (min) 34 min    Activity Tolerance Patient tolerated treatment well    Behavior During Therapy [facility] for tasks assessed/performed               Past Medical History:  Diagnosis Date   Allergy    sees Dr. Lowella Grip   Obesity    Pain, low back    Sleep apnea    PSG 12/01/06 see's Dr Chesley Mires   Varicose veins    Past Surgical History:  Procedure Laterality Date   COLONOSCOPY  08/01/2019   Dr. Wilford Corner, diverticulosis, internal hemorrhoids, no polyps, repeat in 5 yrs   FOOT SURGERY Left 09/26/2011   triple arthrodesis per Dr. Wylene Simmer   HERNIA REPAIR  2005/2006   Dr. Zella Richer   leg vein ablation     right lower, Dr. Linus Mako   TONSILLECTOMY  01/13/1965   Patient Active Problem List   Diagnosis Date Noted   Concussion 10/16/2020   Knee pain 05/16/2016   OBSTRUCTIVE SLEEP APNEA 10/16/2006   Acute low back pain with radicular symptoms, duration less than 6 weeks 09/24/2006    PCP: Dr. Alysia Penna  REFERRING PROVIDER: Dr. Hulan Saas  REFERRING DIAG: [id number], G89.29 chronic left shoulder pain;  M25.561, M25.562, G89.29 Chronic pain of both knees; M25.571 chronic pain of both ankles  THERAPY DIAG: bil knee pain; bil ankle pain; left shoulder pain; weakness   Rationale for Evaluation and Treatment Rehabilitation  ONSET DATE: 01/13/21  SUBJECTIVE:   SUBJECTIVE STATEMENT: I am sore in my legs and ankles.  PERTINENT HISTORY: Acquired flat foot syndrome Left foot surgery 7 screws triple arthrodesis;  left knee meniscus repair 2 years ago Wears off the shelf orthotics  PAIN:  Are you having pain? Yes NPRS  scale: 2-3/10 Pain location: right ankle pain  Aggravating factors: AM, walk on soft sand Relieving factors: after get warmed up  PRECAUTIONS: None  WEIGHT BEARING RESTRICTIONS No  FALLS:  Has patient fallen in last 6 months? No  LIVING ENVIRONMENT: Lives with: lives with their spouse Lives in: House/apartment Stairs:  OCCUPATION: sitting mostly  PLOF: Independent  PATIENT GOALS strengthen legs and core   OBJECTIVE: 05/31/21  DIAGNOSTIC FINDINGS: not recently; presurgery  PATIENT SURVEYS:  FOTO 71%   COGNITION:  Overall cognitive status: Within functional limits for tasks assessed     MUSCLE LENGTH: Hamstrings: Right 65 deg; Left 50 deg Thomas test: Right 0 deg; Left 5 deg Decreased bil gastroc lengths  POSTURE:   Right > left pes planus (severe on right)   LOWER EXTREMITY ROM:  Left ankle dorsiflexion 5 degrees; right 10 degrees;  no left ankle/foot inversion and eversion secondary to surgical hardware  LOWER EXTREMITY MMT:  MMT Right eval Left eval  Hip flexion 4+5 4+  Hip extension 4 4  Hip abduction 3+ 4-  Hip adduction    Hip internal rotation    Hip external rotation    Knee flexion    Knee extension 4- 4  Ankle dorsiflexion 4 4  Ankle plantarflexion 4 4  Ankle inversion  Ankle eversion     (Blank rows = not tested)  Abdominal strength 4-/5  Pelvic drop with quadruped and 6 inch step down test   FUNCTIONAL TESTS:  SLS left 3 sec, right 2 sec;  able to rise from chair without UE support  Able to go up and down steps reciprocally with light railing support  GAIT: Comments: dec stride length  Left shoulder ROM: (seated) flexion 146, abduction 108, external rotation 40 degrees, internal rotation behind back to L4 Left shoulder MMT: flexion 4/5 with pain, abduction 4/5 with pain; external and internal rotation 4+/5  TODAY'S TREATMENT: Treatment on date: 06/06/21 NuStep: Level 3 arms/legs x 6 min-PT present to monitor pt for pain Marble  pick-up-1 cup each  Standing rockerboard: x2 min  Gastroc stretch 3x20 seconds Single leg stance 3x10 seconds Rt and Lt Hamstring stretch seated: 3x20 seconds  Sit to stand with yellow band around thighs x15   Treatment on date: 06/04/21 Reivew of HEP: sidelying clams, hip abduction (pt required verbal cues for alignment and to reduce substitution), plank on knees  Gastroc stretch 3x20 seconds Single leg stance 3x10 seconds Rt and Lt Hamstring stretch seated: 3x20 seconds  Sit to stand with yellow band around thighs x15   05/31/21: Treatment plan and initial HEP   PATIENT EDUCATION:  Education details: Access Code: [email or web address] Person educated: Patient Education method: Explanation, Demonstration, and Handouts Education comprehension: verbalized understanding   HOME EXERCISE PROGRAM: Access Code: [email or web address] URL: [email or web address] Date: 06/04/2021 Prepared by: Claiborne Billings  Exercises - Clamshell  - 1 x daily - 7 x weekly - 1 sets - 10 reps - Sidelying Hip Abduction  - 1 x daily - 7 x weekly - 1 sets - 10 reps - Hip Flexor Stretch at Edge of Bed  - 1 x daily - 7 x weekly - 1 sets - 3 reps - 20 hold - Plank on Knees  - 1 x daily - 7 x weekly - 1 sets - 10 reps - Gastroc Stretch on Wall  - 3 x daily - 7 x weekly - 1 sets - 3 reps - 10 hold - Standing Single Leg Stance with Counter Support  - 2 x daily - 7 x weekly - 1 sets - 3 reps - 10 hold - Sit to Stand Without Arm Support  - 2 x daily - 7 x weekly - 2 sets - 10 reps - Seated Hamstring Stretch  - 3 x daily - 7 x weekly - 1 sets - 3 reps - 20 hold - Heel Raises with Counter Support  - 2 x daily - 7 x weekly - 2 sets - 10 reps ASSESSMENT:  CLINICAL IMPRESSION: Pt arrived with LE soreness from initiating HEP for LE strength and flexibility. Pt did well with all strength and flexibility exercises in the clinic today.  He is challenged with heel raises in standing and requires verbla cues for alignment.  Pt will get marbles  for home to work on foot intrinsic strength.  Pt with chronic condition and exercises will address this. Patient will benefit from skilled PT to address the below impairments and improve overall function.    OBJECTIVE IMPAIRMENTS decreased activity tolerance, difficulty walking, decreased ROM, decreased strength, impaired flexibility, and pain.   ACTIVITY LIMITATIONS shopping, community activity, occupation, and yard work.   PERSONAL FACTORS Past/current experiences and Time since onset of injury/illness/exacerbation are also affecting patient's functional outcome.    REHAB POTENTIAL: Good  CLINICAL DECISION MAKING: Stable/uncomplicated  EVALUATION  COMPLEXITY: Low   GOALS: Goals reviewed with patient? Yes  SHORT TERM GOALS: Target date: 06/28/21   The patient will demonstrate knowledge of basic self care strategies and exercises to promote healing   Baseline: Goal status: INITIAL  2.  The patient will have improved knee and hip  strength to at least 4/5 needed for standing, walking longer distances and descending stairs at home and in the community  Baseline:  Goal status: INITIAL  3.  The patient will report a 40% improvement in pain levels with functional activities which are currently difficult including mobility in the mornings Baseline:  Goal status: INITIAL  4.  The patient will have improved shoulder elevation ROM to at least 160 degrees needed for grooming/dressing purposes as well as reaching high shelves  Baseline:  Goal status: INITIAL   LONG TERM GOALS: Target date: 07/26/21   The patient will be independent in a safe self progression of a home exercise program to promote further recovery of function   Baseline:  Goal status: INITIAL  2.  The patient will have lumbo pelvic hip strength to grossly 4+/5 needed for heavier duty yard work and running Baseline:  Goal status: INITIAL  3.  The patient will have improved LE strength and proprioception needed to  single leg stand 8-10 sec  Baseline:  Goal status: INITIAL  4.  The patient will have improved LE strength to descend steps without medial knee collapse and trunk lean  Baseline:  Goal status: INITIAL  5.  The patient will have improved FOTO score to    74%   indicating improved function with less pain  Baseline:  Goal status: INITIAL   PLAN: PT FREQUENCY: 2x/week  PT DURATION: 8 weeks  PLANNED INTERVENTIONS: Therapeutic exercises, Therapeutic activity, Neuromuscular re-education, Balance training, Gait training, Patient/Family education, Joint mobilization, Aquatic Therapy, Dry Needling, Taping, Ultrasound, Ionotophoresis 4mg /ml Dexamethasone, and Manual therapy  PLAN FOR NEXT SESSION: Hip, knee, ankle strength and flexibility, foot intrinsic strengthening  Sigurd Sos, PT 06/06/21 8:03 AM   [facility] [address], [place], [place] [address] Phone # [phone] Fax [phone]

## 2021-06-12 DIAGNOSIS — J3089 Other allergic rhinitis: Secondary | ICD-10-CM | POA: Diagnosis not present
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
## 2021-06-13 ENCOUNTER — Ambulatory Visit: Payer: BC Managed Care – PPO | Attending: Family Medicine

## 2021-06-13 ENCOUNTER — Ambulatory Visit (INDEPENDENT_AMBULATORY_CARE_PROVIDER_SITE_OTHER): Payer: BC Managed Care – PPO | Admitting: Sports Medicine

## 2021-06-13 VITALS — BP 130/75 | HR 68 | Ht 72.0 in | Wt 268.0 lb

## 2021-06-13 DIAGNOSIS — M25512 Pain in left shoulder: Secondary | ICD-10-CM

## 2021-06-13 DIAGNOSIS — G8929 Other chronic pain: Secondary | ICD-10-CM

## 2021-06-13 DIAGNOSIS — M25572 Pain in left ankle and joints of left foot: Secondary | ICD-10-CM | POA: Insufficient documentation

## 2021-06-13 DIAGNOSIS — R293 Abnormal posture: Secondary | ICD-10-CM | POA: Insufficient documentation

## 2021-06-13 DIAGNOSIS — M25612 Stiffness of left shoulder, not elsewhere classified: Secondary | ICD-10-CM | POA: Insufficient documentation

## 2021-06-13 DIAGNOSIS — M25571 Pain in right ankle and joints of right foot: Secondary | ICD-10-CM

## 2021-06-13 DIAGNOSIS — M25561 Pain in right knee: Secondary | ICD-10-CM

## 2021-06-13 DIAGNOSIS — M25562 Pain in left knee: Secondary | ICD-10-CM

## 2021-06-13 NOTE — Patient Instructions (Signed)
Good to see you   

## 2021-06-13 NOTE — Therapy (Signed)
OUTPATIENT PHYSICAL THERAPY TREATMENT   Patient Name: Brent Gordon MRN: [id number] DOB:06-28-1960, 61 y.o., male Today's Date: 06/13/2021   PT End of Session - 06/13/21 0841     Visit Number 4    Date for PT Re-Evaluation 07/26/21    Authorization Type BCBS    PT Start Time 0731    PT Stop Time 0801    PT Time Calculation (min) 30 min    Activity Tolerance Patient tolerated treatment well    Behavior During Therapy [facility] for tasks assessed/performed                Past Medical History:  Diagnosis Date   Allergy    sees Dr. Lowella Grip   Obesity    Pain, low back    Sleep apnea    PSG 12/01/06 see's Dr Chesley Mires   Varicose veins    Past Surgical History:  Procedure Laterality Date   COLONOSCOPY  08/01/2019   Dr. Wilford Corner, diverticulosis, internal hemorrhoids, no polyps, repeat in 5 yrs   FOOT SURGERY Left 09/26/2011   triple arthrodesis per Dr. Wylene Simmer   HERNIA REPAIR  2005/2006   Dr. Zella Richer   leg vein ablation     right lower, Dr. Linus Mako   TONSILLECTOMY  01/13/1965   Patient Active Problem List   Diagnosis Date Noted   Concussion 10/16/2020   Knee pain 05/16/2016   OBSTRUCTIVE SLEEP APNEA 10/16/2006   Acute low back pain with radicular symptoms, duration less than 6 weeks 09/24/2006    PCP: Dr. Alysia Penna  REFERRING PROVIDER: Dr. Hulan Saas  REFERRING DIAG: M62.863, G89.29 chronic left shoulder pain;  M25.561, M25.562, G89.29 Chronic pain of both knees; M25.571 chronic pain of both ankles  THERAPY DIAG: bil knee pain; bil ankle pain; left shoulder pain; weakness   Rationale for Evaluation and Treatment Rehabilitation  ONSET DATE: 01/13/21  SUBJECTIVE:   SUBJECTIVE STATEMENT: I had pain all over this weekend.   I'm doing my exercises   PERTINENT HISTORY: Acquired flat foot syndrome Left foot surgery 7 screws triple arthrodesis;  left knee meniscus repair 2 years ago Wears off the shelf orthotics  PAIN:  Are  you having pain? Yes NPRS scale: 2-3/10 Pain location: right ankle pain  Aggravating factors: AM, walk on soft sand Relieving factors: after get warmed up  PRECAUTIONS: None  WEIGHT BEARING RESTRICTIONS No  FALLS:  Has patient fallen in last 6 months? No  LIVING ENVIRONMENT: Lives with: lives with their spouse Lives in: House/apartment Stairs:  OCCUPATION: sitting mostly  PLOF: Independent  PATIENT GOALS strengthen legs and core   OBJECTIVE: 05/31/21  DIAGNOSTIC FINDINGS: not recently; presurgery  PATIENT SURVEYS:  FOTO 71%   COGNITION:  Overall cognitive status: Within functional limits for tasks assessed     MUSCLE LENGTH: Hamstrings: Right 65 deg; Left 50 deg Thomas test: Right 0 deg; Left 5 deg Decreased bil gastroc lengths  POSTURE:   Right > left pes planus (severe on right)   LOWER EXTREMITY ROM:  Left ankle dorsiflexion 5 degrees; right 10 degrees;  no left ankle/foot inversion and eversion secondary to surgical hardware  LOWER EXTREMITY MMT:  MMT Right eval Left eval  Hip flexion 4+5 4+  Hip extension 4 4  Hip abduction 3+ 4-  Hip adduction    Hip internal rotation    Hip external rotation    Knee flexion    Knee extension 4- 4  Ankle dorsiflexion 4 4  Ankle plantarflexion  4 4  Ankle inversion    Ankle eversion     (Blank rows = not tested)  Abdominal strength 4-/5  Pelvic drop with quadruped and 6 inch step down test   FUNCTIONAL TESTS:  SLS left 3 sec, right 2 sec;  able to rise from chair without UE support  Able to go up and down steps reciprocally with light railing support  GAIT: Comments: dec stride length  Left shoulder ROM: (seated) flexion 146, abduction 108, external rotation 40 degrees, internal rotation behind back to L4 Left shoulder MMT: flexion 4/5 with pain, abduction 4/5 with pain; external and internal rotation 4+/5  TODAY'S TREATMENT: Treatment on date: 06/13/21 NuStep: Level 3 arms/legs x 6 min-PT present to  monitor pt for pain Standing hip abduction and extension 3# added 2x10 bil- verbal cues for alignment Standing rockerboard: x2 min  6" step down 2x10 Single leg stance 3x10 seconds Rt and Lt Hamstring stretch seated: 3x20 seconds  Sit to stand with blue band around thighs x15   Treatment on date: 06/06/21 NuStep: Level 3 arms/legs x 6 min-PT present to monitor pt for pain Marble pick-up-1 cup each  Standing rockerboard: x2 min  Gastroc stretch 3x20 seconds Single leg stance 3x10 seconds Rt and Lt Hamstring stretch seated: 3x20 seconds  Sit to stand with yellow band around thighs x15   Treatment on date: 06/04/21 Reivew of HEP: sidelying clams, hip abduction (pt required verbal cues for alignment and to reduce substitution), plank on knees  Gastroc stretch 3x20 seconds Single leg stance 3x10 seconds Rt and Lt Hamstring stretch seated: 3x20 seconds  Sit to stand with yellow band around thighs x15    PATIENT EDUCATION:  Education details: Access Code: [email or web address] Person educated: Patient Education method: Consulting civil engineer, Media planner, and Handouts Education comprehension: verbalized understanding   HOME EXERCISE PROGRAM: Access Code: [email or web address] URL: [email or web address] Date: 06/04/2021 Prepared by: Claiborne Billings  Exercises - Clamshell  - 1 x daily - 7 x weekly - 1 sets - 10 reps - Sidelying Hip Abduction  - 1 x daily - 7 x weekly - 1 sets - 10 reps - Hip Flexor Stretch at Edge of Bed  - 1 x daily - 7 x weekly - 1 sets - 3 reps - 20 hold - Plank on Knees  - 1 x daily - 7 x weekly - 1 sets - 10 reps - Gastroc Stretch on Wall  - 3 x daily - 7 x weekly - 1 sets - 3 reps - 10 hold - Standing Single Leg Stance with Counter Support  - 2 x daily - 7 x weekly - 1 sets - 3 reps - 10 hold - Sit to Stand Without Arm Support  - 2 x daily - 7 x weekly - 2 sets - 10 reps - Seated Hamstring Stretch  - 3 x daily - 7 x weekly - 1 sets - 3 reps - 20 hold - Heel Raises with Counter Support  -  2 x daily - 7 x weekly - 2 sets - 10 reps ASSESSMENT:  CLINICAL IMPRESSION: Pt continues to work on strength and flexibility associated with chronic condition.   Session focused on strength and alignment with standing exercises. Pt was challenged by exercise today  PT provided verbal and tactile cues throughout session.  Patient will benefit from skilled PT to address the below impairments and improve overall function.     OBJECTIVE IMPAIRMENTS decreased activity tolerance, difficulty walking, decreased ROM, decreased strength, impaired flexibility, and pain.  ACTIVITY LIMITATIONS shopping, community activity, occupation, and yard work.   PERSONAL FACTORS Past/current experiences and Time since onset of injury/illness/exacerbation are also affecting patient's functional outcome.    REHAB POTENTIAL: Good  CLINICAL DECISION MAKING: Stable/uncomplicated  EVALUATION COMPLEXITY: Low   GOALS: Goals reviewed with patient? Yes  SHORT TERM GOALS: Target date: 06/28/21   The patient will demonstrate knowledge of basic self care strategies and exercises to promote healing   Baseline: Goal status: MET  2.  The patient will have improved knee and hip  strength to at least 4/5 needed for standing, walking longer distances and descending stairs at home and in the community  Baseline:  Goal status: INITIAL  3.  The patient will report a 40% improvement in pain levels with functional activities which are currently difficult including mobility in the mornings Baseline:  Goal status: INITIAL  4.  The patient will have improved shoulder elevation ROM to at least 160 degrees needed for grooming/dressing purposes as well as reaching high shelves  Baseline:  Goal status: INITIAL   LONG TERM GOALS: Target date: 07/26/21   The patient will be independent in a safe self progression of a home exercise program to promote further recovery of function   Baseline:  Goal status: INITIAL  2.  The  patient will have lumbo pelvic hip strength to grossly 4+/5 needed for heavier duty yard work and running Baseline:  Goal status: INITIAL  3.  The patient will have improved LE strength and proprioception needed to single leg stand 8-10 sec  Baseline:  Goal status: INITIAL  4.  The patient will have improved LE strength to descend steps without medial knee collapse and trunk lean  Baseline:  Goal status: INITIAL  5.  The patient will have improved FOTO score to    74%   indicating improved function with less pain  Baseline:  Goal status: INITIAL   PLAN: PT FREQUENCY: 2x/week  PT DURATION: 8 weeks  PLANNED INTERVENTIONS: Therapeutic exercises, Therapeutic activity, Neuromuscular re-education, Balance training, Gait training, Patient/Family education, Joint mobilization, Aquatic Therapy, Dry Needling, Taping, Ultrasound, Ionotophoresis 17m/ml Dexamethasone, and Manual therapy  PLAN FOR NEXT SESSION: Hip, knee, ankle strength and flexibility, foot intrinsic strengthening  KSigurd Sos PT 06/13/21 8:45 AM   [facility] [address] [place] [address]Phone # [phone] [phone]

## 2021-06-18 ENCOUNTER — Ambulatory Visit: Payer: BC Managed Care – PPO

## 2021-06-18 DIAGNOSIS — M25562 Pain in left knee: Secondary | ICD-10-CM | POA: Diagnosis not present

## 2021-06-18 DIAGNOSIS — M25572 Pain in left ankle and joints of left foot: Secondary | ICD-10-CM | POA: Diagnosis not present

## 2021-06-18 DIAGNOSIS — G8929 Other chronic pain: Secondary | ICD-10-CM | POA: Diagnosis not present

## 2021-06-18 DIAGNOSIS — M25512 Pain in left shoulder: Secondary | ICD-10-CM | POA: Diagnosis not present

## 2021-06-18 DIAGNOSIS — M25612 Stiffness of left shoulder, not elsewhere classified: Secondary | ICD-10-CM | POA: Diagnosis not present

## 2021-06-18 DIAGNOSIS — R293 Abnormal posture: Secondary | ICD-10-CM | POA: Diagnosis not present

## 2021-06-18 DIAGNOSIS — M25561 Pain in right knee: Secondary | ICD-10-CM | POA: Diagnosis not present

## 2021-06-18 DIAGNOSIS — M25571 Pain in right ankle and joints of right foot: Secondary | ICD-10-CM | POA: Diagnosis not present

## 2021-06-18 DIAGNOSIS — J3081 Allergic rhinitis due to animal (cat) (dog) hair and dander: Secondary | ICD-10-CM | POA: Diagnosis not present

## 2021-06-18 DIAGNOSIS — J3089 Other allergic rhinitis: Secondary | ICD-10-CM | POA: Diagnosis not present

## 2021-06-18 DIAGNOSIS — J301 Allergic rhinitis due to pollen: Secondary | ICD-10-CM | POA: Diagnosis not present

## 2021-06-18 NOTE — Therapy (Signed)
OUTPATIENT PHYSICAL THERAPY TREATMENT   Patient Name: Brent Gordon MRN: [id number] DOB:Mar 28, 1960, 61 y.o., male Today's Date: 06/18/2021   PT End of Session - 06/18/21 0759     Visit Number 5    Date for PT Re-Evaluation 07/26/21    Authorization Type BCBS    PT Start Time 0726    PT Stop Time 0800    PT Time Calculation (min) 34 min    Activity Tolerance Patient tolerated treatment well    Behavior During Therapy [facility] for tasks assessed/performed                 Past Medical History:  Diagnosis Date   Allergy    sees Dr. Lowella Grip   Obesity    Pain, low back    Sleep apnea    PSG 12/01/06 see's Dr Chesley Mires   Varicose veins    Past Surgical History:  Procedure Laterality Date   COLONOSCOPY  08/01/2019   Dr. Wilford Corner, diverticulosis, internal hemorrhoids, no polyps, repeat in 5 yrs   FOOT SURGERY Left 09/26/2011   triple arthrodesis per Dr. Wylene Simmer   HERNIA REPAIR  2005/2006   Dr. Zella Richer   leg vein ablation     right lower, Dr. Linus Mako   TONSILLECTOMY  01/13/1965   Patient Active Problem List   Diagnosis Date Noted   Concussion 10/16/2020   Knee pain 05/16/2016   OBSTRUCTIVE SLEEP APNEA 10/16/2006   Acute low back pain with radicular symptoms, duration less than 6 weeks 09/24/2006    PCP: Dr. Alysia Penna  REFERRING PROVIDER: Dr. Hulan Saas  REFERRING DIAG: K24.097, G89.29 chronic left shoulder pain;  M25.561, M25.562, G89.29 Chronic pain of both knees; M25.571 chronic pain of both ankles  THERAPY DIAG: bil knee pain; bil ankle pain; left shoulder pain; weakness   Rationale for Evaluation and Treatment Rehabilitation  ONSET DATE: 01/13/21  SUBJECTIVE:   SUBJECTIVE STATEMENT: I was painting this weekend and I am having pain in my sides below my arms.  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   PERTINENT HISTORY: Acquired flat foot syndrome Left foot surgery 7 screws triple arthrodesis;  left knee  meniscus repair 2 years ago Wears off the shelf orthotics  PAIN:  Are you having pain? Yes NPRS scale: 2-3/10 Pain location: right ankle pain  Aggravating factors: AM, walk on soft sand Relieving factors: after get warmed up  PRECAUTIONS: None  WEIGHT BEARING RESTRICTIONS No  FALLS:  Has patient fallen in last 6 months? No  LIVING ENVIRONMENT: Lives with: lives with their spouse Lives in: House/apartment Stairs:  OCCUPATION: sitting mostly  PLOF: Independent  PATIENT GOALS strengthen legs and core   OBJECTIVE: 05/31/21  DIAGNOSTIC FINDINGS: not recently; presurgery  PATIENT SURVEYS:  FOTO 71%   COGNITION:  Overall cognitive status: Within functional limits for tasks assessed     MUSCLE LENGTH: Hamstrings: Right 65 deg; Left 50 deg Thomas test: Right 0 deg; Left 5 deg Decreased bil gastroc lengths  POSTURE:   Right > left pes planus (severe on right)   LOWER EXTREMITY ROM:  Left ankle dorsiflexion 5 degrees; right 10 degrees;  no left ankle/foot inversion and eversion secondary to surgical hardware  LOWER EXTREMITY MMT:  MMT Right eval Left eval  Hip flexion 4+5 4+  Hip extension 4 4  Hip abduction 3+ 4-  Hip adduction    Hip internal rotation    Hip external rotation  Knee flexion    Knee extension 4- 4  Ankle dorsiflexion 4 4  Ankle plantarflexion 4 4  Ankle inversion    Ankle eversion     (Blank rows = not tested)  Abdominal strength 4-/5  Pelvic drop with quadruped and 6 inch step down test   FUNCTIONAL TESTS:  SLS left 3 sec, right 2 sec;  able to rise from chair without UE support  Able to go up and down steps reciprocally with light railing support  GAIT: Comments: dec stride length  Left shoulder ROM: (seated) flexion 146, abduction 108, external rotation 40 degrees, internal rotation behind back to L4 Left shoulder MMT: flexion 4/5 with pain, abduction 4/5 with pain; external and internal rotation 4+/5 06/18/21: Lt shoulder  flexion 145   TODAY'S TREATMENT: Treatment on date: 06/18/21 NuStep: Level 3 arms/legs x 6 min-PT present to monitor pt for pain Standing hip abduction and extension 3# added 2x10 bil- verbal cues for alignment Shoulder flexion 3# 2x10 Standing rockerboard: x2 min  6" step down 2x10 Single leg stance 3x10 seconds Rt and Lt Hamstring stretch seated: 3x20 seconds  Wall push-ups 2x10 Heel raises: 2x10  Treatment on date: 06/13/21 NuStep: Level 3 arms/legs x 6 min-PT present to monitor pt for pain Standing hip abduction and extension 3# added 2x10 bil- verbal cues for alignment Standing rockerboard: x2 min  6" step down 2x10 Single leg stance 3x10 seconds Rt and Lt Hamstring stretch seated: 3x20 seconds  Sit to stand with blue band around thighs x15   Treatment on date: 06/06/21 NuStep: Level 3 arms/legs x 6 min-PT present to monitor pt for pain Marble pick-up-1 cup each  Standing rockerboard: x2 min  Gastroc stretch 3x20 seconds Single leg stance 3x10 seconds Rt and Lt Hamstring stretch seated: 3x20 seconds  Sit to stand with yellow band around thighs x15    PATIENT EDUCATION:  Education details: Access Code: [email or web address] Person educated: Patient Education method: Consulting civil engineer, Media planner, and Handouts Education comprehension: verbalized understanding   HOME EXERCISE PROGRAM: Access Code: [email or web address] URL: [email or web address] Date: 06/04/2021 Prepared by: Claiborne Billings  Exercises - Clamshell  - 1 x daily - 7 x weekly - 1 sets - 10 reps - Sidelying Hip Abduction  - 1 x daily - 7 x weekly - 1 sets - 10 reps - Hip Flexor Stretch at Edge of Bed  - 1 x daily - 7 x weekly - 1 sets - 3 reps - 20 hold - Plank on Knees  - 1 x daily - 7 x weekly - 1 sets - 10 reps - Gastroc Stretch on Wall  - 3 x daily - 7 x weekly - 1 sets - 3 reps - 10 hold - Standing Single Leg Stance with Counter Support  - 2 x daily - 7 x weekly - 1 sets - 3 reps - 10 hold - Sit to Stand Without Arm Support   - 2 x daily - 7 x weekly - 2 sets - 10 reps - Seated Hamstring Stretch  - 3 x daily - 7 x weekly - 1 sets - 3 reps - 20 hold - Heel Raises with Counter Support  - 2 x daily - 7 x weekly - 2 sets - 10 reps ASSESSMENT:  CLINICAL IMPRESSION: Pt continues to work on strength and flexibility associated with chronic condition.  Pt reports some pain reduction with functional tasks at work such as driving a fork lift.  Pt was challenged with forward flexion with 3# weights today.  Session focused on strength and alignment with standing exercises. Pt was challenged by exercise today  PT provided verbal and tactile cues throughout session.  Patient will benefit from skilled PT to address the below impairments and improve overall function.     OBJECTIVE IMPAIRMENTS decreased activity tolerance, difficulty walking, decreased ROM, decreased strength, impaired flexibility, and pain.   ACTIVITY LIMITATIONS shopping, community activity, occupation, and yard work.   PERSONAL FACTORS Past/current experiences and Time since onset of injury/illness/exacerbation are also affecting patient's functional outcome.    REHAB POTENTIAL: Good  CLINICAL DECISION MAKING: Stable/uncomplicated  EVALUATION COMPLEXITY: Low   GOALS: Goals reviewed with patient? Yes  SHORT TERM GOALS: Target date: 06/28/21   The patient will demonstrate knowledge of basic self care strategies and exercises to promote healing   Baseline: Goal status: MET  2.  The patient will have improved knee and hip  strength to at least 4/5 needed for standing, walking longer distances and descending stairs at home and in the community  Baseline:  Goal status: INITIAL  3.  The patient will report a 40% improvement in pain levels with functional activities which are currently difficult including mobility in the mornings Baseline: able to do most activity, 10-20% reduced pain (06/18/21) Goal status: In progress  4.  The patient will have improved  shoulder elevation ROM to at least 160 degrees needed for grooming/dressing purposes as well as reaching high shelves  Baseline: 145 degrees (06/18/21) Goal status: in progress   LONG TERM GOALS: Target date: 07/26/21   The patient will be independent in a safe self progression of a home exercise program to promote further recovery of function   Baseline:  Goal status: INITIAL  2.  The patient will have lumbo pelvic hip strength to grossly 4+/5 needed for heavier duty yard work and running Baseline:  Goal status: INITIAL  3.  The patient will have improved LE strength and proprioception needed to single leg stand 8-10 sec  Baseline:  Goal status: INITIAL  4.  The patient will have improved LE strength to descend steps without medial knee collapse and trunk lean  Baseline:  Goal status: INITIAL  5.  The patient will have improved FOTO score to 74% indicating improved function with less pain  Baseline:  Goal status: INITIAL   PLAN: PT FREQUENCY: 2x/week  PT DURATION: 8 weeks  PLANNED INTERVENTIONS: Therapeutic exercises, Therapeutic activity, Neuromuscular re-education, Balance training, Gait training, Patient/Family education, Joint mobilization, Aquatic Therapy, Dry Needling, Taping, Ultrasound, Ionotophoresis 28m/ml Dexamethasone, and Manual therapy  PLAN FOR NEXT SESSION: Hip, knee, ankle strength and flexibility, foot intrinsic strengthening.  KSigurd Sos PT 06/18/21 8:02 AM   [address] [place] [address]Phone # [phone] [phone]

## 2021-06-20 DIAGNOSIS — J3081 Allergic rhinitis due to animal (cat) (dog) hair and dander: Secondary | ICD-10-CM | POA: Diagnosis not present

## 2021-06-20 DIAGNOSIS — J3089 Other allergic rhinitis: Secondary | ICD-10-CM | POA: Diagnosis not present

## 2021-06-20 DIAGNOSIS — J301 Allergic rhinitis due to pollen: Secondary | ICD-10-CM | POA: Diagnosis not present

## 2021-06-21 ENCOUNTER — Ambulatory Visit: Payer: BC Managed Care – PPO | Admitting: Physical Therapy

## 2021-06-25 ENCOUNTER — Ambulatory Visit: Payer: BC Managed Care – PPO

## 2021-06-25 DIAGNOSIS — M25571 Pain in right ankle and joints of right foot: Secondary | ICD-10-CM

## 2021-06-25 DIAGNOSIS — M25562 Pain in left knee: Secondary | ICD-10-CM | POA: Diagnosis not present

## 2021-06-25 DIAGNOSIS — M25612 Stiffness of left shoulder, not elsewhere classified: Secondary | ICD-10-CM | POA: Diagnosis not present

## 2021-06-25 DIAGNOSIS — M25572 Pain in left ankle and joints of left foot: Secondary | ICD-10-CM

## 2021-06-25 DIAGNOSIS — G8929 Other chronic pain: Secondary | ICD-10-CM

## 2021-06-25 DIAGNOSIS — M25512 Pain in left shoulder: Secondary | ICD-10-CM | POA: Diagnosis not present

## 2021-06-25 DIAGNOSIS — R293 Abnormal posture: Secondary | ICD-10-CM | POA: Diagnosis not present

## 2021-06-25 DIAGNOSIS — M25561 Pain in right knee: Secondary | ICD-10-CM | POA: Diagnosis not present

## 2021-06-25 NOTE — Therapy (Signed)
OUTPATIENT PHYSICAL THERAPY TREATMENT   Patient Name: Brent Gordon MRN: [id number] DOB:06/13/1960, 61 y.o., male Today's Date: 06/25/2021   PT End of Session - 06/25/21 0757     Visit Number 6    Date for PT Re-Evaluation 07/26/21    Authorization Type BCBS    PT Start Time 0728    PT Stop Time 0800    PT Time Calculation (min) 32 min    Activity Tolerance Patient tolerated treatment well    Behavior During Therapy WFL for tasks assessed/performed                  Past Medical History:  Diagnosis Date   Allergy    sees Dr. Robert Van Winkle   Obesity    Pain, low back    Sleep apnea    PSG 12/01/06 see's Dr Vineet Sood   Varicose veins    Past Surgical History:  Procedure Laterality Date   COLONOSCOPY  08/01/2019   Dr. Vincent Schooler, diverticulosis, internal hemorrhoids, no polyps, repeat in 5 yrs   FOOT SURGERY Left 09/26/2011   triple arthrodesis per Dr. John Hewitt   HERNIA REPAIR  2005/2006   Dr. Rosenbower   leg vein ablation     right lower, Dr. Mark Featherston   TONSILLECTOMY  01/13/1965   Patient Active Problem List   Diagnosis Date Noted   Concussion 10/16/2020   Knee pain 05/16/2016   OBSTRUCTIVE SLEEP APNEA 10/16/2006   Acute low back pain with radicular symptoms, duration less than 6 weeks 09/24/2006    PCP: Dr. Stephen Fry  REFERRING PROVIDER: Dr. Zachary Smith  REFERRING DIAG: M25.512, G89.29 chronic left shoulder pain;  M25.561, M25.562, G89.29 Chronic pain of both knees; M25.571 chronic pain of both ankles  THERAPY DIAG: bil knee pain; bil ankle pain; left shoulder pain; weakness   Rationale for Evaluation and Treatment Rehabilitation  ONSET DATE: 01/13/21  SUBJECTIVE:   SUBJECTIVE STATEMENT: I lifted a heavy planter this morning so my shoulder is sore.    PERTINENT HISTORY: Acquired flat foot syndrome Left foot surgery 7 screws triple arthrodesis;  left knee meniscus repair 2 years ago Wears off the shelf  orthotics  PAIN:  Are you having pain? Yes NPRS scale: 2-3/10 Pain location: right ankle pain  Aggravating factors: AM, walk on soft sand Relieving factors: after get warmed up  PRECAUTIONS: None  WEIGHT BEARING RESTRICTIONS No  FALLS:  Has patient fallen in last 6 months? No  LIVING ENVIRONMENT: Lives with: lives with their spouse Lives in: House/apartment Stairs:  OCCUPATION: sitting mostly  PLOF: Independent  PATIENT GOALS strengthen legs and core   OBJECTIVE: 05/31/21  DIAGNOSTIC FINDINGS: not recently; presurgery  PATIENT SURVEYS:  FOTO 71%   COGNITION:  Overall cognitive status: Within functional limits for tasks assessed     MUSCLE LENGTH: Hamstrings: Right 65 deg; Left 50 deg Thomas test: Right 0 deg; Left 5 deg Decreased bil gastroc lengths  POSTURE:   Right > left pes planus (severe on right)   LOWER EXTREMITY ROM:  Left ankle dorsiflexion 5 degrees; right 10 degrees;  no left ankle/foot inversion and eversion secondary to surgical hardware  LOWER EXTREMITY MMT:  MMT Right eval Left eval  Hip flexion 4+5 4+  Hip extension 4 4  Hip abduction 3+ 4-  Hip adduction    Hip internal rotation    Hip external rotation    Knee flexion    Knee extension 4- 4  Ankle dorsiflexion 4 4    Ankle plantarflexion 4 4  Ankle inversion    Ankle eversion     (Blank rows = not tested)  Abdominal strength 4-/5  Pelvic drop with quadruped and 6 inch step down test   FUNCTIONAL TESTS:  SLS left 3 sec, right 2 sec;  able to rise from chair without UE support  Able to go up and down steps reciprocally with light railing support  GAIT: Comments: dec stride length  Left shoulder ROM: (seated) flexion 146, abduction 108, external rotation 40 degrees, internal rotation behind back to L4 Left shoulder MMT: flexion 4/5 with pain, abduction 4/5 with pain; external and internal rotation 4+/5 06/18/21: Lt shoulder flexion 145   TODAY'S TREATMENT: Treatment on  date: 06/25/21 NuStep: Level 3 arms/legs x 6 min-PT present to discuss progress Standing hip abduction and extension 3# added 2x10 bil- verbal cues for alignment Shoulder flexion 3# 2x10 Standing rockerboard: x2 min  6" step down 2x10 Hamstring stretch seated: 3x20 seconds  Wall push-ups 2x10 Heel raises: 2x10  Treatment on date: 06/18/21 NuStep: Level 3 arms/legs x 6 min-PT present to monitor pt for pain Standing hip abduction and extension 3# added 2x10 bil- verbal cues for alignment Shoulder flexion 3# 2x10 Standing rockerboard: x2 min  6" step down 2x10 Single leg stance 3x10 seconds Rt and Lt Hamstring stretch seated: 3x20 seconds  Wall push-ups 2x10 Heel raises: 2x10  Treatment on date: 06/13/21 NuStep: Level 3 arms/legs x 6 min-PT present to monitor pt for pain Standing hip abduction and extension 3# added 2x10 bil- verbal cues for alignment Standing rockerboard: x2 min  6" step down 2x10 Single leg stance 3x10 seconds Rt and Lt Hamstring stretch seated: 3x20 seconds  Sit to stand with blue band around thighs x15   PATIENT EDUCATION:  Education details: Access Code: [email or web address] Person educated: Patient Education method: Consulting civil engineer, Media planner, and Handouts Education comprehension: verbalized understanding   HOME EXERCISE PROGRAM: Access Code: [email or web address] URL: [email or web address] Date: 06/04/2021 Prepared by: Claiborne Billings  Exercises - Clamshell  - 1 x daily - 7 x weekly - 1 sets - 10 reps - Sidelying Hip Abduction  - 1 x daily - 7 x weekly - 1 sets - 10 reps - Hip Flexor Stretch at Edge of Bed  - 1 x daily - 7 x weekly - 1 sets - 3 reps - 20 hold - Plank on Knees  - 1 x daily - 7 x weekly - 1 sets - 10 reps - Gastroc Stretch on Wall  - 3 x daily - 7 x weekly - 1 sets - 3 reps - 10 hold - Standing Single Leg Stance with Counter Support  - 2 x daily - 7 x weekly - 1 sets - 3 reps - 10 hold - Sit to Stand Without Arm Support  - 2 x daily - 7 x weekly - 2 sets -  10 reps - Seated Hamstring Stretch  - 3 x daily - 7 x weekly - 1 sets - 3 reps - 20 hold - Heel Raises with Counter Support  - 2 x daily - 7 x weekly - 2 sets - 10 reps ASSESSMENT:  CLINICAL IMPRESSION: Pt reports that he is doing his exercises at home and has marbles for foot intrinsics.  Pt with reduced control of the knee with step downs and PT provided verbal cues for glute med activation and this was challenged with this.  Pt reports some improved function due to improved endurance overall.  Session focused on strength and  alignment with standing exercises. Pt was challenged by exercise today  PT provided verbal and tactile cues throughout session.  Patient will benefit from skilled PT to address the below impairments and improve overall function.     OBJECTIVE IMPAIRMENTS decreased activity tolerance, difficulty walking, decreased ROM, decreased strength, impaired flexibility, and pain.   ACTIVITY LIMITATIONS shopping, community activity, occupation, and yard work.   PERSONAL FACTORS Past/current experiences and Time since onset of injury/illness/exacerbation are also affecting patient's functional outcome.    REHAB POTENTIAL: Good  CLINICAL DECISION MAKING: Stable/uncomplicated  EVALUATION COMPLEXITY: Low   GOALS: Goals reviewed with patient? Yes  SHORT TERM GOALS: Target date: 06/28/21   The patient will demonstrate knowledge of basic self care strategies and exercises to promote healing   Baseline: Goal status: MET  2.  The patient will have improved knee and hip strength to at least 4/5 needed for standing, walking longer distances and descending stairs at home and in the community  Baseline:  Goal status: INITIAL  3.  The patient will report a 40% improvement in pain levels with functional activities which are currently difficult including mobility in the mornings Baseline: able to do most activity, 10-20% reduced pain (06/18/21) Goal status: In progress  4.  The  patient will have improved shoulder elevation ROM to at least 160 degrees needed for grooming/dressing purposes as well as reaching high shelves  Baseline: 145 degrees (06/18/21) Goal status: in progress   LONG TERM GOALS: Target date: 07/26/21   The patient will be independent in a safe self progression of a home exercise program to promote further recovery of function   Baseline:  Goal status: In progress (06/25/21)  2.  The patient will have lumbo pelvic hip strength to grossly 4+/5 needed for heavier duty yard work and running Baseline:  Goal status: INITIAL  3.  The patient will have improved LE strength and proprioception needed to single leg stand 8-10 sec  Baseline:  Goal status: INITIAL  4.  The patient will have improved LE strength to descend steps without medial knee collapse and trunk lean  Baseline:  Goal status: INITIAL  5.  The patient will have improved FOTO score to 74% indicating improved function with less pain  Baseline:  Goal status: INITIAL   PLAN: PT FREQUENCY: 2x/week  PT DURATION: 8 weeks  PLANNED INTERVENTIONS: Therapeutic exercises, Therapeutic activity, Neuromuscular re-education, Balance training, Gait training, Patient/Family education, Joint mobilization, Aquatic Therapy, Dry Needling, Taping, Ultrasound, Ionotophoresis 4mg/ml Dexamethasone, and Manual therapy                                           PLAN FOR NEXT SESSION: Hip, knee, ankle strength and flexibility, foot intrinsic strengthening.  Kelly Takacs, PT 06/25/21 8:02 AM   [facility] [address], [address] [place], [place] [address] Phone # [phone] Fax [phone]   

## 2021-06-27 ENCOUNTER — Ambulatory Visit: Payer: BC Managed Care – PPO

## 2021-06-27 DIAGNOSIS — M25572 Pain in left ankle and joints of left foot: Secondary | ICD-10-CM | POA: Diagnosis not present

## 2021-06-27 DIAGNOSIS — G8929 Other chronic pain: Secondary | ICD-10-CM

## 2021-06-27 DIAGNOSIS — M25561 Pain in right knee: Secondary | ICD-10-CM | POA: Diagnosis not present

## 2021-06-27 DIAGNOSIS — M25571 Pain in right ankle and joints of right foot: Secondary | ICD-10-CM | POA: Diagnosis not present

## 2021-06-27 DIAGNOSIS — M25562 Pain in left knee: Secondary | ICD-10-CM | POA: Diagnosis not present

## 2021-06-27 DIAGNOSIS — R293 Abnormal posture: Secondary | ICD-10-CM | POA: Diagnosis not present

## 2021-06-27 DIAGNOSIS — M25612 Stiffness of left shoulder, not elsewhere classified: Secondary | ICD-10-CM | POA: Diagnosis not present

## 2021-06-27 DIAGNOSIS — M25512 Pain in left shoulder: Secondary | ICD-10-CM | POA: Diagnosis not present

## 2021-06-27 NOTE — Therapy (Signed)
OUTPATIENT PHYSICAL THERAPY TREATMENT   Patient Name: Brent Gordon MRN: [id number] DOB:Jun 02, 1960, 61 y.o., male Today's Date: 06/27/2021   PT End of Session - 06/27/21 0848     Visit Number 7    Date for PT Re-Evaluation 07/26/21    Authorization Type BCBS    PT Start Time 0803    PT Stop Time 0841    PT Time Calculation (min) 38 min    Activity Tolerance Patient tolerated treatment well    Behavior During Therapy [facility] for tasks assessed/performed                   Past Medical History:  Diagnosis Date   Allergy    sees Dr. Lowella Grip   Obesity    Pain, low back    Sleep apnea    PSG 12/01/06 see's Dr Chesley Mires   Varicose veins    Past Surgical History:  Procedure Laterality Date   COLONOSCOPY  08/01/2019   Dr. Wilford Corner, diverticulosis, internal hemorrhoids, no polyps, repeat in 5 yrs   FOOT SURGERY Left 09/26/2011   triple arthrodesis per Dr. Wylene Simmer   HERNIA REPAIR  2005/2006   Dr. Zella Richer   leg vein ablation     right lower, Dr. Linus Mako   TONSILLECTOMY  01/13/1965   Patient Active Problem List   Diagnosis Date Noted   Concussion 10/16/2020   Knee pain 05/16/2016   OBSTRUCTIVE SLEEP APNEA 10/16/2006   Acute low back pain with radicular symptoms, duration less than 6 weeks 09/24/2006    PCP: Dr. Alysia Penna  REFERRING PROVIDER: Dr. Hulan Saas  REFERRING DIAG: H54.562, G89.29 chronic left shoulder pain;  M25.561, M25.562, G89.29 Chronic pain of both knees; M25.571 chronic pain of both ankles  THERAPY DIAG: bil knee pain; bil ankle pain; left shoulder pain; weakness   Rationale for Evaluation and Treatment Rehabilitation  ONSET DATE: 01/13/21  SUBJECTIVE:   SUBJECTIVE STATEMENT: I'm doing well.  I'm doing my exercises.    PERTINENT HISTORY: Acquired flat foot syndrome Left foot surgery 7 screws triple arthrodesis;  left knee meniscus repair 2 years ago Wears off the shelf orthotics  PAIN:  Are you having  pain? Yes NPRS scale: 2-3/10 Pain location: right ankle pain  Aggravating factors: AM, walk on soft sand Relieving factors: after get warmed up  PRECAUTIONS: None  WEIGHT BEARING RESTRICTIONS No  FALLS:  Has patient fallen in last 6 months? No  LIVING ENVIRONMENT: Lives with: lives with their spouse Lives in: House/apartment Stairs:  OCCUPATION: sitting mostly  PLOF: Independent  PATIENT GOALS strengthen legs and core   OBJECTIVE: 05/31/21  DIAGNOSTIC FINDINGS: not recently; presurgery  PATIENT SURVEYS:  FOTO 71%   COGNITION:  Overall cognitive status: Within functional limits for tasks assessed     MUSCLE LENGTH: Hamstrings: Right 65 deg; Left 50 deg Thomas test: Right 0 deg; Left 5 deg Decreased bil gastroc lengths  POSTURE:   Right > left pes planus (severe on right)   LOWER EXTREMITY ROM:  Left ankle dorsiflexion 5 degrees; right 10 degrees;  no left ankle/foot inversion and eversion secondary to surgical hardware  LOWER EXTREMITY MMT:  MMT Right eval Left eval  Hip flexion 4+5 4+  Hip extension 4 4  Hip abduction 3+ 4-  Hip adduction    Hip internal rotation    Hip external rotation    Knee flexion    Knee extension 4- 4  Ankle dorsiflexion 4 4  Ankle plantarflexion 4  4  Ankle inversion    Ankle eversion     (Blank rows = not tested)  Abdominal strength 4-/5  Pelvic drop with quadruped and 6 inch step down test   FUNCTIONAL TESTS:  SLS left 3 sec, right 2 sec;  able to rise from chair without UE support  Able to go up and down steps reciprocally with light railing support  GAIT: Comments: dec stride length  Left shoulder ROM: (seated) flexion 146, abduction 108, external rotation 40 degrees, internal rotation behind back to L4 Left shoulder MMT: flexion 4/5 with pain, abduction 4/5 with pain; external and internal rotation 4+/5 06/18/21: Lt shoulder flexion 145   TODAY'S TREATMENT: Treatment on date: 06/25/21 NuStep: Level 3 arms/legs  x 6 min-PT present to discuss progress Standing hip abduction and extension 3# added 2x10 bil- verbal cues for alignment Shoulder flexion 3# 2x10 Standing rockerboard: x2 min  6" step down 2x10 Hamstring stretch seated: 3x20 seconds  Wall push-ups 2x10 Heel raises: 2x10  Treatment on date: 06/18/21 NuStep: Level 3 arms/legs x 6 min-PT present to monitor pt for pain Standing hip abduction and extension 3# added 2x10 bil- verbal cues for alignment Shoulder flexion 3# 2x10 Standing rockerboard: x2 min  6" step down 2x10 Single leg stance 3x10 seconds Rt and Lt Hamstring stretch seated: 3x20 seconds  Wall push-ups 2x10 Heel raises: 2x10  Treatment on date: 06/13/21 NuStep: Level 3 arms/legs x 6 min-PT present to monitor pt for pain Standing hip abduction and extension 3# added 2x10 bil- verbal cues for alignment Standing rockerboard: x2 min  6" step down 2x10 Single leg stance 3x10 seconds Rt and Lt Hamstring stretch seated: 3x20 seconds  Sit to stand with blue band around thighs x15   PATIENT EDUCATION:  Education details: Access Code: [email or web address] Person educated: Patient Education method: Consulting civil engineer, Media planner, and Handouts Education comprehension: verbalized understanding   HOME EXERCISE PROGRAM: Access Code: [email or web address] URL: [email or web address] Date: 06/04/2021 Prepared by: Claiborne Billings  Exercises - Clamshell  - 1 x daily - 7 x weekly - 1 sets - 10 reps - Sidelying Hip Abduction  - 1 x daily - 7 x weekly - 1 sets - 10 reps - Hip Flexor Stretch at Edge of Bed  - 1 x daily - 7 x weekly - 1 sets - 3 reps - 20 hold - Plank on Knees  - 1 x daily - 7 x weekly - 1 sets - 10 reps - Gastroc Stretch on Wall  - 3 x daily - 7 x weekly - 1 sets - 3 reps - 10 hold - Standing Single Leg Stance with Counter Support  - 2 x daily - 7 x weekly - 1 sets - 3 reps - 10 hold - Sit to Stand Without Arm Support  - 2 x daily - 7 x weekly - 2 sets - 10 reps - Seated Hamstring Stretch  - 3  x daily - 7 x weekly - 1 sets - 3 reps - 20 hold - Heel Raises with Counter Support  - 2 x daily - 7 x weekly - 2 sets - 10 reps ASSESSMENT:  CLINICAL IMPRESSION: Pt reports that he is doing his exercises at home and has marbles for foot intrinsics.  Pt with reduced control of the Rt knee vs the Lt with step downs and pt is using glute med to correct.  Pt reports some improved function due to improved endurance overall.  Session focused on strength and alignment with standing exercises. Pt remains  challenged by current level of exercise. PT provided verbal and tactile cues throughout session.  Patient will benefit from skilled PT to address the below impairments and improve overall function.     OBJECTIVE IMPAIRMENTS decreased activity tolerance, difficulty walking, decreased ROM, decreased strength, impaired flexibility, and pain.   ACTIVITY LIMITATIONS shopping, community activity, occupation, and yard work.   PERSONAL FACTORS Past/current experiences and Time since onset of injury/illness/exacerbation are also affecting patient's functional outcome.    REHAB POTENTIAL: Good  CLINICAL DECISION MAKING: Stable/uncomplicated  EVALUATION COMPLEXITY: Low   GOALS: Goals reviewed with patient? Yes  SHORT TERM GOALS: Target date: 06/28/21   The patient will demonstrate knowledge of basic self care strategies and exercises to promote healing   Baseline: Goal status: MET  2.  The patient will have improved knee and hip strength to at least 4/5 needed for standing, walking longer distances and descending stairs at home and in the community  Baseline:  Goal status: INITIAL  3.  The patient will report a 40% improvement in pain levels with functional activities which are currently difficult including mobility in the mornings Baseline: able to do most activity, 10-20% reduced pain (06/18/21) Goal status: In progress  4.  The patient will have improved shoulder elevation ROM to at least 160  degrees needed for grooming/dressing purposes as well as reaching high shelves  Baseline: 145 degrees (06/18/21) Goal status: in progress   LONG TERM GOALS: Target date: 07/26/21   The patient will be independent in a safe self progression of a home exercise program to promote further recovery of function   Baseline:  Goal status: In progress (06/25/21)  2.  The patient will have lumbo pelvic hip strength to grossly 4+/5 needed for heavier duty yard work and running Baseline:  Goal status: INITIAL  3.  The patient will have improved LE strength and proprioception needed to single leg stand 8-10 sec  Baseline:  Goal status: INITIAL  4.  The patient will have improved LE strength to descend steps without medial knee collapse and trunk lean  Baseline:  Goal status: INITIAL  5.  The patient will have improved FOTO score to 74% indicating improved function with less pain  Baseline:  Goal status: INITIAL   PLAN: PT FREQUENCY: 2x/week  PT DURATION: 8 weeks  PLANNED INTERVENTIONS: Therapeutic exercises, Therapeutic activity, Neuromuscular re-education, Balance training, Gait training, Patient/Family education, Joint mobilization, Aquatic Therapy, Dry Needling, Taping, Ultrasound, Ionotophoresis 40m/ml Dexamethasone, and Manual therapy                                           PLAN FOR NEXT SESSION: Hip, knee, ankle strength and flexibility, foot intrinsic strengthening.  KSigurd Sos PT 06/27/21 8:50 AM   B[facility] CenterSpecial[facility]hab [facility] [address] SHolly Lake RanchGCochranville [place] [address]Phone # [phone] [phone]

## 2021-07-01 DIAGNOSIS — G4733 Obstructive sleep apnea (adult) (pediatric): Secondary | ICD-10-CM | POA: Diagnosis not present

## 2021-07-02 ENCOUNTER — Ambulatory Visit: Payer: BC Managed Care – PPO

## 2021-07-02 DIAGNOSIS — R293 Abnormal posture: Secondary | ICD-10-CM | POA: Diagnosis not present

## 2021-07-02 DIAGNOSIS — M25572 Pain in left ankle and joints of left foot: Secondary | ICD-10-CM | POA: Diagnosis not present

## 2021-07-02 DIAGNOSIS — M25571 Pain in right ankle and joints of right foot: Secondary | ICD-10-CM | POA: Diagnosis not present

## 2021-07-02 DIAGNOSIS — M25562 Pain in left knee: Secondary | ICD-10-CM | POA: Diagnosis not present

## 2021-07-02 DIAGNOSIS — M25612 Stiffness of left shoulder, not elsewhere classified: Secondary | ICD-10-CM

## 2021-07-02 DIAGNOSIS — M25561 Pain in right knee: Secondary | ICD-10-CM | POA: Diagnosis not present

## 2021-07-02 DIAGNOSIS — M25512 Pain in left shoulder: Secondary | ICD-10-CM | POA: Diagnosis not present

## 2021-07-02 DIAGNOSIS — G8929 Other chronic pain: Secondary | ICD-10-CM | POA: Diagnosis not present

## 2021-07-02 NOTE — Therapy (Signed)
OUTPATIENT PHYSICAL THERAPY TREATMENT   Patient Name: Brent Gordon MRN: [id number] DOB:23-Jul-1960, 61 y.o., male Today's Date: 07/02/2021   PT End of Session - 07/02/21 0802     Visit Number 8    Date for PT Re-Evaluation 07/26/21    Authorization Type [employer]BS    PT Start Time 0728    PT Stop Time 0802    PT Time Calculation (min) 34 min    Activity Tolerance Patient tolerated treatment well    Behavior During Therapy [facility] for tasks assessed/performed                    Past Medical History:  Diagnosis Date   Allergy    sees Dr. Lowella Grip   Obesity    Pain, low back    Sleep apnea    PSG 12/01/06 see's Dr Chesley Mires   Varicose veins    Past Surgical History:  Procedure Laterality Date   COLONOSCOPY  08/01/2019   Dr. Wilford Corner, diverticulosis, internal hemorrhoids, no polyps, repeat in 5 yrs   FOOT SURGERY Left 09/26/2011   triple arthrodesis per Dr. Wylene Simmer   HERNIA REPAIR  2005/2006   Dr. Zella Richer   leg vein ablation     right lower, Dr. Linus Mako   TONSILLECTOMY  01/13/1965   Patient Active Problem List   Diagnosis Date Noted   Concussion 10/16/2020   Knee pain 05/16/2016   OBSTRUCTIVE SLEEP APNEA 10/16/2006   Acute low back pain with radicular symptoms, duration less than 6 weeks 09/24/2006    PCP: Dr. Alysia Penna  REFERRING PROVIDER: Dr. Hulan Saas  REFERRING DIAG: E93.810, G89.29 chronic left shoulder pain;  M25.561, M25.562, G89.29 Chronic pain of both knees; M25.571 chronic pain of both ankles  THERAPY DIAG: bil knee pain; bil ankle pain; left shoulder pain; weakness   Rationale for Evaluation and Treatment Rehabilitation  ONSET DATE: 01/13/21  SUBJECTIVE:   SUBJECTIVE STATEMENT: I was a little bit sore in the night last night.  I did my exercises late last night.   PERTINENT HISTORY: Acquired flat foot syndrome Left foot surgery 7 screws triple arthrodesis;  left knee meniscus repair 2 years ago Wears off  the shelf orthotics  PAIN:  Are you having pain? Yes NPRS scale: 3/10 Pain location: Legs and Lt shoulder   Aggravating factors: AM, walk on soft sand Relieving factors: after get warmed up  PRECAUTIONS: None  WEIGHT BEARING RESTRICTIONS No  FALLS:  Has patient fallen in last 6 months? No  LIVING ENVIRONMENT: Lives with: lives with their spouse Lives in: House/apartment Stairs:  OCCUPATION: sitting mostly  PATIENT GOALS strengthen legs and core   OBJECTIVE: 05/31/21  DIAGNOSTIC FINDINGS: not recently; presurgery  PATIENT SURVEYS:  FOTO 71%   COGNITION:  Overall cognitive status: Within functional limits for tasks assessed     MUSCLE LENGTH: Hamstrings: Right 65 deg; Left 50 deg Thomas test: Right 0 deg; Left 5 deg Decreased bil gastroc lengths  POSTURE:   Right > left pes planus (severe on right)   LOWER EXTREMITY ROM:  Left ankle dorsiflexion 5 degrees; right 10 degrees;  no left ankle/foot inversion and eversion secondary to surgical hardware  LOWER EXTREMITY MMT:  MMT Right eval Rt  07/02/21 Left eval Right  07/02/21  Hip flexion 4+5 4+/5 4+ 4+/5  Hip extension 4  4   Hip abduction 3+  4-   Hip adduction      Hip internal rotation  Hip external rotation      Knee flexion  4/5  4/5  Knee extension 4- 4/5 4 4/5  Ankle dorsiflexion 4  4   Ankle plantarflexion 4  4   Ankle inversion      Ankle eversion       (Blank rows = not tested)  Abdominal strength 4-/5  Pelvic drop with quadruped and 6 inch step down test   FUNCTIONAL TESTS:  SLS left 3 sec, right 2 sec;  able to rise from chair without UE support  Able to go up and down steps reciprocally with light railing support  GAIT: Comments: dec stride length  Left shoulder ROM: (seated) flexion 146, abduction 108, external rotation 40 degrees, internal rotation behind back to L4 Left shoulder MMT: flexion 4/5 with pain, abduction 4/5 with pain; external and internal rotation 4+/5 06/18/21:  Lt shoulder flexion 145   TODAY'S TREATMENT: Treatment on date: 07/02/21 NuStep: Level 3 arms/legs x 6 min-PT present to discuss progress Standing hip abduction and extension 3# added, standing on balance pad-2x10 bil- verbal cues for alignment Shoulder flexion 3# 2x10 Standing rockerboard: x2 min  Obstacle course: walking over hurdles and unlevel balance tiles alternating with step-over-step Alternating 6" step-taps 2x10 bil each Hamstring stretch seated: 3x20 seconds  Wall push-ups 2x10 Heel raises: 2x10  Treatment on date: 06/25/21 NuStep: Level 3 arms/legs x 6 min-PT present to discuss progress Standing hip abduction and extension 3# added 2x10 bil- verbal cues for alignment Shoulder flexion 3# 2x10 Standing rockerboard: x2 min  6" step down 2x10 Hamstring stretch seated: 3x20 seconds  Wall push-ups 2x10 Heel raises: 2x10  Treatment on date: 06/18/21 NuStep: Level 3 arms/legs x 6 min-PT present to monitor pt for pain Standing hip abduction and extension 3# added 2x10 bil- verbal cues for alignment Shoulder flexion 3# 2x10 Standing rockerboard: x2 min  6" step down 2x10 Single leg stance 3x10 seconds Rt and Lt Hamstring stretch seated: 3x20 seconds  Wall push-ups 2x10 Heel raises: 2x10   PATIENT EDUCATION:  Education details: Access Code: [email or web address] Person educated: Patient Education method: Consulting civil engineer, Media planner, and Handouts Education comprehension: verbalized understanding   HOME EXERCISE PROGRAM: Access Code: [email or web address] URL: [email or web address] Date: 06/04/2021 Prepared by: Claiborne Billings  Exercises - Clamshell  - 1 x daily - 7 x weekly - 1 sets - 10 reps - Sidelying Hip Abduction  - 1 x daily - 7 x weekly - 1 sets - 10 reps - Hip Flexor Stretch at Edge of Bed  - 1 x daily - 7 x weekly - 1 sets - 3 reps - 20 hold - Plank on Knees  - 1 x daily - 7 x weekly - 1 sets - 10 reps - Gastroc Stretch on Wall  - 3 x daily - 7 x weekly - 1 sets - 3 reps - 10  hold - Standing Single Leg Stance with Counter Support  - 2 x daily - 7 x weekly - 1 sets - 3 reps - 10 hold - Sit to Stand Without Arm Support  - 2 x daily - 7 x weekly - 2 sets - 10 reps - Seated Hamstring Stretch  - 3 x daily - 7 x weekly - 1 sets - 3 reps - 20 hold - Heel Raises with Counter Support  - 2 x daily - 7 x weekly - 2 sets - 10 reps ASSESSMENT:  CLINICAL IMPRESSION: Pt was able to get some rest over the weekend and reports overall reduced pain levels  in arms and legs due to this.  Pt with improved pelvic control with single limb activity. Knee and hip strength is improved on the Rt.  Pt requires close supervision with stepping over hurdles and on unlevel balance tiles with loss of balance laterally a couple of times.   Pt remains challenged by current level of exercise. PT provided verbal and tactile cues throughout session.  Patient will benefit from skilled PT to address the below impairments and improve overall function.     OBJECTIVE IMPAIRMENTS decreased activity tolerance, difficulty walking, decreased ROM, decreased strength, impaired flexibility, and pain.   ACTIVITY LIMITATIONS shopping, community activity, occupation, and yard work.   PERSONAL FACTORS Past/current experiences and Time since onset of injury/illness/exacerbation are also affecting patient's functional outcome.    REHAB POTENTIAL: Good  CLINICAL DECISION MAKING: Stable/uncomplicated  EVALUATION COMPLEXITY: Low   GOALS: Goals reviewed with patient? Yes  SHORT TERM GOALS: Target date: 06/28/21   The patient will demonstrate knowledge of basic self care strategies and exercises to promote healing   Baseline: Goal status: MET  2.  The patient will have improved knee and hip strength to at least 4/5 needed for standing, walking longer distances and descending stairs at home and in the community  Baseline:  Goal status: In progress  3.  The patient will report a 40% improvement in pain levels with  functional activities which are currently difficult including mobility in the mornings Baseline: able to do most activity, 20-25% reduced pain (07/02/21) Goal status: In progress  4.  The patient will have improved shoulder elevation ROM to at least 160 degrees needed for grooming/dressing purposes as well as reaching high shelves  Baseline: 145 degrees (06/18/21) Goal status: in progress   LONG TERM GOALS: Target date: 07/26/21   The patient will be independent in a safe self progression of a home exercise program to promote further recovery of function   Baseline:  Goal status: In progress (06/25/21)  2.  The patient will have lumbo pelvic hip strength to grossly 4+/5 needed for heavier duty yard work and running Baseline:  Goal status: INITIAL  3.  The patient will have improved LE strength and proprioception needed to single leg stand 8-10 sec  Baseline:  Goal status: INITIAL  4.  The patient will have improved LE strength to descend steps without medial knee collapse and trunk lean  Baseline:  Goal status: INITIAL  5.  The patient will have improved FOTO score to 74% indicating improved function with less pain  Baseline:  Goal status: INITIAL   PLAN: PT FREQUENCY: 2x/week  PT DURATION: 8 weeks  PLANNED INTERVENTIONS: Therapeutic exercises, Therapeutic activity, Neuromuscular re-education, Balance training, Gait training, Patient/Family education, Joint mobilization, Aquatic Therapy, Dry Needling, Taping, Ultrasound, Ionotophoresis 6m/ml Dexamethasone, and Manual therapy                                           PLAN FOR NEXT SESSION: Hip, knee, ankle strength and flexibility, foot intrinsic strengthening.  Work on [employer]  KSigurd Sos PT 07/02/21 8:03 AM   [address] SV[place]burgG[place]hal[place] [address]Phone # [phone] [phone]

## 2021-07-04 ENCOUNTER — Ambulatory Visit: Payer: BC Managed Care – PPO

## 2021-07-04 DIAGNOSIS — M25572 Pain in left ankle and joints of left foot: Secondary | ICD-10-CM | POA: Diagnosis not present

## 2021-07-04 DIAGNOSIS — M25561 Pain in right knee: Secondary | ICD-10-CM | POA: Diagnosis not present

## 2021-07-04 DIAGNOSIS — M25571 Pain in right ankle and joints of right foot: Secondary | ICD-10-CM

## 2021-07-04 DIAGNOSIS — M25612 Stiffness of left shoulder, not elsewhere classified: Secondary | ICD-10-CM

## 2021-07-04 DIAGNOSIS — G8929 Other chronic pain: Secondary | ICD-10-CM | POA: Diagnosis not present

## 2021-07-04 DIAGNOSIS — R293 Abnormal posture: Secondary | ICD-10-CM | POA: Diagnosis not present

## 2021-07-04 DIAGNOSIS — M25562 Pain in left knee: Secondary | ICD-10-CM | POA: Diagnosis not present

## 2021-07-04 DIAGNOSIS — M25512 Pain in left shoulder: Secondary | ICD-10-CM | POA: Diagnosis not present

## 2021-07-04 NOTE — Therapy (Signed)
OUTPATIENT PHYSICAL THERAPY TREATMENT   Patient Name: Brent Gordon MRN: [id number] DOB:1961/01/04, 61 y.o., male Today's Date: 07/04/2021   PT End of Session - 07/04/21 0802     Visit Number 9    Date for PT Re-Evaluation 07/26/21    Authorization Type BCBS    PT Start Time 0730    PT Stop Time 0803    PT Time Calculation (min) 33 min    Activity Tolerance Patient tolerated treatment well    Behavior During Therapy [facility] for tasks assessed/performed                     Past Medical History:  Diagnosis Date   Allergy    sees Dr. Lowella Grip   Obesity    Pain, low back    Sleep apnea    PSG 12/01/06 see's Dr Chesley Mires   Varicose veins    Past Surgical History:  Procedure Laterality Date   COLONOSCOPY  08/01/2019   Dr. Wilford Corner, diverticulosis, internal hemorrhoids, no polyps, repeat in 5 yrs   FOOT SURGERY Left 09/26/2011   triple arthrodesis per Dr. Wylene Simmer   HERNIA REPAIR  2005/2006   Dr. Zella Richer   leg vein ablation     right lower, Dr. Linus Mako   TONSILLECTOMY  01/13/1965   Patient Active Problem List   Diagnosis Date Noted   Concussion 10/16/2020   Knee pain 05/16/2016   OBSTRUCTIVE SLEEP APNEA 10/16/2006   Acute low back pain with radicular symptoms, duration less than 6 weeks 09/24/2006    PCP: Dr. Alysia Penna  REFERRING PROVIDER: Dr. Hulan Saas  REFERRING DIAG: H41.740, G89.29 chronic left shoulder pain;  M25.561, M25.562, G89.29 Chronic pain of both knees; M25.571 chronic pain of both ankles  THERAPY DIAG: bil knee pain; bil ankle pain; left shoulder pain; weakness   Rationale for Evaluation and Treatment Rehabilitation  ONSET DATE: 01/13/21  SUBJECTIVE:   SUBJECTIVE STATEMENT: I'm doing my exercises.   PERTINENT HISTORY: Acquired flat foot syndrome Left foot surgery 7 screws triple arthrodesis;  left knee meniscus repair 2 years ago Wears off the shelf orthotics  PAIN:  Are you having pain?  Yes NPRS scale: 2-3/10 Pain location: Legs and Lt shoulder   Aggravating factors: AM, walk on soft sand Relieving factors: after get warmed up  PRECAUTIONS: None  WEIGHT BEARING RESTRICTIONS No  FALLS:  Has patient fallen in last 6 months? No  LIVING ENVIRONMENT: Lives with: lives with their spouse Lives in: House/apartment Stairs:  OCCUPATION: sitting mostly  PATIENT GOALS strengthen legs and core   OBJECTIVE: 05/31/21  DIAGNOSTIC FINDINGS: not recently; presurgery  PATIENT SURVEYS:  FOTO 71%   COGNITION:  Overall cognitive status: Within functional limits for tasks assessed     MUSCLE LENGTH: Hamstrings: Right 65 deg; Left 50 deg Thomas test: Right 0 deg; Left 5 deg Decreased bil gastroc lengths  POSTURE:   Right > left pes planus (severe on right)   LOWER EXTREMITY ROM:  Left ankle dorsiflexion 5 degrees; right 10 degrees;  no left ankle/foot inversion and eversion secondary to surgical hardware  LOWER EXTREMITY MMT:  MMT Right eval Rt  07/02/21 Left eval Right  07/02/21  Hip flexion 4+5 4+/5 4+ 4+/5  Hip extension 4  4   Hip abduction 3+  4-   Hip adduction      Hip internal rotation      Hip external rotation      Knee flexion  4/5  4/5  Knee extension 4- 4/5 4 4/5  Ankle dorsiflexion 4  4   Ankle plantarflexion 4  4   Ankle inversion      Ankle eversion       (Blank rows = not tested)  Abdominal strength 4-/5  Pelvic drop with quadruped and 6 inch step down test   FUNCTIONAL TESTS:  SLS left 3 sec, right 2 sec;  able to rise from chair without UE support  Able to go up and down steps reciprocally with light railing support  GAIT: Comments: dec stride length  Left shoulder ROM: (seated) flexion 146, abduction 108, external rotation 40 degrees, internal rotation behind back to L4 Left shoulder MMT: flexion 4/5 with pain, abduction 4/5 with pain; external and internal rotation 4+/5 06/18/21: Lt shoulder flexion 145   TODAY'S  TREATMENT: Treatment on date: 07/04/21 NuStep: Level 3 arms/legs x 6 min-PT present to discuss progress Standing hip abduction and extension 3# added, standing on balance pad-2x10 bil- verbal cues for alignment Shoulder flexion 3# 2x10 Standing rockerboard: x2 min  Obstacle course: walking over hurdles and unlevel balance tiles alternating with step-over-step Alternating step-taps x20 bil each Heel raises: on mini tramp x20 Weight shifting: 3 ways x1 min each Wall push-ups 2x10 Sit to stand: 5# kettlebell x20  Treatment on date: 07/02/21 NuStep: Level 3 arms/legs x 6 min-PT present to discuss progress Standing hip abduction and extension 3# added, standing on balance pad-2x10 bil- verbal cues for alignment Shoulder flexion 3# 2x10 Standing rockerboard: x2 min  Obstacle course: walking over hurdles and unlevel balance tiles alternating with step-over-step Alternating 6" step-taps 2x10 bil each Hamstring stretch seated: 3x20 seconds  Wall push-ups 2x10 Heel raises: 2x10  Treatment on date: 06/25/21 NuStep: Level 3 arms/legs x 6 min-PT present to discuss progress Standing hip abduction and extension 3# added 2x10 bil- verbal cues for alignment Shoulder flexion 3# 2x10 Standing rockerboard: x2 min  6" step down 2x10 Hamstring stretch seated: 3x20 seconds  Wall push-ups 2x10 Heel raises: 2x10   PATIENT EDUCATION:  Education details: Access Code: [email or web address] Person educated: Patient Education method: Consulting civil engineer, Media planner, and Handouts Education comprehension: verbalized understanding   HOME EXERCISE PROGRAM: Access Code: [email or web address] URL: [email or web address] Date: 06/04/2021 Prepared by: Claiborne Billings  Exercises - Clamshell  - 1 x daily - 7 x weekly - 1 sets - 10 reps - Sidelying Hip Abduction  - 1 x daily - 7 x weekly - 1 sets - 10 reps - Hip Flexor Stretch at Edge of Bed  - 1 x daily - 7 x weekly - 1 sets - 3 reps - 20 hold - Plank on Knees  - 1 x daily - 7 x  weekly - 1 sets - 10 reps - Gastroc Stretch on Wall  - 3 x daily - 7 x weekly - 1 sets - 3 reps - 10 hold - Standing Single Leg Stance with Counter Support  - 2 x daily - 7 x weekly - 1 sets - 3 reps - 10 hold - Sit to Stand Without Arm Support  - 2 x daily - 7 x weekly - 2 sets - 10 reps - Seated Hamstring Stretch  - 3 x daily - 7 x weekly - 1 sets - 3 reps - 20 hold - Heel Raises with Counter Support  - 2 x daily - 7 x weekly - 2 sets - 10 reps ASSESSMENT:  CLINICAL IMPRESSION: Pt with improved stability with balance tasks today and pt with improved pelvic  control with single limb activity. Knee and hip strength is improved on the Rt this week.  Pt requires close supervision with stepping over hurdles and on unlevel balance tiles with without loss of balance today.   Pt remains challenged by current level of exercise. PT provided verbal and tactile cues throughout session.  Patient will benefit from skilled PT to address the below impairments and improve overall function.     OBJECTIVE IMPAIRMENTS decreased activity tolerance, difficulty walking, decreased ROM, decreased strength, impaired flexibility, and pain.   ACTIVITY LIMITATIONS shopping, community activity, occupation, and yard work.   PERSONAL FACTORS Past/current experiences and Time since onset of injury/illness/exacerbation are also affecting patient's functional outcome.    REHAB POTENTIAL: Good  CLINICAL DECISION MAKING: Stable/uncomplicated  EVALUATION COMPLEXITY: Low   GOALS: Goals reviewed with patient? Yes  SHORT TERM GOALS: Target date: 06/28/21   The patient will demonstrate knowledge of basic self care strategies and exercises to promote healing   Baseline: Goal status: MET  2.  The patient will have improved knee and hip strength to at least 4/5 needed for standing, walking longer distances and descending stairs at home and in the community  Baseline:  Goal status: In progress  3.  The patient will report  a 40% improvement in pain levels with functional activities which are currently difficult including mobility in the mornings Baseline: able to do most activity, 20-25% reduced pain (07/02/21) Goal status: In progress  4.  The patient will have improved shoulder elevation ROM to at least 160 degrees needed for grooming/dressing purposes as well as reaching high shelves  Baseline: 145 degrees (06/18/21) Goal status: in progress   LONG TERM GOALS: Target date: 07/26/21   The patient will be independent in a safe self progression of a home exercise program to promote further recovery of function   Baseline:  Goal status: In progress (06/25/21)  2.  The patient will have lumbo pelvic hip strength to grossly 4+/5 needed for heavier duty yard work and running Baseline:  Goal status: INITIAL  3.  The patient will have improved LE strength and proprioception needed to single leg stand 8-10 sec  Baseline:  Goal status: INITIAL  4.  The patient will have improved LE strength to descend steps without medial knee collapse and trunk lean  Baseline:improved stability Goal status: INITIAL  5.  The patient will have improved FOTO score to 74% indicating improved function with less pain  Baseline:  Goal status: INITIAL   PLAN: PT FREQUENCY: 2x/week  PT DURATION: 8 weeks  PLANNED INTERVENTIONS: Therapeutic exercises, Therapeutic activity, Neuromuscular re-education, Balance training, Gait training, Patient/Family education, Joint mobilization, Aquatic Therapy, Dry Needling, Taping, Ultrasound, Ionotophoresis 57m/ml Dexamethasone, and Manual therapy                                           PLAN FOR NEXT SESSION: Hip, knee, ankle strength and flexibility, foot intrinsic strengthening.  Work on [employer]  [employer] PT 07/04/21 8:03 AM   [place] [place] [address]Phone # [phone] [phone]

## 2021-07-09 ENCOUNTER — Ambulatory Visit: Payer: BC Managed Care – PPO

## 2021-07-09 DIAGNOSIS — M25562 Pain in left knee: Secondary | ICD-10-CM | POA: Diagnosis not present

## 2021-07-09 DIAGNOSIS — G8929 Other chronic pain: Secondary | ICD-10-CM

## 2021-07-09 DIAGNOSIS — M25612 Stiffness of left shoulder, not elsewhere classified: Secondary | ICD-10-CM | POA: Diagnosis not present

## 2021-07-09 DIAGNOSIS — M25572 Pain in left ankle and joints of left foot: Secondary | ICD-10-CM

## 2021-07-09 DIAGNOSIS — M25561 Pain in right knee: Secondary | ICD-10-CM | POA: Diagnosis not present

## 2021-07-09 DIAGNOSIS — R293 Abnormal posture: Secondary | ICD-10-CM | POA: Diagnosis not present

## 2021-07-09 DIAGNOSIS — M25571 Pain in right ankle and joints of right foot: Secondary | ICD-10-CM

## 2021-07-09 DIAGNOSIS — M25512 Pain in left shoulder: Secondary | ICD-10-CM | POA: Diagnosis not present

## 2021-07-09 NOTE — Therapy (Signed)
OUTPATIENT PHYSICAL THERAPY TREATMENT   Patient Name: Brent Gordon MRN: [id number] DOB:September 17, 1960, 61 y.o., male Today's Date: 07/09/2021   PT End of Session - 07/09/21 0734     Visit Number 10    Date for PT Re-Evaluation 07/26/21    Authorization Type BCBS    PT Start Time 0729    PT Stop Time 0803    PT Time Calculation (min) 34 min    Activity Tolerance Patient tolerated treatment well    Behavior During Therapy [facility] for tasks assessed/performed                      Past Medical History:  Diagnosis Date   Allergy    sees Dr. Vergia Alberts   Obesity    Pain, low back    Sleep apnea    PSG 12/01/06 see's Dr Coralyn Helling   Varicose veins    Past Surgical History:  Procedure Laterality Date   COLONOSCOPY  08/01/2019   Dr. Charlott Rakes, diverticulosis, internal hemorrhoids, no polyps, repeat in 5 yrs   FOOT SURGERY Left 09/26/2011   triple arthrodesis per Dr. Toni Arthurs   HERNIA REPAIR  2005/2006   Dr. Abbey Chatters   leg vein ablation     right lower, Dr. Consuela Mimes   TONSILLECTOMY  01/13/1965   Patient Active Problem List   Diagnosis Date Noted   Concussion 10/16/2020   Knee pain 05/16/2016   OBSTRUCTIVE SLEEP APNEA 10/16/2006   Acute low back pain with radicular symptoms, duration less than 6 weeks 09/24/2006    PCP: Dr. Gershon Crane  REFERRING PROVIDER: Dr. Antoine Primas  REFERRING DIAG: N82.956, G89.29 chronic left shoulder pain;  M25.561, M25.562, G89.29 Chronic pain of both knees; M25.571 chronic pain of both ankles  THERAPY DIAG: bil knee pain; bil ankle pain; left shoulder pain; weakness   Rationale for Evaluation and Treatment Rehabilitation  ONSET DATE: 01/13/21  SUBJECTIVE:   SUBJECTIVE STATEMENT: I had more pain over the weekend, maybe due to the weather.  Pain is widespread.    PERTINENT HISTORY: Acquired flat foot syndrome Left foot surgery 7 screws triple arthrodesis;  left knee meniscus repair 2 years ago Wears  off the shelf orthotics  PAIN:  Are you having pain? Yes NPRS scale: 3/10 Pain location: Legs and Lt shoulder   Aggravating factors: AM, walk on soft sand Relieving factors: after get warmed up  PRECAUTIONS: None  WEIGHT BEARING RESTRICTIONS No  FALLS:  Has patient fallen in last 6 months? No  LIVING ENVIRONMENT: Lives with: lives with their spouse Lives in: House/apartment Stairs:  OCCUPATION: sitting mostly  PATIENT GOALS strengthen legs and core   OBJECTIVE: 05/31/21  DIAGNOSTIC FINDINGS: not recently; presurgery  PATIENT SURVEYS:  FOTO 71%  07/09/21: 63    COGNITION:  Overall cognitive status: Within functional limits for tasks assessed     MUSCLE LENGTH: Hamstrings: Right 65 deg; Left 50 deg Thomas test: Right 0 deg; Left 5 deg Decreased bil gastroc lengths  POSTURE:   Right > left pes planus (severe on right)   LOWER EXTREMITY ROM:  Left ankle dorsiflexion 5 degrees; right 10 degrees;  no left ankle/foot inversion and eversion secondary to surgical hardware  LOWER EXTREMITY MMT:  MMT Right eval Rt  07/02/21 Left eval Right  07/02/21  Hip flexion 4+5 4+/5 4+ 4+/5  Hip extension 4  4   Hip abduction 3+  4-   Hip adduction      Hip internal  rotation      Hip external rotation      Knee flexion  4/5  4/5  Knee extension 4- 4/5 4 4/5  Ankle dorsiflexion 4  4   Ankle plantarflexion 4  4   Ankle inversion      Ankle eversion       (Blank rows = not tested)  Abdominal strength 4-/5  Pelvic drop with quadruped and 6 inch step down test   FUNCTIONAL TESTS:  SLS left 3 sec, right 2 sec;  able to rise from chair without UE support  Able to go up and down steps reciprocally with light railing support  GAIT: Comments: dec stride length  Left shoulder ROM: (seated) flexion 146, abduction 108, external rotation 40 degrees, internal rotation behind back to L4 Left shoulder MMT: flexion 4/5 with pain, abduction 4/5 with pain; external and internal  rotation 4+/5 06/18/21: Lt shoulder flexion 145   TODAY'S TREATMENT: Treatment on date: 07/09/21 NuStep: Level 3 arms/legs x 6 min-PT present to discuss progress Standing hip abduction and extension 3# added, standing on balance pad-2x10 bil- verbal cues for alignment Shoulder flexion 3# 2x10 Row: 20# 2x10 Standing rockerboard: x2 min  Obstacle course: walking over hurdles and unlevel balance tiles alternating with step-over-step Alternating step-taps x20 bil each Heel raises: on mini tramp x20 Weight shifting: 3 ways x1 min each Wall push-ups 2x10 Sit to stand: 5# kettlebell x20  Treatment on date: 07/04/21 NuStep: Level 3 arms/legs x 6 min-PT present to discuss progress Standing hip abduction and extension 3# added, standing on balance pad-2x10 bil- verbal cues for alignment Shoulder flexion 3# 2x10 Obstacle course: walking over hurdles and unlevel balance tiles alternating with step-over-step Alternating step-taps x20 bil each Heel raises: on mini tramp x20 Weight shifting: 3 ways x1 min each on minitramp- good pelvic alignment Wall push-ups 2x10 Sit to stand: 5# kettlebell x20  Treatment on date: 07/02/21 NuStep: Level 3 arms/legs x 6 min-PT present to discuss progress Standing hip abduction and extension 3# added, standing on balance pad-2x10 bil- verbal cues for alignment Shoulder flexion 3# 2x10 Standing rockerboard: x2 min  Obstacle course: walking over hurdles and unlevel balance tiles alternating with step-over-step Alternating 6" step-taps 2x10 bil each Hamstring stretch seated: 3x20 seconds  Wall push-ups 2x10 Heel raises: 2x10  PATIENT EDUCATION:  Education details: Access Code: [email or web address] Person educated: Patient Education method: Explanation, Facilities manager, and Handouts Education comprehension: verbalized understanding   HOME EXERCISE PROGRAM: Access Code: [email or web address] URL: [email or web address] Date: 06/04/2021 Prepared by: Tresa Endo  Exercises -  Clamshell  - 1 x daily - 7 x weekly - 1 sets - 10 reps - Sidelying Hip Abduction  - 1 x daily - 7 x weekly - 1 sets - 10 reps - Hip Flexor Stretch at Edge of Bed  - 1 x daily - 7 x weekly - 1 sets - 3 reps - 20 hold - Plank on Knees  - 1 x daily - 7 x weekly - 1 sets - 10 reps - Gastroc Stretch on Wall  - 3 x daily - 7 x weekly - 1 sets - 3 reps - 10 hold - Standing Single Leg Stance with Counter Support  - 2 x daily - 7 x weekly - 1 sets - 3 reps - 10 hold - Sit to Stand Without Arm Support  - 2 x daily - 7 x weekly - 2 sets - 10 reps - Seated Hamstring Stretch  - 3 x daily - 7 x weekly -  1 sets - 3 reps - 20 hold - Heel Raises with Counter Support  - 2 x daily - 7 x weekly - 2 sets - 10 reps ASSESSMENT:  CLINICAL IMPRESSION: Pt reports some increased pain over the weekend due to weather changes.  Pt continues to work on stability of the lower extremities and is able to stabilize with improved alignment today.  Pt requires close supervision with stepping over hurdles and on unlevel balance tiles, no loss of balance today.  PT provided verbal and tactile cues throughout session.  Patient will benefit from skilled PT to address the below impairments and improve overall function.     OBJECTIVE IMPAIRMENTS decreased activity tolerance, difficulty walking, decreased ROM, decreased strength, impaired flexibility, and pain.   ACTIVITY LIMITATIONS shopping, community activity, occupation, and yard work.   PERSONAL FACTORS Past/current experiences and Time since onset of injury/illness/exacerbation are also affecting patient's functional outcome.    REHAB POTENTIAL: Good  CLINICAL DECISION MAKING: Stable/uncomplicated  EVALUATION COMPLEXITY: Low   GOALS: Goals reviewed with patient? Yes  SHORT TERM GOALS: Target date: 06/28/21   The patient will demonstrate knowledge of basic self care strategies and exercises to promote healing   Baseline: Goal status: MET  2.  The patient will have  improved knee and hip strength to at least 4/5 needed for standing, walking longer distances and descending stairs at home and in the community  Baseline:  Goal status: In progress  3.  The patient will report a 40% improvement in pain levels with functional activities which are currently difficult including mobility in the mornings Baseline: able to do most activity, 20-25% reduced pain (07/02/21) Goal status: In progress  4.  The patient will have improved shoulder elevation ROM to at least 160 degrees needed for grooming/dressing purposes as well as reaching high shelves  Baseline: 145 degrees (06/18/21) Goal status: in progress   LONG TERM GOALS: Target date: 07/26/21   The patient will be independent in a safe self progression of a home exercise program to promote further recovery of function   Baseline:  Goal status: In progress (06/25/21)  2.  The patient will have lumbo pelvic hip strength to grossly 4+/5 needed for heavier duty yard work and running Baseline:  Goal status: INITIAL  3.  The patient will have improved LE strength and proprioception needed to single leg stand 8-10 sec  Baseline:  Goal status: INITIAL  4.  The patient will have improved LE strength to descend steps without medial knee collapse and trunk lean  Baseline:improved stability (07/09/21) Goal status: In progress  5.  The patient will have improved FOTO score to 74% indicating improved function with less pain  Baseline: 63 Goal status: In progress    PLAN: PT FREQUENCY: 2x/week  PT DURATION: 8 weeks  PLANNED INTERVENTIONS: Therapeutic exercises, Therapeutic activity, Neuromuscular re-education, Balance training, Gait training, Patient/Family education, Joint mobilization, Aquatic Therapy, Dry Needling, Taping, Ultrasound, Ionotophoresis 4mg /ml Dexamethasone, and Manual therapy                                           PLAN FOR NEXT SESSION: Hip, knee, ankle strength and flexibility, foot intrinsic  strengthening.  Work on [employer] again  [employer], PT 07/09/21 8:06 AM   [facility] [address], [address] [place], [place] [address] Phone # [phone] Fax [phone]

## 2021-07-11 ENCOUNTER — Ambulatory Visit: Payer: BC Managed Care – PPO | Admitting: Physical Therapy

## 2021-07-11 DIAGNOSIS — M25512 Pain in left shoulder: Secondary | ICD-10-CM | POA: Diagnosis not present

## 2021-07-11 DIAGNOSIS — R293 Abnormal posture: Secondary | ICD-10-CM | POA: Diagnosis not present

## 2021-07-11 DIAGNOSIS — G8929 Other chronic pain: Secondary | ICD-10-CM | POA: Diagnosis not present

## 2021-07-11 DIAGNOSIS — M25571 Pain in right ankle and joints of right foot: Secondary | ICD-10-CM

## 2021-07-11 DIAGNOSIS — M25612 Stiffness of left shoulder, not elsewhere classified: Secondary | ICD-10-CM | POA: Diagnosis not present

## 2021-07-11 DIAGNOSIS — M25561 Pain in right knee: Secondary | ICD-10-CM | POA: Diagnosis not present

## 2021-07-11 DIAGNOSIS — M25572 Pain in left ankle and joints of left foot: Secondary | ICD-10-CM

## 2021-07-11 DIAGNOSIS — M25562 Pain in left knee: Secondary | ICD-10-CM | POA: Diagnosis not present

## 2021-07-11 NOTE — Therapy (Signed)
OUTPATIENT PHYSICAL THERAPY TREATMENT   Patient Name: Brent Gordon MRN: [id number] DOB:06/08/1960, 61 y.o., male Today's Date: 07/11/2021   PT End of Session - 07/11/21 1620     Visit Number 11    Date for PT Re-Evaluation 07/26/21    Authorization Type BC[employer]    PT Start Time 1620    PT Stop Time 1700    PT Time Calculation (min) 40 min    Behavior During Therapy [facility] for tasks assessed/performed                      Past Medical History:  Diagnosis Date   Allergy    sees Dr. Lowella Grip   Obesity    Pain, low back    Sleep apnea    PSG 12/01/06 see's Dr Chesley Mires   Varicose veins    Past Surgical History:  Procedure Laterality Date   COLONOSCOPY  08/01/2019   Dr. Wilford Corner, diverticulosis, internal hemorrhoids, no polyps, repeat in 5 yrs   FOOT SURGERY Left 09/26/2011   triple arthrodesis per Dr. Wylene Simmer   HERNIA REPAIR  2005/2006   Dr. Zella Richer   leg vein ablation     right lower, Dr. Linus Mako   TONSILLECTOMY  01/13/1965   Patient Active Problem List   Diagnosis Date Noted   Concussion 10/16/2020   Knee pain 05/16/2016   OBSTRUCTIVE SLEEP APNEA 10/16/2006   Acute low back pain with radicular symptoms, duration less than 6 weeks 09/24/2006    PCP: Dr. Alysia Penna  REFERRING PROVIDER: Dr. 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  Really busy at work.  I may not be able to come next week.    PERTINENT HISTORY: Acquired flat foot syndrome Left foot surgery 7 screws triple arthrodesis;  left knee meniscus repair 2 years ago Wears off the shelf  orthotics  PAIN:  Are you having pain? Yes NPRS scale: 3/10 Pain location: Legs and Lt shoulder   Aggravating factors: AM, walk on soft sand Relieving factors: after get warmed up  PRECAUTIONS: None  WEIGHT BEARING RESTRICTIONS No  FALLS:  Has patient fallen in last 6 months? No  LIVING ENVIRONMENT: Lives with: lives with their spouse Lives in: House/apartment Stairs:  OCCUPATION: sitting mostly  PATIENT GOALS strengthen legs and core   OBJECTIVE: 05/31/21  DIAGNOSTIC FINDINGS: not recently; presurgery  PATIENT SURVEYS:  FOTO 71%  07/09/21: 63    COGNITION:  Overall cognitive status: Within functional limits for tasks assessed     MUSCLE LENGTH: Hamstrings: Right 65 deg; Left 50 deg Thomas test: Right 0 deg; Left 5 deg Decreased bil gastroc lengths  POSTURE:   Right > left pes planus (severe on right)   LOWER EXTREMITY ROM:  Left ankle dorsiflexion 5 degrees; right 10 degrees;  no left ankle/foot inversion and eversion secondary to surgical hardware  LOWER EXTREMITY MMT:  MMT Right eval Rt  07/02/21 Left eval Right  07/02/21  Hip flexion 4+5 4+/5 4+ 4+/5  Hip extension 4  4   Hip abduction 3+  4-   Hip adduction      Hip internal  rotation      Hip external rotation      Knee flexion  4/5  4/5  Knee extension 4- 4/5 4 4/5  Ankle dorsiflexion 4  4   Ankle plantarflexion 4  4   Ankle inversion      Ankle eversion       (Blank rows = not tested)  Abdominal strength 4-/5  Pelvic drop with quadruped and 6 inch step down test   FUNCTIONAL TESTS:  SLS left 3 sec, right 2 sec;  able to rise from chair without UE support  Able to go up and down steps reciprocally with light railing support  GAIT: Comments: dec stride length  Left shoulder ROM: (seated) flexion 146, abduction 108, external rotation 40 degrees, internal rotation behind back to L4 Left shoulder MMT: flexion 4/5 with pain, abduction 4/5 with pain; external and internal rotation  4+/5 06/18/21: Lt shoulder flexion 145   TODAY'S TREATMENT: 6/29: NuStep: Level 3 arms/legs x 6 min-PT present to discuss progress Yellow band around wrists wall slides with lift off at top 10x Standing hip abduction and extension 3# added, standing on balance pad-2x10 bil- verbal cues for alignment Row: 20# 2x10 Standing rockerboard: x2 min  Step over 1 hurdle 15x each side Alternating step-taps x20 bil each Heel raises: on mini tramp x20 Weight shifting: 3 ways x1 min each cues to decrease UE support Standing active arch "lifts" 10x Review of shoulder flexion and abduction with thumb up (more comfortable)    Treatment on date: 07/09/21 NuStep: Level 3 arms/legs x 6 min-PT present to discuss progress Standing hip abduction and extension 3# added, standing on balance pad-2x10 bil- verbal cues for alignment Shoulder flexion 3# 2x10 Row: 20# 2x10 Standing rockerboard: x2 min  Obstacle course: walking over hurdles and unlevel balance tiles alternating with step-over-step Alternating step-taps x20 bil each Heel raises: on mini tramp x20 Weight shifting: 3 ways x1 min each Wall push-ups 2x10 Sit to stand: 5# kettlebell x20  Treatment on date: 07/04/21 NuStep: Level 3 arms/legs x 6 min-PT present to discuss progress Standing hip abduction and extension 3# added, standing on balance pad-2x10 bil- verbal cues for alignment Shoulder flexion 3# 2x10 Obstacle course: walking over hurdles and unlevel balance tiles alternating with step-over-step Alternating step-taps x20 bil each Heel raises: on mini tramp x20 Weight shifting: 3 ways x1 min each on minitramp- good pelvic alignment Wall push-ups 2x10 Sit to stand: 5# kettlebell x20  PATIENT EDUCATION:  Education details: Access Code: [email or web address] Person educated: Patient Education method: Explanation, Media planner, and Handouts Education comprehension: verbalized understanding   HOME EXERCISE PROGRAM: Access Code: [email or web address] URL:  [email or web address] Date: 06/04/2021 Prepared by: Claiborne Billings  Exercises - Clamshell  - 1 x daily - 7 x weekly - 1 sets - 10 reps - Sidelying Hip Abduction  - 1 x daily - 7 x weekly - 1 sets - 10 reps - Hip Flexor Stretch at Edge of Bed  - 1 x daily - 7 x weekly - 1 sets - 3 reps - 20 hold - Plank on Knees  - 1 x daily - 7 x weekly - 1 sets - 10 reps - Gastroc Stretch on Wall  - 3 x daily - 7 x weekly - 1 sets - 3 reps - 10 hold - Standing Single Leg Stance with Counter Support  - 2 x daily - 7 x weekly - 1 sets - 3 reps - 10 hold - Sit to Stand Without Arm Support  -  2 x daily - 7 x weekly - 2 sets - 10 reps - Seated Hamstring Stretch  - 3 x daily - 7 x weekly - 1 sets - 3 reps - 20 hold - Heel Raises with Counter Support  - 2 x daily - 7 x weekly - 2 sets - 10 reps ASSESSMENT:  CLINICAL IMPRESSION: Discussed areas of focus for foot/ankle and shoulder/scapular ex's for HEP next week if he is unable to come in.  Encouraged scapular strengthening to decrease overuse of biceps with using the fork lift controller.  Able to stabilize well with weight shifting on minitramp without UE support but SLS with step over hurdle is quite challenging.      OBJECTIVE IMPAIRMENTS decreased activity tolerance, difficulty walking, decreased ROM, decreased strength, impaired flexibility, and pain.   ACTIVITY LIMITATIONS shopping, community activity, occupation, and yard work.   PERSONAL FACTORS Past/current experiences and Time since onset of injury/illness/exacerbation are also affecting patient's functional outcome.    REHAB POTENTIAL: Good  CLINICAL DECISION MAKING: Stable/uncomplicated  EVALUATION COMPLEXITY: Low   GOALS: Goals reviewed with patient? Yes  SHORT TERM GOALS: Target date: 06/28/21   The patient will demonstrate knowledge of basic self care strategies and exercises to promote healing   Baseline: Goal status: MET  2.  The patient will have improved knee and hip  strength to at least 4/5 needed for standing, walking longer distances and descending stairs at home and in the community  Baseline:  Goal status: In progress  3.  The patient will report a 40% improvement in pain levels with functional activities which are currently difficult including mobility in the mornings Baseline: able to do most activity, 20-25% reduced pain (07/02/21) Goal status: In progress  4.  The patient will have improved shoulder elevation ROM to at least 160 degrees needed for grooming/dressing purposes as well as reaching high shelves  Baseline: 145 degrees (06/18/21) Goal status: in progress   LONG TERM GOALS: Target date: 07/26/21   The patient will be independent in a safe self progression of a home exercise program to promote further recovery of function   Baseline:  Goal status: In progress (06/25/21)  2.  The patient will have lumbo pelvic hip strength to grossly 4+/5 needed for heavier duty yard work and running Baseline:  Goal status: INITIAL  3.  The patient will have improved LE strength and proprioception needed to single leg stand 8-10 sec  Baseline:  Goal status: INITIAL  4.  The patient will have improved LE strength to descend steps without medial knee collapse and trunk lean  Baseline:improved stability (07/09/21) Goal status: In progress  5.  The patient will have improved FOTO score to 74% indicating improved function with less pain  Baseline: 63 Goal status: In progress    PLAN: PT FREQUENCY: 2x/week  PT DURATION: 8 weeks  PLANNED INTERVENTIONS: Therapeutic exercises, Therapeutic activity, Neuromuscular re-education, Balance training, Gait training, Patient/Family education, Joint mobilization, Aquatic Therapy, Dry Needling, Taping, Ultrasound, Ionotophoresis 47m/ml Dexamethasone, and Manual therapy                                           PLAN FOR NEXT SESSION: Hip, knee, ankle strength and flexibility, foot intrinsic strengthening.  Work on  [employer]  SRuben Im PT 07/11/21 5:09 PM Phone: [phone]: [address]  [place], [place] [address] Phone # [phone] Fax [phone]

## 2021-07-17 DIAGNOSIS — J3089 Other allergic rhinitis: Secondary | ICD-10-CM | POA: Diagnosis not present

## 2021-07-17 DIAGNOSIS — J3081 Allergic rhinitis due to animal (cat) (dog) hair and dander: Secondary | ICD-10-CM | POA: Diagnosis not present

## 2021-07-17 DIAGNOSIS — J301 Allergic rhinitis due to pollen: Secondary | ICD-10-CM | POA: Diagnosis not present

## 2021-07-23 ENCOUNTER — Ambulatory Visit: Payer: BC Managed Care – PPO | Attending: Family Medicine

## 2021-07-23 DIAGNOSIS — M25571 Pain in right ankle and joints of right foot: Secondary | ICD-10-CM | POA: Insufficient documentation

## 2021-07-23 DIAGNOSIS — M25561 Pain in right knee: Secondary | ICD-10-CM | POA: Insufficient documentation

## 2021-07-23 DIAGNOSIS — M25612 Stiffness of left shoulder, not elsewhere classified: Secondary | ICD-10-CM | POA: Diagnosis not present

## 2021-07-23 DIAGNOSIS — G8929 Other chronic pain: Secondary | ICD-10-CM | POA: Insufficient documentation

## 2021-07-23 DIAGNOSIS — M25562 Pain in left knee: Secondary | ICD-10-CM | POA: Insufficient documentation

## 2021-07-23 DIAGNOSIS — M25512 Pain in left shoulder: Secondary | ICD-10-CM | POA: Insufficient documentation

## 2021-07-23 NOTE — Therapy (Signed)
OUTPATIENT PHYSICAL THERAPY TREATMENT   Patient Name: Brent Gordon MRN: [id number] DOB:1960/04/14, 61 y.o., male Today's Date: 07/23/2021   PT End of Session - 07/23/21 0802     Visit Number 12    Date for PT Re-Evaluation 07/26/21    Authorization Type BCBS    PT Start Time 0730    PT Stop Time 0802    PT Time Calculation (min) 32 min    Activity Tolerance Patient tolerated treatment well    Behavior During Therapy [facility] for tasks assessed/performed                       Past Medical History:  Diagnosis Date   Allergy    sees Dr. Lowella Grip   Obesity    Pain, low back    Sleep apnea    PSG 12/01/06 see's Dr Chesley Mires   Varicose veins    Past Surgical History:  Procedure Laterality Date   COLONOSCOPY  08/01/2019   Dr. Wilford Corner, diverticulosis, internal hemorrhoids, no polyps, repeat in 5 yrs   FOOT SURGERY Left 09/26/2011   triple arthrodesis per Dr. Wylene Simmer   HERNIA REPAIR  2005/2006   Dr. Zella Richer   leg vein ablation     right lower, Dr. Linus Mako   TONSILLECTOMY  01/13/1965   Patient Active Problem List   Diagnosis Date Noted   Concussion 10/16/2020   Knee pain 05/16/2016   OBSTRUCTIVE SLEEP APNEA 10/16/2006   Acute low back pain with radicular symptoms, duration less than 6 weeks 09/24/2006    PCP: Dr. Alysia Penna  REFERRING PROVIDER: Dr. Hulan Saas  REFERRING DIAG: U88.916, G89.29 chronic left shoulder pain;  M25.561, M25.562, G89.29 Chronic pain of both knees; M25.571 chronic pain of both ankles  THERAPY DIAG: bil knee pain; bil ankle pain; left shoulder pain; weakness   Rationale for Evaluation and Treatment Rehabilitation  ONSET DATE: 01/13/21  SUBJECTIVE:   SUBJECTIVE STATEMENT:   I'm pretty tired after work.  I've been working long day.   I'm thinking about joining the gym at [facility].    PERTINENT HISTORY: Acquired flat foot syndrome Left foot surgery 7 screws triple arthrodesis;  left knee meniscus  repair 2 years ago Wears off the shelf orthotics  PAIN:  Are you having pain? Yes NPRS scale: 3/10 Pain location: Legs and Lt shoulder   Aggravating factors: AM, walk on soft sand Relieving factors: after get warmed up  PRECAUTIONS: None  WEIGHT BEARING RESTRICTIONS No  FALLS:  Has patient fallen in last 6 months? No  LIVING ENVIRONMENT: Lives with: lives with their spouse Lives in: House/apartment Stairs:  OCCUPATION: sitting mostly  PATIENT GOALS strengthen legs and core   OBJECTIVE: 05/31/21  DIAGNOSTIC FINDINGS: not recently; presurgery  PATIENT SURVEYS:  FOTO 71%  07/09/21: 63    COGNITION:  Overall cognitive status: Within functional limits for tasks assessed     MUSCLE LENGTH: Hamstrings: Right 65 deg; Left 50 deg Thomas test: Right 0 deg; Left 5 deg Decreased bil gastroc lengths  POSTURE:   Right > left pes planus (severe on right)   LOWER EXTREMITY ROM:  Left ankle dorsiflexion 5 degrees; right 10 degrees;  no left ankle/foot inversion and eversion secondary to surgical hardware  LOWER EXTREMITY MMT:  MMT Right eval Rt  07/02/21 Left eval Right  07/02/21  Hip flexion 4+5 4+/5 4+ 4+/5  Hip extension 4  4   Hip abduction 3+  4-   Hip  adduction      Hip internal rotation      Hip external rotation      Knee flexion  4/5  4/5  Knee extension 4- 4/5 4 4/5  Ankle dorsiflexion 4  4   Ankle plantarflexion 4  4   Ankle inversion      Ankle eversion       (Blank rows = not tested)  Abdominal strength 4-/5  Pelvic drop with quadruped and 6 inch step down test   FUNCTIONAL TESTS:  SLS left 3 sec, right 2 sec;  able to rise from chair without UE support  Able to go up and down steps reciprocally with light railing support  GAIT: Comments: dec stride length  Left shoulder ROM: (seated) flexion 146, abduction 108, external rotation 40 degrees, internal rotation behind back to L4 Left shoulder MMT: flexion 4/5 with pain, abduction 4/5 with pain;  external and internal rotation 4+/5 06/18/21: Lt shoulder flexion 145   TODAY'S TREATMENT: 7/11: NuStep: Level 3 arms/legs x 6 min-PT present to discuss progress Yellow band around wrists wall slides with lift off at top 10x Standing hip abduction and extension 3# added, standing on balance pad-2x10 bil- verbal cues for alignment Row: 20# 2x10 Standing rockerboard: x2 min  Step over hurdles forward and lateral- some instability with sidestepping  Alternating step-taps x20 bil each Heel raises: on mini tramp x20 Weight shifting: 3 ways x1 min each cues to decrease UE support Review of shoulder flexion and abduction with thumb up (more comfortable)  6/29: NuStep: Level 3 arms/legs x 6 min-PT present to discuss progress Yellow band around wrists wall slides with lift off at top 10x Standing hip abduction and extension 3# added, standing on balance pad-2x10 bil- verbal cues for alignment Row: 20# 2x10 Standing rockerboard: x2 min  Stepping over 1 hurdle  Alternating step-taps x20 bil each Heel raises: on mini tramp x20 Weight shifting: 3 ways x1 min each cues to decrease UE support Standing active arch "lifts" 10x Review of shoulder flexion and abduction with thumb up (more comfortable)    Treatment on date: 07/09/21 NuStep: Level 3 arms/legs x 6 min-PT present to discuss progress Standing hip abduction and extension 3# added, standing on balance pad-2x10 bil- verbal cues for alignment Shoulder flexion 3# 2x10 Row: 20# 2x10 Standing rockerboard: x2 min  Obstacle course: walking over hurdles and unlevel balance tiles alternating with step-over-step Alternating step-taps x20 bil each Heel raises: on mini tramp x20 Weight shifting: 3 ways x1 min each Wall push-ups 2x10 Sit to stand: 5# kettlebell x20   PATIENT EDUCATION:  Education details: Access Code: [email or web address] Person educated: Patient Education method: Explanation, Media planner, and Handouts Education comprehension:  verbalized understanding   HOME EXERCISE PROGRAM: Access Code: [email or web address] URL: [email or web address] Date: 06/04/2021 Prepared by: Claiborne Billings  Exercises - Clamshell  - 1 x daily - 7 x weekly - 1 sets - 10 reps - Sidelying Hip Abduction  - 1 x daily - 7 x weekly - 1 sets - 10 reps - Hip Flexor Stretch at Edge of Bed  - 1 x daily - 7 x weekly - 1 sets - 3 reps - 20 hold - Plank on Knees  - 1 x daily - 7 x weekly - 1 sets - 10 reps - Gastroc Stretch on Wall  - 3 x daily - 7 x weekly - 1 sets - 3 reps - 10 hold - Standing Single Leg Stance with Counter Support  - 2 x daily -  7 x weekly - 1 sets - 3 reps - 10 hold - Sit to Stand Without Arm Support  - 2 x daily - 7 x weekly - 2 sets - 10 reps - Seated Hamstring Stretch  - 3 x daily - 7 x weekly - 1 sets - 3 reps - 20 hold - Heel Raises with Counter Support  - 2 x daily - 7 x weekly - 2 sets - 10 reps ASSESSMENT:  CLINICAL IMPRESSION: Pt has been busy at work over the past couple of weeks so limited time for exercise.  Pt would like to consider joining [employer] to continue with exercise after discharge.  Pt reports 20-25% overall reduction in pain since the start of care. Pt continues to be challenged with balance and alignment due to chronic nature of condition.  PT provided verbal and tactile cues throughout session and pt tolerate well.     OBJECTIVE IMPAIRMENTS decreased activity tolerance, difficulty walking, decreased ROM, decreased strength, impaired flexibility, and pain.   ACTIVITY LIMITATIONS shopping, community activity, occupation, and yard work.   PERSONAL FACTORS Past/current experiences and Time since onset of injury/illness/exacerbation are also affecting patient's functional outcome.    REHAB POTENTIAL: Good  CLINICAL DECISION MAKING: Stable/uncomplicated  EVALUATION COMPLEXITY: Low   GOALS: Goals reviewed with patient? Yes  SHORT TERM GOALS: Target date: 06/28/21   The patient will demonstrate knowledge of  basic self care strategies and exercises to promote healing   Baseline: Goal status: MET  2.  The patient will have improved knee and hip strength to at least 4/5 needed for standing, walking longer distances and descending stairs at home and in the community  Baseline:  Goal status: In progress  3.  The patient will report a 40% improvement in pain levels with functional activities which are currently difficult including mobility in the mornings Baseline: able to do most activity, 20-25% reduced pain (07/23/21) Goal status: In progress  4.  The patient will have improved shoulder elevation ROM to at least 160 degrees needed for grooming/dressing purposes as well as reaching high shelves  Baseline: 145 degrees (06/18/21) Goal status: in progress   LONG TERM GOALS: Target date: 07/26/21   The patient will be independent in a safe self progression of a home exercise program to promote further recovery of function   Baseline:  Goal status: In progress (06/25/21)  2.  The patient will have lumbo pelvic hip strength to grossly 4+/5 needed for heavier duty yard work and running Baseline:  Goal status: INITIAL  3.  The patient will have improved LE strength and proprioception needed to single leg stand 8-10 sec  Baseline:  Goal status: INITIAL  4.  The patient will have improved LE strength to descend steps without medial knee collapse and trunk lean  Baseline:improved stability (07/09/21) Goal status: In progress  5.  The patient will have improved FOTO score to 74% indicating improved function with less pain  Baseline: 63 Goal status: In progress    PLAN: PT FREQUENCY: 2x/week  PT DURATION: 8 weeks  PLANNED INTERVENTIONS: Therapeutic exercises, Therapeutic activity, Neuromuscular re-education, Balance training, Gait training, Patient/Family education, Joint mobilization, Aquatic Therapy, Dry Needling, Taping, Ultrasound, Ionotophoresis 76m/ml Dexamethasone, and Manual therapy                                            PLAN FOR NEXT SESSION:  ERO to determine renewal at reduced frequency or D/C to gym exercises.  Pt would like to joint [employer].  Give FOTO and test strength.    Sigurd Sos, PT 07/23/21 8:03 AM  [place] [address], [place],  [address] Phone # [phone] Fax [phone]

## 2021-07-25 ENCOUNTER — Ambulatory Visit: Payer: BC Managed Care – PPO | Admitting: Physical Therapy

## 2021-07-25 DIAGNOSIS — G8929 Other chronic pain: Secondary | ICD-10-CM | POA: Diagnosis not present

## 2021-07-25 DIAGNOSIS — M25561 Pain in right knee: Secondary | ICD-10-CM | POA: Diagnosis not present

## 2021-07-25 DIAGNOSIS — M25612 Stiffness of left shoulder, not elsewhere classified: Secondary | ICD-10-CM | POA: Diagnosis not present

## 2021-07-25 DIAGNOSIS — M25512 Pain in left shoulder: Secondary | ICD-10-CM | POA: Diagnosis not present

## 2021-07-25 DIAGNOSIS — M25571 Pain in right ankle and joints of right foot: Secondary | ICD-10-CM

## 2021-07-25 DIAGNOSIS — M25562 Pain in left knee: Secondary | ICD-10-CM | POA: Diagnosis not present
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
## 2021-07-30 ENCOUNTER — Encounter: Payer: Self-pay | Admitting: Sports Medicine

## 2021-07-31 ENCOUNTER — Other Ambulatory Visit: Payer: Self-pay | Admitting: Sports Medicine

## 2021-07-31 DIAGNOSIS — G8929 Other chronic pain: Secondary | ICD-10-CM

## 2021-07-31 DIAGNOSIS — G4733 Obstructive sleep apnea (adult) (pediatric): Secondary | ICD-10-CM | POA: Diagnosis not present

## 2021-08-05 ENCOUNTER — Ambulatory Visit: Payer: BC Managed Care – PPO | Admitting: Rehabilitative and Restorative Service Providers"

## 2021-08-05 ENCOUNTER — Encounter: Payer: Self-pay | Admitting: Rehabilitative and Restorative Service Providers"

## 2021-08-05 DIAGNOSIS — M25562 Pain in left knee: Secondary | ICD-10-CM | POA: Diagnosis not present

## 2021-08-05 DIAGNOSIS — M25571 Pain in right ankle and joints of right foot: Secondary | ICD-10-CM

## 2021-08-05 DIAGNOSIS — M25612 Stiffness of left shoulder, not elsewhere classified: Secondary | ICD-10-CM | POA: Diagnosis not present

## 2021-08-05 DIAGNOSIS — G8929 Other chronic pain: Secondary | ICD-10-CM

## 2021-08-05 DIAGNOSIS — M25512 Pain in left shoulder: Secondary | ICD-10-CM | POA: Diagnosis not present

## 2021-08-05 DIAGNOSIS — M25561 Pain in right knee: Secondary | ICD-10-CM | POA: Diagnosis not present

## 2021-08-05 NOTE — Therapy (Signed)
OUTPATIENT PHYSICAL THERAPY TREATMENT NOTE   Patient Name: Brent Gordon MRN: [id number] DOB:01/09/1961, 61 y.o., male Today's Date: 08/05/2021   PT End of Session - 08/05/21 0736     Visit Number 14    Date for PT Re-Evaluation 10/17/21    Authorization Type [employer]    PT Start Time 0730    PT Stop Time 0800    PT Time Calculation (min) 30 min    Activity Tolerance Patient tolerated treatment well    Behavior During Therapy [facility] for tasks assessed/performed                         Past Medical History:  Diagnosis Date   Allergy    sees Dr. Lowella Grip   Obesity    Pain, low back    Sleep apnea    PSG 12/01/06 see's Dr Chesley Mires   Varicose veins    Past Surgical History:  Procedure Laterality Date   COLONOSCOPY  08/01/2019   Dr. Wilford Corner, diverticulosis, internal hemorrhoids, no polyps, repeat in 5 yrs   FOOT SURGERY Left 09/26/2011   triple arthrodesis per Dr. Wylene Simmer   HERNIA REPAIR  2005/2006   Dr. Zella Richer   leg vein ablation     right lower, Dr. Linus Mako   TONSILLECTOMY  01/13/1965   Patient Active Problem List   Diagnosis Date Noted   Concussion 10/16/2020   Knee pain 05/16/2016   OBSTRUCTIVE SLEEP APNEA 10/16/2006   Acute low back pain with radicular symptoms, duration less than 6 weeks 09/24/2006    PCP: Dr. Alysia Penna  REFERRING PROVIDER: Dr. Glennon Mac (pt states he has not been seen by Dr. Tamala Julian as previously documented)  REFERRING DIAG: M25.512, G89.29 chronic left shoulder pain;  M25.561, M25.562, G89.29 Chronic pain of both knees; M25.571 chronic pain of both ankles  THERAPY DIAG: bil knee pain; bil ankle pain; left shoulder pain; weakness   Rationale for Evaluation and Treatment Rehabilitation  ONSET DATE: 01/13/21  SUBJECTIVE:   SUBJECTIVE STATEMENT: Pt reports since initial evaluation, he feels approx 50% better.  States that he is having some increased pain today.     Acquired flat foot  syndrome Left foot surgery 7 screws triple arthrodesis;  left knee meniscus repair 2 years ago Wears off the shelf orthotics  PAIN:  Are you having pain? Yes NPRS scale: 5/10 Pain location: Legs and Lt shoulder   Aggravating factors: AM, walk on soft sand Relieving factors: after get warmed up  PRECAUTIONS: None  WEIGHT BEARING RESTRICTIONS No  FALLS:  Has patient fallen in last 6 months? No  LIVING ENVIRONMENT: Lives with: lives with their spouse Lives in: House/apartment Stairs:  OCCUPATION: sitting mostly  PATIENT GOALS strengthen legs and core   OBJECTIVE:   DIAGNOSTIC FINDINGS: not recently; presurgery  PATIENT SURVEYS:  FOTO 71%  (lower extremity) 07/09/21: 63  7/13: 67%   COGNITION:  Overall cognitive status: Within functional limits for tasks assessed     MUSCLE LENGTH: Hamstrings: Right 65 deg; Left 50 deg Thomas test: Right 0 deg; Left 5 deg Decreased bil gastroc lengths 7/13:  Much improved hip flexor lengths to 20 degrees but with pt states more pull on the left  POSTURE:   Right > left pes planus (severe on right)   LOWER EXTREMITY ROM:   Left ankle dorsiflexion 5 degrees; right 10 degrees;  no left ankle/foot inversion and eversion secondary to surgical hardware 7/13: No significant  change in right ankle ROM  LOWER EXTREMITY MMT:  MMT Right eval Rt  07/02/21 Left eval Right  07/02/21 7/13  Hip flexion 4+5 4+/5 4+ 4+/5 5  Hip extension 4  4    Hip abduction 3+  4-  Right 4-; left 4+/5   Hip adduction       Hip internal rotation       Hip external rotation       Knee flexion  4/5  4/5 5  Knee extension 4- 4/5 4 4/5  Right 4; left 4/5  Ankle dorsiflexion 4  4    Ankle plantarflexion 4  4    Ankle inversion       Ankle eversion        (Blank rows = not tested)  Abdominal strength 4-/5  Pelvic drop with quadruped and 6 inch step down test  7/13:  Left pelvic drop and trunk rotation noted with 6 inch step down test;  right ankle ROM  too limited for step down test  FUNCTIONAL TESTS:  7/13: SLS left 3 sec, right 3 sec;  able to rise from chair without UE support  Able to go up and down steps reciprocally with light railing support  GAIT: Comments: dec stride length  Left shoulder ROM: (seated) flexion 146, abduction 108, external rotation 40 degrees, internal rotation behind back to L4 Left shoulder MMT: flexion 4/5 with pain, abduction 4/5 with pain; external and internal rotation 4+/5 06/18/21: Lt shoulder flexion 145  7/13: Flexion 145 (164 supine), abduction 115, external rotation 67, internal rotation, L1; less pain with thumb up with elevation;   Shoulder MMT:  flexion 4/5 with pain; abduction 4/5 with pain, external and internal rotation 4+/5  TODAY'S TREATMENT:  08/05/2021: Nu Step L4 5 min while discussing status/progress Seated Rows:  25# 2x10 Hip Matrix:  40# for hip abduction and hip extension x10 bilat Supine shoulder flexion with 2# 2x10 LUE Sidelying shoulder abduction and ER 2# 2x10 LUE each Heel raises: on mini tramp x20 Weight shifting: 3 ways x1 min each cues to decrease UE support  7/13: Nu Step L4 5 min while discussing status/progress Discussion of findings of objective re-tests with areas of focus:  deficits in bil hip abductor strength, decreased motor control of left quads (eccentrically with step downs);  decreased shoulder motor control against gravity with elevation (flexion and abduction)  Supine with 2# flexion shoulder per HEP Sidelying with 2# abduction of shoulder per HEP Sidelying hip abduction right 10x Therapeutic activity: up/down steps/curb, reaching  7/11: NuStep: Level 3 arms/legs x 6 min-PT present to discuss progress Yellow band around wrists wall slides with lift off at top 10x Standing hip abduction and extension 3# added, standing on balance pad-2x10 bil- verbal cues for alignment Row: 20# 2x10 Standing rockerboard: x2 min  Step over hurdles forward and lateral-  some instability with sidestepping  Alternating step-taps x20 bil each Heel raises: on mini tramp x20 Weight shifting: 3 ways x1 min each cues to decrease UE support Review of shoulder flexion and abduction with thumb up (more comfortable)   PATIENT EDUCATION:  Education details: Access Code: [email or web address] Person educated: Patient Education method: Explanation, Demonstration, and Handouts Education comprehension: verbalized understanding   HOME EXERCISE PROGRAM: Access Code: [email or web address] URL: [email or web address] Date: 07/25/2021 Prepared by: Ruben Im  Exercises - Clamshell  - 1 x daily - 7 x weekly - 1 sets - 10 reps - Sidelying Hip Abduction  - 1 x daily - 7 x  weekly - 1 sets - 10 reps - Hip Flexor Stretch at Edge of Bed  - 1 x daily - 7 x weekly - 1 sets - 3 reps - 20 hold - Plank on Knees  - 1 x daily - 7 x weekly - 1 sets - 10 reps - Gastroc Stretch on Wall  - 3 x daily - 7 x weekly - 1 sets - 3 reps - 10 hold - Standing Single Leg Stance with Counter Support  - 2 x daily - 7 x weekly - 1 sets - 3 reps - 10 hold - Sit to Stand Without Arm Support  - 2 x daily - 7 x weekly - 2 sets - 10 reps - Seated Hamstring Stretch  - 3 x daily - 7 x weekly - 1 sets - 3 reps - 20 hold - Heel Raises with Counter Support  - 2 x daily - 7 x weekly - 2 sets - 10 reps - Supine Shoulder Flexion with Free Weight (Mirrored)  - 1 x daily - 7 x weekly - 2 sets - 10 reps - Sidelying Shoulder Abduction Full Range of Motion with Dumbbell  - 1 x daily - 7 x weekly - 2 sets - 10 reps   ASSESSMENT:  CLINICAL IMPRESSION: Mr Stave presents to skilled rehabilitation with reports of increased pain today, but stating that he has been performing his HEP and did work on Saturday.  Pt with good participation during session and he was able to progress towards increased strengthening.  Pt continues to require skilled PT to progress towards goal related activities.    OBJECTIVE IMPAIRMENTS decreased  activity tolerance, difficulty walking, decreased ROM, decreased strength, impaired flexibility, and pain.   ACTIVITY LIMITATIONS shopping, community activity, occupation, and yard work.   PERSONAL FACTORS Past/current experiences and Time since onset of injury/illness/exacerbation are also affecting patient's functional outcome.    REHAB POTENTIAL: Good  CLINICAL DECISION MAKING: Stable/uncomplicated  EVALUATION COMPLEXITY: Low   GOALS: Goals reviewed with patient? Yes  SHORT TERM GOALS: Target date: 06/28/21   The patient will demonstrate knowledge of basic self care strategies and exercises to promote healing   Baseline: Goal status: MET  2.  The patient will have improved knee and hip strength to at least 4/5 needed for standing, walking longer distances and descending stairs at home and in the community  Baseline:  Goal status: In progress  3.  The patient will report a 40% improvement in pain levels with functional activities which are currently difficult including mobility in the mornings Baseline: able to do most activity, 20-25% reduced pain (07/23/21) Goal status: Goal Met 08/05/21  4.  The patient will have improved shoulder elevation ROM to at least 160 degrees needed for grooming/dressing purposes as well as reaching high shelves  Baseline: 145 degrees (06/18/21) Goal status: in progress   LONG TERM GOALS: Target date: 10/17/21   The patient will be independent in a safe self progression of a home exercise program to promote further recovery of function   Baseline:  Goal status: In progress   2.  The patient will have bil hip abduction strength to grossly 4+/5 needed for heavier duty yard work  Baseline:  Goal status: in progress  3.  The patient will have improved LE strength and proprioception needed to single leg stand 8-10 sec  Baseline:  Goal status: INITIAL  4.  The patient will have improved quad strength to 4+/5 descend steps without medial knee  collapse  and trunk lean  Baseline:improved stability (07/09/21) Goal status: In progress  5.  The patient will have improved FOTO score to 74% indicating improved function with less pain  Baseline: 63 Goal status: In progress    PLAN: PT FREQUENCY: 1x/week to biweekly  PT DURATION: 12 weeks  PLANNED INTERVENTIONS: Therapeutic exercises, Therapeutic activity, Neuromuscular re-education, Balance training, Gait training, Patient/Family education, Joint mobilization, Aquatic Therapy, Dry Needling, Taping, Ultrasound, Ionotophoresis 7m/ml Dexamethasone, and Manual therapy                                           PLAN FOR NEXT SESSION: weekly to biweekly tapering over 12 weeks; work on bil glute medius strengthening;  left quad strengthening; supine and sidelying left shoulder strengthening with light weight; recommend pt follow up with MD regarding shoulder prolonged limitations and pain   SJuel Burrow PT 08/05/21 8:44 AM   [facility] [address] [place] [address]Phone # [phone] [phone]

## 2021-08-15 ENCOUNTER — Ambulatory Visit
Admission: RE | Admit: 2021-08-15 | Discharge: 2021-08-15 | Disposition: A | Discharge status: Home / Self Care | Payer: BC Managed Care – PPO | Source: Ambulatory Visit | Attending: Sports Medicine | Admitting: Sports Medicine

## 2021-08-15 DIAGNOSIS — G8929 Other chronic pain: Secondary | ICD-10-CM | POA: Diagnosis not present

## 2021-08-15 DIAGNOSIS — J3081 Allergic rhinitis due to animal (cat) (dog) hair and dander: Secondary | ICD-10-CM | POA: Diagnosis not present

## 2021-08-15 DIAGNOSIS — S46012A Strain of muscle(s) and tendon(s) of the rotator cuff of left shoulder, initial encounter: Secondary | ICD-10-CM | POA: Diagnosis not present

## 2021-08-15 DIAGNOSIS — J3089 Other allergic rhinitis: Secondary | ICD-10-CM | POA: Diagnosis not present

## 2021-08-15 DIAGNOSIS — M25512 Pain in left shoulder: Secondary | ICD-10-CM | POA: Diagnosis not present

## 2021-08-15 DIAGNOSIS — J301 Allergic rhinitis due to pollen: Secondary | ICD-10-CM | POA: Diagnosis not present

## 2021-08-15 MED ORDER — IOPAMIDOL (ISOVUE-M 200) INJECTION 41%
13.0000 mL | Freq: Once | INTRAMUSCULAR | Status: AC
  Administered 2021-08-15: 13 mL via INTRA_ARTICULAR

## 2021-08-20 ENCOUNTER — Ambulatory Visit: Payer: BC Managed Care – PPO | Attending: Family Medicine

## 2021-08-20 DIAGNOSIS — M25562 Pain in left knee: Secondary | ICD-10-CM | POA: Diagnosis not present

## 2021-08-20 DIAGNOSIS — M25572 Pain in left ankle and joints of left foot: Secondary | ICD-10-CM | POA: Diagnosis not present

## 2021-08-20 DIAGNOSIS — G8929 Other chronic pain: Secondary | ICD-10-CM | POA: Diagnosis not present

## 2021-08-20 DIAGNOSIS — M25512 Pain in left shoulder: Secondary | ICD-10-CM | POA: Insufficient documentation

## 2021-08-20 DIAGNOSIS — M25612 Stiffness of left shoulder, not elsewhere classified: Secondary | ICD-10-CM | POA: Diagnosis not present

## 2021-08-20 DIAGNOSIS — M25561 Pain in right knee: Secondary | ICD-10-CM | POA: Insufficient documentation

## 2021-08-20 DIAGNOSIS — M25571 Pain in right ankle and joints of right foot: Secondary | ICD-10-CM | POA: Diagnosis not present

## 2021-08-20 NOTE — Therapy (Signed)
OUTPATIENT PHYSICAL THERAPY TREATMENT NOTE   Patient Name: Brent Gordon MRN: [id number] DOB:14-Jan-1961, 61 y.o., male Today's Date: 08/20/2021   PT End of Session - 08/20/21 0759     Visit Number 15    Date for PT Re-Evaluation 10/17/21    Authorization Type [employer]    PT Start Time 0729    PT Stop Time 0802    PT Time Calculation (min) 33 min    Activity Tolerance Patient tolerated treatment well    Behavior During Therapy [facility] for tasks assessed/performed                          Past Medical History:  Diagnosis Date   Allergy    sees Dr. Lowella Grip   Obesity    Pain, low back    Sleep apnea    PSG 12/01/06 see's Dr Chesley Mires   Varicose veins    Past Surgical History:  Procedure Laterality Date   COLONOSCOPY  08/01/2019   Dr. Wilford Corner, diverticulosis, internal hemorrhoids, no polyps, repeat in 5 yrs   FOOT SURGERY Left 09/26/2011   triple arthrodesis per Dr. Wylene Simmer   HERNIA REPAIR  2005/2006   Dr. Zella Richer   leg vein ablation     right lower, Dr. Linus Mako   TONSILLECTOMY  01/13/1965   Patient Active Problem List   Diagnosis Date Noted   Concussion 10/16/2020   Knee pain 05/16/2016   OBSTRUCTIVE SLEEP APNEA 10/16/2006   Acute low back pain with radicular symptoms, duration less than 6 weeks 09/24/2006    PCP: Dr. Alysia Penna  REFERRING PROVIDER: Dr. Glennon Mac (pt states he has not been seen by Dr. Tamala Julian as previously documented)  REFERRING DIAG: M25.512, G89.29 chronic left shoulder pain;  M25.561, M25.562, G89.29 Chronic pain of both knees; M25.571 chronic pain of both ankles  THERAPY DIAG: bil knee pain; bil ankle pain; left shoulder pain; weakness   Rationale for Evaluation and Treatment Rehabilitation  ONSET DATE: 01/13/21  SUBJECTIVE:   SUBJECTIVE STATEMENT: I got an MRI on my Lt shoulder- severe degeneration and partial supraspinatus tear   Acquired flat foot syndrome Left foot surgery 7 screws  triple arthrodesis;  left knee meniscus repair 2 years ago Wears off the shelf orthotics  PAIN:  Are you having pain? Yes NPRS scale: 4/10 Pain location: Legs and Lt shoulder   Aggravating factors: AM, walk on soft sand Relieving factors: after get warmed up  PRECAUTIONS: None  WEIGHT BEARING RESTRICTIONS No  FALLS:  Has patient fallen in last 6 months? No  LIVING ENVIRONMENT: Lives with: lives with their spouse Lives in: House/apartment Stairs:  OCCUPATION: sitting mostly  PATIENT GOALS strengthen legs and core   OBJECTIVE:   DIAGNOSTIC FINDINGS:  08/2021: Lt shoulder IMPRESSION: 1. Moderate partial-thickness tears of the anterior greater than mid AP dimension of the supraspinatus distal critical zone and tendon footprint. Minimal anterior supraspinatus muscle atrophy. 2. Mild degenerative changes of the acromioclavicular joint. Minimal downsloping of the anterolateral acromion. 3. Severe glenohumeral osteoarthritis. 4. Degenerative chondrolabral junction tears at the posteroinferior through posterosuperior aspects of the glenoid labrum.  PATIENT SURVEYS:  FOTO 71%  (lower extremity) 07/09/21: 63  7/13: 67%   COGNITION:  Overall cognitive status: Within functional limits for tasks assessed    MUSCLE LENGTH: Hamstrings: Right 65 deg; Left 50 deg Thomas test: Right 0 deg; Left 5 deg Decreased bil gastroc lengths 7/13:  Much improved hip flexor lengths  to 20 degrees but with pt states more pull on the left  POSTURE:   Right > left pes planus (severe on right)   LOWER EXTREMITY ROM:   Left ankle dorsiflexion 5 degrees; right 10 degrees;  no left ankle/foot inversion and eversion secondary to surgical hardware 7/13: No significant change in right ankle ROM  LOWER EXTREMITY MMT:  MMT Right eval Rt  07/02/21 Left eval Right  07/02/21 7/13  Hip flexion 4+5 4+/5 4+ 4+/5 5  Hip extension 4  4    Hip abduction 3+  4-  Right 4-; left 4+/5   Hip adduction        Hip internal rotation       Hip external rotation       Knee flexion  4/5  4/5 5  Knee extension 4- 4/5 4 4/5  Right 4; left 4/5  Ankle dorsiflexion 4  4    Ankle plantarflexion 4  4    Ankle inversion       Ankle eversion        (Blank rows = not tested)  Abdominal strength 4-/5  Pelvic drop with quadruped and 6 inch step down test  7/13:  Left pelvic drop and trunk rotation noted with 6 inch step down test;  right ankle ROM too limited for step down test  FUNCTIONAL TESTS:  7/13: SLS left 3 sec, right 3 sec;  able to rise from chair without UE support  Able to go up and down steps reciprocally with light railing support  GAIT: Comments: dec stride length  Left shoulder ROM: (seated) flexion 146, abduction 108, external rotation 40 degrees, internal rotation behind back to L4 Left shoulder MMT: flexion 4/5 with pain, abduction 4/5 with pain; external and internal rotation 4+/5 06/18/21: Lt shoulder flexion 145  7/13: Flexion 145 (164 supine), abduction 115, external rotation 67, internal rotation, L1; less pain with thumb up with elevation;   Shoulder MMT:  flexion 4/5 with pain; abduction 4/5 with pain, external and internal rotation 4+/5  TODAY'S TREATMENT:  08/20/2021: Nu Step L4 5 min while discussing status/progress Seated Rows:  25# 2x10 Hip Matrix:  40# for hip abduction and hip extension x10 bilat Supine shoulder flexion with 2# 2x10 LUE Sidelying shoulder abduction and ER 2# 2x10 LUE each Heel raises: on mini tramp x20 Weight shifting: 3 ways x1 min each cues to decrease UE support Sidestepping with blue band around thighs along mat table x 4 laps   08/05/2021: Nu Step L4 5 min while discussing status/progress Seated Rows:  25# 2x10 Hip Matrix:  40# for hip abduction and hip extension x10 bilat Supine shoulder flexion with 2# 2x10 LUE Sidelying shoulder abduction and ER 2# 2x10 LUE each Heel raises: on mini tramp x20 Weight shifting: 3 ways x1 min each cues to  decrease UE support  7/13: Nu Step L4 5 min while discussing status/progress Discussion of findings of objective re-tests with areas of focus:  deficits in bil hip abductor strength, decreased motor control of left quads (eccentrically with step downs);  decreased shoulder motor control against gravity with elevation (flexion and abduction)  Supine with 2# flexion shoulder per HEP Sidelying with 2# abduction of shoulder per HEP Sidelying hip abduction right 10x Therapeutic activity: up/down steps/curb, reaching   PATIENT EDUCATION:  Education details: Access Code: [email or web address] Person educated: Patient Education method: Explanation, Media planner, and Handouts Education comprehension: verbalized understanding   HOME EXERCISE PROGRAM: Access Code: [email or web address] URL: [email or web address] Date: 07/25/2021 Prepared by:  Ruben Im  Exercises - Clamshell  - 1 x daily - 7 x weekly - 1 sets - 10 reps - Sidelying Hip Abduction  - 1 x daily - 7 x weekly - 1 sets - 10 reps - Hip Flexor Stretch at Edge of Bed  - 1 x daily - 7 x weekly - 1 sets - 3 reps - 20 hold - Plank on Knees  - 1 x daily - 7 x weekly - 1 sets - 10 reps - Gastroc Stretch on Wall  - 3 x daily - 7 x weekly - 1 sets - 3 reps - 10 hold - Standing Single Leg Stance with Counter Support  - 2 x daily - 7 x weekly - 1 sets - 3 reps - 10 hold - Sit to Stand Without Arm Support  - 2 x daily - 7 x weekly - 2 sets - 10 reps - Seated Hamstring Stretch  - 3 x daily - 7 x weekly - 1 sets - 3 reps - 20 hold - Heel Raises with Counter Support  - 2 x daily - 7 x weekly - 2 sets - 10 reps - Supine Shoulder Flexion with Free Weight (Mirrored)  - 1 x daily - 7 x weekly - 2 sets - 10 reps - Sidelying Shoulder Abduction Full Range of Motion with Dumbbell  - 1 x daily - 7 x weekly - 2 sets - 10 reps   ASSESSMENT:  CLINICAL IMPRESSION: Pt received MRI results from Lt shoulder imaging and report states severe GH OA and supraspinatus  tear.  Pt is waiting to hear from the MD to discuss next steps.  Pt is consistent with strength exercises at home and is walking regularly. Pt did well with all exercises in the clinic and PT monitored for technique throughout.  Pt continues to require skilled PT to progress towards goal related activities.    OBJECTIVE IMPAIRMENTS decreased activity tolerance, difficulty walking, decreased ROM, decreased strength, impaired flexibility, and pain.   ACTIVITY LIMITATIONS shopping, community activity, occupation, and yard work.   PERSONAL FACTORS Past/current experiences and Time since onset of injury/illness/exacerbation are also affecting patient's functional outcome.    REHAB POTENTIAL: Good  CLINICAL DECISION MAKING: Stable/uncomplicated  EVALUATION COMPLEXITY: Low   GOALS: Goals reviewed with patient? Yes  SHORT TERM GOALS: Target date: 06/28/21   The patient will demonstrate knowledge of basic self care strategies and exercises to promote healing   Baseline: Goal status: MET  2.  The patient will have improved knee and hip strength to at least 4/5 needed for standing, walking longer distances and descending stairs at home and in the community  Baseline:  Goal status: In progress  3.  The patient will report a 40% improvement in pain levels with functional activities which are currently difficult including mobility in the mornings Baseline: able to do most activity, 20-25% reduced pain (07/23/21) Goal status: Goal Met 08/05/21  4.  The patient will have improved shoulder elevation ROM to at least 160 degrees needed for grooming/dressing purposes as well as reaching high shelves  Baseline: 145 degrees (06/18/21) Goal status: in progress   LONG TERM GOALS: Target date: 10/17/21   The patient will be independent in a safe self progression of a home exercise program to promote further recovery of function   Baseline:  Goal status: In progress   2.  The patient will have bil hip  abduction strength to grossly 4+/5 needed for heavier duty yard work  Baseline:  Goal status: in progress  3.  The patient will have improved LE strength and proprioception needed to single leg stand 8-10 sec  Baseline:  Goal status: INITIAL  4.  The patient will have improved quad strength to 4+/5 descend steps without medial knee collapse and trunk lean  Baseline:improved stability (07/09/21) Goal status: In progress  5.  The patient will have improved FOTO score to 74% indicating improved function with less pain  Baseline: 63 Goal status: In progress    PLAN: PT FREQUENCY: 1x/week to biweekly  PT DURATION: 12 weeks  PLANNED INTERVENTIONS: Therapeutic exercises, Therapeutic activity, Neuromuscular re-education, Balance training, Gait training, Patient/Family education, Joint mobilization, Aquatic Therapy, Dry Needling, Taping, Ultrasound, Ionotophoresis 26m/ml Dexamethasone, and Manual therapy                                           PLAN FOR NEXT SESSION: weekly to biweekly tapering over 12 weeks; work on bil glute medius strengthening;  left quad strengthening; supine and sidelying left shoulder strengthening with light weight, see what MD says about shoulder   KSigurd Sos PT 08/20/21 8:03 AM    [place] [place] [address]Phone # [phone] [phone]
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
## 2021-08-26 ENCOUNTER — Encounter: Payer: Self-pay | Admitting: Family Medicine

## 2021-08-26 ENCOUNTER — Ambulatory Visit (INDEPENDENT_AMBULATORY_CARE_PROVIDER_SITE_OTHER): Payer: BC Managed Care – PPO | Admitting: Family Medicine

## 2021-08-26 VITALS — BP 124/80 | HR 67 | Temp 98.5°F | Ht 70.0 in | Wt 266.0 lb

## 2021-08-26 DIAGNOSIS — Z0000 Encounter for general adult medical examination without abnormal findings: Secondary | ICD-10-CM | POA: Diagnosis not present

## 2021-08-26 LAB — HEMOGLOBIN A1C: Hgb A1c MFr Bld: 6.3 % (ref 4.6–6.5)

## 2021-08-26 LAB — CBC WITH DIFFERENTIAL/PLATELET
Basophils Absolute: 0 10*3/uL (ref 0.0–0.1)
Basophils Relative: 0.6 % (ref 0.0–3.0)
Eosinophils Absolute: 0.1 10*3/uL (ref 0.0–0.7)
Eosinophils Relative: 2.1 % (ref 0.0–5.0)
HCT: 43.9 % (ref 39.0–52.0)
Hemoglobin: 14.4 g/dL (ref 13.0–17.0)
Lymphocytes Relative: 22.2 % (ref 12.0–46.0)
Lymphs Abs: 1.2 10*3/uL (ref 0.7–4.0)
MCHC: 32.9 g/dL (ref 30.0–36.0)
MCV: 91.1 fl (ref 78.0–100.0)
Monocytes Absolute: 0.4 10*3/uL (ref 0.1–1.0)
Monocytes Relative: 7.9 % (ref 3.0–12.0)
Neutro Abs: 3.7 10*3/uL (ref 1.4–7.7)
Neutrophils Relative %: 67.2 % (ref 43.0–77.0)
Platelets: 230 10*3/uL (ref 150.0–400.0)
RBC: 4.82 Mil/uL (ref 4.22–5.81)
RDW: 13.8 % (ref 11.5–15.5)
WBC: 5.4 10*3/uL (ref 4.0–10.5)

## 2021-08-26 LAB — LIPID PANEL
Cholesterol: 126 mg/dL (ref 0–200)
HDL: 39.6 mg/dL (ref >39.00)
LDL Cholesterol: 75 mg/dL (ref 0–99)
NonHDL: 86.34
Total CHOL/HDL Ratio: 3
Triglycerides: 58 mg/dL (ref 0.0–149.0)
VLDL: 11.6 mg/dL (ref 0.0–40.0)

## 2021-08-26 LAB — HEPATIC FUNCTION PANEL
ALT: 19 U/L (ref 0–53)
AST: 19 U/L (ref 0–37)
Albumin: 4.4 g/dL (ref 3.5–5.2)
Alkaline Phosphatase: 48 U/L (ref 39–117)
Bilirubin, Direct: 0.2 mg/dL (ref 0.0–0.3)
Total Bilirubin: 0.8 mg/dL (ref 0.2–1.2)
Total Protein: 6.7 g/dL (ref 6.0–8.3)

## 2021-08-26 LAB — BASIC METABOLIC PANEL
BUN: 26 mg/dL — ABNORMAL HIGH (ref 6–23)
CO2: 26 mEq/L (ref 19–32)
Calcium: 8.9 mg/dL (ref 8.4–10.5)
Chloride: 104 mEq/L (ref 96–112)
Creatinine, Ser: 0.7 mg/dL (ref 0.40–1.50)
GFR: 99.21 mL/min (ref >60.00)
Glucose, Bld: 95 mg/dL (ref 70–99)
Potassium: 4.2 mEq/L (ref 3.5–5.1)
Sodium: 140 mEq/L (ref 135–145)

## 2021-08-26 LAB — TSH: TSH: 3.63 u[IU]/mL (ref 0.35–5.50)

## 2021-08-26 LAB — PSA: PSA: 2.53 ng/mL (ref 0.10–4.00)

## 2021-08-26 MED ORDER — TAMSULOSIN HCL 0.4 MG PO CAPS: 0.4000 mg | ORAL_CAPSULE | Freq: Every day | ORAL | 3 refills | Status: DC

## 2021-08-26 NOTE — Progress Notes (Signed)
   Subjective:    Patient ID: Brent Gordon, male    DOB: 04/07/1960, 61 y.o.   MRN: [id number]  HPI Here for a well exam. He feels well. He still gets allergy shots monthly.    Review of Systems  Constitutional: Negative.   HENT: Negative.    Eyes: Negative.   Respiratory: Negative.    Cardiovascular: Negative.   Gastrointestinal: Negative.   Genitourinary: Negative.   Musculoskeletal: Negative.   Skin: Negative.   Neurological: Negative.   Psychiatric/Behavioral: Negative.         Objective:   Physical Exam Constitutional:      General: He is not in acute distress.    Appearance: Normal appearance. He is well-developed. He is not diaphoretic.  HENT:     Head: Normocephalic and atraumatic.     Right Ear: External ear normal.     Left Ear: External ear normal.     Nose: Nose normal.     Mouth/Throat:     Pharynx: No oropharyngeal exudate.  Eyes:     General: No scleral icterus.       Right eye: No discharge.        Left eye: No discharge.     Conjunctiva/sclera: Conjunctivae normal.     Pupils: Pupils are equal, round, and reactive to light.  Neck:     Thyroid: No thyromegaly.     Vascular: No JVD.     Trachea: No tracheal deviation.  Cardiovascular:     Rate and Rhythm: Normal rate and regular rhythm.     Heart sounds: Normal heart sounds. No murmur heard.    No friction rub. No gallop.  Pulmonary:     Effort: Pulmonary effort is normal. No respiratory distress.     Breath sounds: Normal breath sounds. No wheezing or rales.  Chest:     Chest wall: No tenderness.  Abdominal:     General: Bowel sounds are normal. There is no distension.     Palpations: Abdomen is soft. There is no mass.     Tenderness: There is no abdominal tenderness. There is no guarding or rebound.  Genitourinary:    Penis: Normal. No tenderness.      Testes: Normal.     Prostate: Normal.     Rectum: Normal. Guaiac result negative.  Musculoskeletal:        General: No tenderness. Normal  range of motion.     Cervical back: Neck supple.  Lymphadenopathy:     Cervical: No cervical adenopathy.  Skin:    General: Skin is warm and dry.     Coloration: Skin is not pale.     Findings: No erythema or rash.  Neurological:     Mental Status: He is alert and oriented to person, place, and time.     Cranial Nerves: No cranial nerve deficit.     Motor: No abnormal muscle tone.     Coordination: Coordination normal.     Deep Tendon Reflexes: Reflexes are normal and symmetric. Reflexes normal.  Psychiatric:        Behavior: Behavior normal.        Thought Content: Thought content normal.        Judgment: Judgment normal.           Assessment & Plan:  Well exam. We discussed diet and exercise. Get fasting labs. Gershon Crane, MD

## 2021-08-27 ENCOUNTER — Ambulatory Visit (INDEPENDENT_AMBULATORY_CARE_PROVIDER_SITE_OTHER): Payer: BC Managed Care – PPO | Admitting: Sports Medicine

## 2021-08-27 VITALS — BP 130/80 | HR 72 | Ht 70.0 in | Wt 266.0 lb

## 2021-08-27 DIAGNOSIS — M75112 Incomplete rotator cuff tear or rupture of left shoulder, not specified as traumatic: Secondary | ICD-10-CM | POA: Diagnosis not present

## 2021-08-27 DIAGNOSIS — S43432A Superior glenoid labrum lesion of left shoulder, initial encounter: Secondary | ICD-10-CM

## 2021-08-27 DIAGNOSIS — M25512 Pain in left shoulder: Secondary | ICD-10-CM

## 2021-08-27 DIAGNOSIS — G8929 Other chronic pain: Secondary | ICD-10-CM

## 2021-08-27 DIAGNOSIS — M19012 Primary osteoarthritis, left shoulder: Secondary | ICD-10-CM | POA: Diagnosis not present

## 2021-08-27 NOTE — Patient Instructions (Addendum)
Good to see you  Ortho referral  As needed follow up  
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
## 2021-08-30 ENCOUNTER — Ambulatory Visit: Payer: BC Managed Care – PPO | Admitting: Sports Medicine

## 2021-08-31 DIAGNOSIS — G4733 Obstructive sleep apnea (adult) (pediatric): Secondary | ICD-10-CM | POA: Diagnosis not present

## 2021-09-03 ENCOUNTER — Ambulatory Visit: Payer: BC Managed Care – PPO

## 2021-09-03 DIAGNOSIS — M25571 Pain in right ankle and joints of right foot: Secondary | ICD-10-CM | POA: Diagnosis not present

## 2021-09-03 DIAGNOSIS — M25612 Stiffness of left shoulder, not elsewhere classified: Secondary | ICD-10-CM

## 2021-09-03 DIAGNOSIS — M25572 Pain in left ankle and joints of left foot: Secondary | ICD-10-CM

## 2021-09-03 DIAGNOSIS — M25562 Pain in left knee: Secondary | ICD-10-CM | POA: Diagnosis not present

## 2021-09-03 DIAGNOSIS — G8929 Other chronic pain: Secondary | ICD-10-CM | POA: Diagnosis not present

## 2021-09-03 DIAGNOSIS — M25512 Pain in left shoulder: Secondary | ICD-10-CM | POA: Diagnosis not present

## 2021-09-03 DIAGNOSIS — M25561 Pain in right knee: Secondary | ICD-10-CM | POA: Diagnosis not present

## 2021-09-03 NOTE — Therapy (Signed)
OUTPATIENT PHYSICAL THERAPY TREATMENT NOTE   Patient Name: Brent Gordon MRN: [id number] DOB:20-Aug-1960, 61 y.o., male Today's Date: 09/03/2021   PT End of Session - 09/03/21 0800     Visit Number 16    Date for PT Re-Evaluation 10/17/21    Authorization Type BCBS    PT Start Time 0728    PT Stop Time 0759    PT Time Calculation (min) 31 min    Activity Tolerance Patient tolerated treatment well    Behavior During Therapy [facility] for tasks assessed/performed                           Past Medical History:  Diagnosis Date   Allergy    sees Dr. Lowella Grip   Obesity    Pain, low back    Sleep apnea    PSG 12/01/06 see's Dr Chesley Mires   Varicose veins    Past Surgical History:  Procedure Laterality Date   COLONOSCOPY  08/01/2019   Dr. Wilford Corner, diverticulosis, internal hemorrhoids, no polyps, repeat in 5 yrs   FOOT SURGERY Left 09/26/2011   triple arthrodesis per Dr. Wylene Simmer   HERNIA REPAIR  2005/2006   Dr. Zella Richer   leg vein ablation     right lower, Dr. Linus Mako   TONSILLECTOMY  01/13/1965   Patient Active Problem List   Diagnosis Date Noted   Concussion 10/16/2020   Knee pain 05/16/2016   OBSTRUCTIVE SLEEP APNEA 10/16/2006   Acute low back pain with radicular symptoms, duration less than 6 weeks 09/24/2006    PCP: Dr. Alysia Penna  REFERRING PROVIDER: Dr. Glennon Mac (pt states he has not been seen by Dr. Tamala Julian as previously documented)  REFERRING DIAG: M25.512, G89.29 chronic left shoulder pain;  M25.561, M25.562, G89.29 Chronic pain of both knees; M25.571 chronic pain of both ankles  THERAPY DIAG: bil knee pain; bil ankle pain; left shoulder pain; weakness   Rationale for Evaluation and Treatment Rehabilitation  ONSET DATE: 01/13/21  SUBJECTIVE:   SUBJECTIVE STATEMENT: I am going to see Dr Tamera Punt for my shoulder to discuss my options. My Lt side hurts.   Acquired flat foot syndrome Left foot surgery 7  screws triple arthrodesis;  left knee meniscus repair 2 years ago Wears off the shelf orthotics  PAIN:  Are you having pain? Yes NPRS scale: 4-5/10 Pain location: Legs and Lt shoulder   Aggravating factors: AM, walk on soft sand Relieving factors: after get warmed up  PRECAUTIONS: None  WEIGHT BEARING RESTRICTIONS No  FALLS:  Has patient fallen in last 6 months? No  LIVING ENVIRONMENT: Lives with: lives with their spouse Lives in: House/apartment Stairs:  OCCUPATION: sitting mostly  PATIENT GOALS strengthen legs and core   OBJECTIVE:   DIAGNOSTIC FINDINGS:  08/2021: Lt shoulder IMPRESSION: 1. Moderate partial-thickness tears of the anterior greater than mid AP dimension of the supraspinatus distal critical zone and tendon footprint. Minimal anterior supraspinatus muscle atrophy. 2. Mild degenerative changes of the acromioclavicular joint. Minimal downsloping of the anterolateral acromion. 3. Severe glenohumeral osteoarthritis. 4. Degenerative chondrolabral junction tears at the posteroinferior through posterosuperior aspects of the glenoid labrum.  PATIENT SURVEYS:  FOTO 71%  (lower extremity) 07/09/21: 63  7/13: 67%   COGNITION:  Overall cognitive status: Within functional limits for tasks assessed    MUSCLE LENGTH: Hamstrings: Right 65 deg; Left 50 deg Thomas test: Right 0 deg; Left 5 deg Decreased bil gastroc lengths 7/13:  Much improved hip flexor lengths to 20 degrees but with pt states more pull on the left  POSTURE:   Right > left pes planus (severe on right)   LOWER EXTREMITY ROM:   Left ankle dorsiflexion 5 degrees; right 10 degrees;  no left ankle/foot inversion and eversion secondary to surgical hardware 7/13: No significant change in right ankle ROM  LOWER EXTREMITY MMT:  MMT Right eval Rt  07/02/21 Left eval Right  07/02/21 7/13  Hip flexion 4+5 4+/5 4+ 4+/5 5  Hip extension 4  4    Hip abduction 3+  4-  Right 4-; left 4+/5   Hip  adduction       Hip internal rotation       Hip external rotation       Knee flexion  4/5  4/5 5  Knee extension 4- 4/5 4 4/5  Right 4; left 4/5  Ankle dorsiflexion 4  4    Ankle plantarflexion 4  4    Ankle inversion       Ankle eversion        (Blank rows = not tested)  Abdominal strength 4-/5  Pelvic drop with quadruped and 6 inch step down test  7/13:  Left pelvic drop and trunk rotation noted with 6 inch step down test;  right ankle ROM too limited for step down test  FUNCTIONAL TESTS:  7/13: SLS left 3 sec, right 3 sec;  able to rise from chair without UE support  Able to go up and down steps reciprocally with light railing support  GAIT: Comments: dec stride length  Left shoulder ROM: (seated) flexion 146, abduction 108, external rotation 40 degrees, internal rotation behind back to L4 Left shoulder MMT: flexion 4/5 with pain, abduction 4/5 with pain; external and internal rotation 4+/5 06/18/21: Lt shoulder flexion 145  7/13: Flexion 145 (164 supine), abduction 115, external rotation 67, internal rotation, L1; less pain with thumb up with elevation;   Shoulder MMT:  flexion 4/5 with pain; abduction 4/5 with pain, external and internal rotation 4+/5  TODAY'S TREATMENT: 09/03/2021: Nu Step L4 5 min while discussing status/progress Seated Rows:  25# 2x10 Hip Matrix:  40# for hip abduction and hip extension x10 bilat Supine shoulder flexion with 2# 2x10 LUE Sidelying shoulder abduction and ER 2# 2x10 LUE each Heel raises: on mini tramp x20 Weight shifting: 3 ways x1 min each cues to decrease UE support Sidestepping with blue band around thighs along mat table x 4 laps   08/20/2021: Nu Step L4 5 min while discussing status/progress Seated Rows:  25# 2x10 Hip Matrix:  40# for hip abduction and hip extension x10 bilat Supine shoulder flexion with 2# 2x10 LUE Sidelying shoulder abduction and ER 2# 2x10 LUE each Heel raises: on mini tramp x20 Weight shifting: 3 ways x1 min  each cues to decrease UE support Sidestepping with blue band around thighs along mat table x 4 laps   08/05/2021: Nu Step L4 5 min while discussing status/progress Seated Rows:  25# 2x10 Hip Matrix:  40# for hip abduction and hip extension x10 bilat Supine shoulder flexion with 2# 2x10 LUE Sidelying shoulder abduction and ER 2# 2x10 LUE each Heel raises: on mini tramp x20 Weight shifting: 3 ways x1 min each cues to decrease UE support    PATIENT EDUCATION:  Education details: Access Code: [email or web address] Person educated: Patient Education method: Explanation, Demonstration, and Handouts Education comprehension: verbalized understanding   HOME EXERCISE PROGRAM: Access Code: [email or web address] URL: [email or web address]  Date: 07/25/2021 Prepared by: Ruben Im  Exercises - Clamshell  - 1 x daily - 7 x weekly - 1 sets - 10 reps - Sidelying Hip Abduction  - 1 x daily - 7 x weekly - 1 sets - 10 reps - Hip Flexor Stretch at Edge of Bed  - 1 x daily - 7 x weekly - 1 sets - 3 reps - 20 hold - Plank on Knees  - 1 x daily - 7 x weekly - 1 sets - 10 reps - Gastroc Stretch on Wall  - 3 x daily - 7 x weekly - 1 sets - 3 reps - 10 hold - Standing Single Leg Stance with Counter Support  - 2 x daily - 7 x weekly - 1 sets - 3 reps - 10 hold - Sit to Stand Without Arm Support  - 2 x daily - 7 x weekly - 2 sets - 10 reps - Seated Hamstring Stretch  - 3 x daily - 7 x weekly - 1 sets - 3 reps - 20 hold - Heel Raises with Counter Support  - 2 x daily - 7 x weekly - 2 sets - 10 reps - Supine Shoulder Flexion with Free Weight (Mirrored)  - 1 x daily - 7 x weekly - 2 sets - 10 reps - Sidelying Shoulder Abduction Full Range of Motion with Dumbbell  - 1 x daily - 7 x weekly - 2 sets - 10 reps   ASSESSMENT:  CLINICAL IMPRESSION: Pt will see orthopedic MD this week to discuss surgical options. Pt is consistent with hip strength exercises at home and is walking regularly. Pt has stopped shoulder  exercises and PT encouraged him to resume this for strength and stability.  Pt did well with all exercises in the clinic with some pain with shoulder exercise and PT monitored for technique throughout.  Pt continues to require skilled PT to progress towards goal related activities.    OBJECTIVE IMPAIRMENTS decreased activity tolerance, difficulty walking, decreased ROM, decreased strength, impaired flexibility, and pain.   ACTIVITY LIMITATIONS shopping, community activity, occupation, and yard work.   PERSONAL FACTORS Past/current experiences and Time since onset of injury/illness/exacerbation are also affecting patient's functional outcome.    REHAB POTENTIAL: Good  CLINICAL DECISION MAKING: Stable/uncomplicated  EVALUATION COMPLEXITY: Low   GOALS: Goals reviewed with patient? Yes  SHORT TERM GOALS: Target date: 06/28/21   The patient will demonstrate knowledge of basic self care strategies and exercises to promote healing   Baseline: Goal status: MET  2.  The patient will have improved knee and hip strength to at least 4/5 needed for standing, walking longer distances and descending stairs at home and in the community  Baseline:  Goal status: In progress  3.  The patient will report a 40% improvement in pain levels with functional activities which are currently difficult including mobility in the mornings Baseline: able to do most activity, 20-25% reduced pain (07/23/21) Goal status: Goal Met 08/05/21  4.  The patient will have improved shoulder elevation ROM to at least 160 degrees needed for grooming/dressing purposes as well as reaching high shelves  Baseline: 145 degrees (06/18/21) Goal status: in progress   LONG TERM GOALS: Target date: 10/17/21   The patient will be independent in a safe self progression of a home exercise program to promote further recovery of function   Baseline:  Goal status: In progress   2.  The patient will have bil hip abduction strength to  grossly  4+/5 needed for heavier duty yard work  Baseline:  Goal status: in progress  3.  The patient will have improved LE strength and proprioception needed to single leg stand 8-10 sec  Baseline:  Goal status: INITIAL  4.  The patient will have improved quad strength to 4+/5 descend steps without medial knee collapse and trunk lean  Baseline:improved stability (07/09/21) Goal status: In progress  5.  The patient will have improved FOTO score to 74% indicating improved function with less pain  Baseline: 63 Goal status: In progress    PLAN: PT FREQUENCY: 1x/week to biweekly  PT DURATION: 12 weeks  PLANNED INTERVENTIONS: Therapeutic exercises, Therapeutic activity, Neuromuscular re-education, Balance training, Gait training, Patient/Family education, Joint mobilization, Aquatic Therapy, Dry Needling, Taping, Ultrasound, Ionotophoresis 74m/ml Dexamethasone, and Manual therapy                                           PLAN FOR NEXT SESSION: weekly to biweekly tapering over 12 weeks; work on bil glute medius strengthening;  left quad strengthening; supine and sidelying left shoulder strengthening with light weight,see what Dr CTamera Puntsays regarding shoulder options.  KSigurd Sos PT 09/03/21 8:00 AM    [facility] [address] [place] [address]Phone # [phone] [phone]

## 2021-09-09 DIAGNOSIS — M19212 Secondary osteoarthritis, left shoulder: Secondary | ICD-10-CM | POA: Diagnosis not present

## 2021-09-17 DIAGNOSIS — J3089 Other allergic rhinitis: Secondary | ICD-10-CM | POA: Diagnosis not present

## 2021-09-17 DIAGNOSIS — J301 Allergic rhinitis due to pollen: Secondary | ICD-10-CM | POA: Diagnosis not present

## 2021-09-17 DIAGNOSIS — J3081 Allergic rhinitis due to animal (cat) (dog) hair and dander: Secondary | ICD-10-CM | POA: Diagnosis not present

## 2021-09-19 ENCOUNTER — Encounter: Payer: Self-pay | Admitting: Rehabilitative and Restorative Service Providers"

## 2021-09-19 ENCOUNTER — Ambulatory Visit: Payer: BC Managed Care – PPO | Attending: Family Medicine | Admitting: Rehabilitative and Restorative Service Providers"

## 2021-09-19 DIAGNOSIS — M25571 Pain in right ankle and joints of right foot: Secondary | ICD-10-CM | POA: Diagnosis not present

## 2021-09-19 DIAGNOSIS — M25561 Pain in right knee: Secondary | ICD-10-CM | POA: Insufficient documentation

## 2021-09-19 DIAGNOSIS — M25512 Pain in left shoulder: Secondary | ICD-10-CM | POA: Diagnosis not present

## 2021-09-19 DIAGNOSIS — G8929 Other chronic pain: Secondary | ICD-10-CM | POA: Insufficient documentation

## 2021-09-19 DIAGNOSIS — M25562 Pain in left knee: Secondary | ICD-10-CM | POA: Diagnosis not present

## 2021-09-19 NOTE — Therapy (Signed)
OUTPATIENT PHYSICAL THERAPY TREATMENT NOTE   Patient Name: Brent Gordon MRN: [id number] DOB:[birth year], 61 y.o., male Today's Date: 09/19/2021   PT End of Session - 09/19/21 0732     Visit Number 17    Date for PT Re-Evaluation 10/17/21    Authorization Type BCBS    PT Start Time 0730    PT Stop Time 0800    PT Time Calculation (min) 30 min    Activity Tolerance Patient tolerated treatment well    Behavior During Therapy [facility] for tasks assessed/performed                           Past Medical History:  Diagnosis Date   Allergy    sees Dr. Lowella Grip   Obesity    Pain, low back    Sleep apnea    PSG 12/01/06 see's Dr Chesley Mires   Varicose veins    Past Surgical History:  Procedure Laterality Date   COLONOSCOPY  08/01/2019   Dr. Wilford Corner, diverticulosis, internal hemorrhoids, no polyps, repeat in 5 yrs   FOOT SURGERY Left 09/26/2011   triple arthrodesis per Dr. Wylene Simmer   HERNIA REPAIR  2005/2006   Dr. Zella Richer   leg vein ablation     right lower, Dr. Linus Mako   TONSILLECTOMY  01/13/1965   Patient Active Problem List   Diagnosis Date Noted   Concussion 10/16/2020   Knee pain 05/16/2016   OBSTRUCTIVE SLEEP APNEA 10/16/2006   Acute low back pain with radicular symptoms, duration less than 6 weeks 09/24/2006    PCP: Dr. Alysia Penna  REFERRING PROVIDER: Dr. Glennon Mac (pt states he has not been seen by Dr. Tamala Julian as previously documented)  REFERRING DIAG: M25.512, G89.29 chronic left shoulder pain;  M25.561, M25.562, G89.29 Chronic pain of both knees; M25.571 chronic pain of both ankles  THERAPY DIAG: bil knee pain; bil ankle pain; left shoulder pain; weakness   Rationale for Evaluation and Treatment Rehabilitation  ONSET DATE: 01/13/21  SUBJECTIVE:   SUBJECTIVE STATEMENT: Pt reports that he saw Dr Tamera Punt and that he said that he wasn't going to get surgery now, but if he did need it, he would be a candidate  for reverse shoulder replacement.  Pt states that he does not want to pursue surgery at this point.   Acquired flat foot syndrome Left foot surgery 7 screws triple arthrodesis;  left knee meniscus repair 2 years ago Wears off the shelf orthotics  PAIN:  Are you having pain? Yes NPRS scale: 4-5/10 Pain location: Legs and Lt shoulder   Aggravating factors: AM, walk on soft sand Relieving factors: after get warmed up  PRECAUTIONS: None  WEIGHT BEARING RESTRICTIONS No  FALLS:  Has patient fallen in last 6 months? No  LIVING ENVIRONMENT: Lives with: lives with their spouse Lives in: House/apartment Stairs:  OCCUPATION: sitting mostly  PATIENT GOALS strengthen legs and core   OBJECTIVE:   DIAGNOSTIC FINDINGS:  08/2021: Lt shoulder IMPRESSION: 1. Moderate partial-thickness tears of the anterior greater than mid AP dimension of the supraspinatus distal critical zone and tendon footprint. Minimal anterior supraspinatus muscle atrophy. 2. Mild degenerative changes of the acromioclavicular joint. Minimal downsloping of the anterolateral acromion. 3. Severe glenohumeral osteoarthritis. 4. Degenerative chondrolabral junction tears at the posteroinferior through posterosuperior aspects of the glenoid labrum.  PATIENT SURVEYS:  FOTO 71%  (lower extremity) 07/09/21: 63  7/13: 67%   COGNITION:  Overall cognitive status: Within functional  limits for tasks assessed    MUSCLE LENGTH: Hamstrings: Right 65 deg; Left 50 deg Thomas test: Right 0 deg; Left 5 deg Decreased bil gastroc lengths 7/13:  Much improved hip flexor lengths to 20 degrees but with pt states more pull on the left  POSTURE:   Right > left pes planus (severe on right)   LOWER EXTREMITY ROM:   Left ankle dorsiflexion 5 degrees; right 10 degrees;  no left ankle/foot inversion and eversion secondary to surgical hardware 7/13: No significant change in right ankle ROM  LOWER EXTREMITY MMT:  MMT Right eval Rt   07/02/21 Left eval Right  07/02/21 7/13  Hip flexion 4+5 4+/5 4+ 4+/5 5  Hip extension 4  4    Hip abduction 3+  4-  Right 4-; left 4+/5   Hip adduction       Hip internal rotation       Hip external rotation       Knee flexion  4/5  4/5 5  Knee extension 4- 4/5 4 4/5  Right 4; left 4/5  Ankle dorsiflexion 4  4    Ankle plantarflexion 4  4    Ankle inversion       Ankle eversion        (Blank rows = not tested)  Abdominal strength 4-/5  Pelvic drop with quadruped and 6 inch step down test  7/13:  Left pelvic drop and trunk rotation noted with 6 inch step down test;  right ankle ROM too limited for step down test  FUNCTIONAL TESTS:  7/13: SLS left 3 sec, right 3 sec;  able to rise from chair without UE support  Able to go up and down steps reciprocally with light railing support  GAIT: Comments: dec stride length  Left shoulder ROM: (seated) flexion 146, abduction 108, external rotation 40 degrees, internal rotation behind back to L4 Left shoulder MMT: flexion 4/5 with pain, abduction 4/5 with pain; external and internal rotation 4+/5 06/18/21: Lt shoulder flexion 145  7/13: Flexion 145 (164 supine), abduction 115, external rotation 67, internal rotation, L1; less pain with thumb up with elevation;   Shoulder MMT:  flexion 4/5 with pain; abduction 4/5 with pain, external and internal rotation 4+/5  TODAY'S TREATMENT: 09/19/2021: Nu Step L4 5 min while discussing status/progress Seated Rows:  25# 2x10 Hip Matrix:  40# for hip abduction and hip extension x10 bilat Supine shoulder flexion with 2# 2x10 LUE Sidelying shoulder abduction and ER 2# 2x10 LUE each Heel raises: on mini tramp x20 Weight shifting on trampoline: 3 ways x1 min each cues to decrease UE support Kickstand stance trampoline rebounder with red ball x20 tosses with each LE as stance leg Sidestepping with blue band around thighs along mat table x 4 laps   09/03/2021: Nu Step L4 5 min while discussing  status/progress Seated Rows:  25# 2x10 Hip Matrix:  40# for hip abduction and hip extension x10 bilat Supine shoulder flexion with 2# 2x10 LUE Sidelying shoulder abduction and ER 2# 2x10 LUE each Heel raises: on mini tramp x20 Weight shifting: 3 ways x1 min each cues to decrease UE support Sidestepping with blue band around thighs along mat table x 4 laps   08/20/2021: Nu Step L4 5 min while discussing status/progress Seated Rows:  25# 2x10 Hip Matrix:  40# for hip abduction and hip extension x10 bilat Supine shoulder flexion with 2# 2x10 LUE Sidelying shoulder abduction and ER 2# 2x10 LUE each Heel raises: on mini tramp   x20 Weight shifting: 3 ways x1 min each cues to decrease UE support Sidestepping with blue band around thighs along mat table x 4 laps      PATIENT EDUCATION:  Education details: Access Code: [email or web address] Person educated: Patient Education method: Explanation, Demonstration, and Handouts Education comprehension: verbalized understanding   HOME EXERCISE PROGRAM: Access Code: [email or web address] URL: [email or web address] Date: 07/25/2021 Prepared by: Stacy [facility]  Exercises - Clamshell  - 1 x daily - 7 x weekly - 1 sets - 10 reps - Sidelying Hip Abduction  - 1 x daily - 7 x weekly - 1 sets - 10 reps - Hip Flexor Stretch at Edge of Bed  - 1 x daily - 7 x weekly - 1 sets - 3 reps - 20 hold - Plank on Knees  - 1 x daily - 7 x weekly - 1 sets - 10 reps - Gastroc Stretch on Wall  - 3 x daily - 7 x weekly - 1 sets - 3 reps - 10 hold - Standing Single Leg Stance with Counter Support  - 2 x daily - 7 x weekly - 1 sets - 3 reps - 10 hold - Sit to Stand Without Arm Support  - 2 x daily - 7 x weekly - 2 sets - 10 reps - Seated Hamstring Stretch  - 3 x daily - 7 x weekly - 1 sets - 3 reps - 20 hold - Heel Raises with Counter Support  - 2 x daily - 7 x weekly - 2 sets - 10 reps - Supine Shoulder Flexion with Free Weight (Mirrored)  - 1 x daily - 7 x weekly - 2 sets - 10  reps - Sidelying Shoulder Abduction Full Range of Motion with Dumbbell  - 1 x daily - 7 x weekly - 2 sets - 10 reps   ASSESSMENT:  CLINICAL IMPRESSION: Pt presents to skilled PT with continued reports of shoulder pain being worse than legs.  He states that he was told by Dr Chandler that he would be a candidate for a reverse shoulder replacement, but he does not feel that he wants to have that surgery at this time.  Pt continues to progress with therapy and was provided with a blue band for hip strengthening exercises at home.  Pt able to progress with standing balance with trampoline rebounder exercise today.  Pt continues to require skilled PT to progress towards goal related activities.   OBJECTIVE IMPAIRMENTS decreased activity tolerance, difficulty walking, decreased ROM, decreased strength, impaired flexibility, and pain.   ACTIVITY LIMITATIONS shopping, community activity, occupation, and yard work.   PERSONAL FACTORS Past/current experiences and Time since onset of injury/illness/exacerbation are also affecting patient's functional outcome.    REHAB POTENTIAL: Good  CLINICAL DECISION MAKING: Stable/uncomplicated  EVALUATION COMPLEXITY: Low   GOALS: Goals reviewed with patient? Yes  SHORT TERM GOALS: Target date: 06/28/21   The patient will demonstrate knowledge of basic self care strategies and exercises to promote healing   Baseline: Goal status: MET  2.  The patient will have improved knee and hip strength to at least 4/5 needed for standing, walking longer distances and descending stairs at home and in the community  Baseline:  Goal status: In progress  3.  The patient will report a 40% improvement in pain levels with functional activities which are currently difficult including mobility in the mornings Baseline: able to do most activity, 20-25% reduced pain (07/23/21) Goal status: Goal Met 08/05/21  4.  The patient will have   improved shoulder elevation ROM to at  least 160 degrees needed for grooming/dressing purposes as well as reaching high shelves  Baseline: 145 degrees (06/18/21) Goal status: in progress   LONG TERM GOALS: Target date: 10/17/21   The patient will be independent in a safe self progression of a home exercise program to promote further recovery of function   Baseline:  Goal status: In progress   2.  The patient will have bil hip abduction strength to grossly 4+/5 needed for heavier duty yard work  Baseline:  Goal status: in progress  3.  The patient will have improved LE strength and proprioception needed to single leg stand 8-10 sec  Baseline:  Goal status: INITIAL  4.  The patient will have improved quad strength to 4+/5 descend steps without medial knee collapse and trunk lean  Baseline:improved stability (07/09/21) Goal status: In progress  5.  The patient will have improved FOTO score to 74% indicating improved function with less pain  Baseline: 63 Goal status: In progress    PLAN: PT FREQUENCY: 1x/week to biweekly  PT DURATION: 12 weeks  PLANNED INTERVENTIONS: Therapeutic exercises, Therapeutic activity, Neuromuscular re-education, Balance training, Gait training, Patient/Family education, Joint mobilization, Aquatic Therapy, Dry Needling, Taping, Ultrasound, Ionotophoresis 4mg/ml Dexamethasone, and Manual therapy                                           PLAN FOR NEXT SESSION: Remeasure A/ROM and FOTO, weekly to biweekly tapering over 12 weeks; work on bil glute medius strengthening;  left quad strengthening; supine and sidelying left shoulder strengthening with light weight,see what Dr Chandler says regarding shoulder options.   Shaunda Menke, PT 09/19/21 8:03 AM    [facility] [address], [address] [place], [place] [address] Phone # [phone] Fax [phone]  

## 2021-09-30 DIAGNOSIS — G4733 Obstructive sleep apnea (adult) (pediatric): Secondary | ICD-10-CM | POA: Diagnosis not present

## 2021-10-01 ENCOUNTER — Ambulatory Visit: Payer: BC Managed Care – PPO

## 2021-10-01 DIAGNOSIS — M25562 Pain in left knee: Secondary | ICD-10-CM | POA: Diagnosis not present

## 2021-10-01 DIAGNOSIS — M25571 Pain in right ankle and joints of right foot: Secondary | ICD-10-CM

## 2021-10-01 DIAGNOSIS — G8929 Other chronic pain: Secondary | ICD-10-CM | POA: Diagnosis not present

## 2021-10-01 DIAGNOSIS — M25512 Pain in left shoulder: Secondary | ICD-10-CM | POA: Diagnosis not present

## 2021-10-01 DIAGNOSIS — M25561 Pain in right knee: Secondary | ICD-10-CM | POA: Diagnosis not present

## 2021-10-01 NOTE — Therapy (Signed)
OUTPATIENT PHYSICAL THERAPY TREATMENT NOTE   Patient Name: Brent Gordon MRN: [id number] DOB:11-13-1960, 61 y.o., male Today's Date: 10/01/2021   PT End of Session - 10/01/21 0727     Visit Number 18    Date for PT Re-Evaluation 10/17/21    Authorization Type [employer]    PT Start Time 0728    PT Stop Time 0800    PT Time Calculation (min) 32 min    Activity Tolerance Patient tolerated treatment well    Behavior During Therapy [facility] for tasks assessed/performed                            Past Medical History:  Diagnosis Date   Allergy    sees Dr. Lowella Grip   Obesity    Pain, low back    Sleep apnea    PSG 12/01/06 see's Dr Chesley Mires   Varicose veins    Past Surgical History:  Procedure Laterality Date   COLONOSCOPY  08/01/2019   Dr. Wilford Corner, diverticulosis, internal hemorrhoids, no polyps, repeat in 5 yrs   FOOT SURGERY Left 09/26/2011   triple arthrodesis per Dr. Wylene Simmer   HERNIA REPAIR  2005/2006   Dr. Zella Richer   leg vein ablation     right lower, Dr. Linus Mako   TONSILLECTOMY  01/13/1965   Patient Active Problem List   Diagnosis Date Noted   Concussion 10/16/2020   Knee pain 05/16/2016   OBSTRUCTIVE SLEEP APNEA 10/16/2006   Acute low back pain with radicular symptoms, duration less than 6 weeks 09/24/2006    PCP: Dr. Alysia Penna  REFERRING PROVIDER: Dr. Glennon Mac (pt states he has not been seen by Dr. Tamala Julian as previously documented)  REFERRING DIAG: M25.512, G89.29 chronic left shoulder pain;  M25.561, M25.562, G89.29 Chronic pain of both knees; M25.571 chronic pain of both ankles  THERAPY DIAG: bil knee pain; bil ankle pain; left shoulder pain; weakness   Rationale for Evaluation and Treatment Rehabilitation  ONSET DATE: 01/13/21  SUBJECTIVE:   SUBJECTIVE STATEMENT: Pt reports that he saw Dr Tamera Punt and that he said that he wasn't going to get surgery now, but if he did need it, he would be a candidate  for reverse shoulder replacement.  Pt states that he does not want to pursue surgery at this point.   Acquired flat foot syndrome Left foot surgery 7 screws triple arthrodesis;  left knee meniscus repair 2 years ago Wears off the shelf orthotics  PAIN:  Are you having pain? Yes NPRS scale: 6-7/10 Pain location: Lt ankle & Lt shoulder  Aggravating factors: AM, walk on soft sand Relieving factors: after get warmed up  PRECAUTIONS: None  WEIGHT BEARING RESTRICTIONS No  FALLS:  Has patient fallen in last 6 months? No  LIVING ENVIRONMENT: Lives with: lives with their spouse Lives in: House/apartment Stairs:  OCCUPATION: sitting mostly  PATIENT GOALS strengthen legs and core   OBJECTIVE:   DIAGNOSTIC FINDINGS:  08/2021: Lt shoulder IMPRESSION: 1. Moderate partial-thickness tears of the anterior greater than mid AP dimension of the supraspinatus distal critical zone and tendon footprint. Minimal anterior supraspinatus muscle atrophy. 2. Mild degenerative changes of the acromioclavicular joint. Minimal downsloping of the anterolateral acromion. 3. Severe glenohumeral osteoarthritis. 4. Degenerative chondrolabral junction tears at the posteroinferior through posterosuperior aspects of the glenoid labrum.  PATIENT SURVEYS:  FOTO 71%  (lower extremity) 07/09/21: 63  7/13: 67% 10/01/21: 59  COGNITION:  Overall cognitive status:  Within functional limits for tasks assessed    MUSCLE LENGTH: Hamstrings: Right 65 deg; Left 50 deg Thomas test: Right 0 deg; Left 5 deg Decreased bil gastroc lengths 7/13:  Much improved hip flexor lengths to 20 degrees but with pt states more pull on the left  POSTURE:   Right > left pes planus (severe on right)   LOWER EXTREMITY ROM:   Left ankle dorsiflexion 5 degrees; right 10 degrees;  no left ankle/foot inversion and eversion secondary to surgical hardware 7/13: No significant change in right ankle ROM  LOWER EXTREMITY MMT:  MMT  Right eval Rt  07/02/21 Left eval Right  07/02/21 7/13  Hip flexion 4+5 4+/5 4+ 4+/5 5  Hip extension 4  4    Hip abduction 3+  4-  Right 4-; left 4+/5   Hip adduction       Hip internal rotation       Hip external rotation       Knee flexion  4/5  4/5 5  Knee extension 4- 4/5 4 4/5  Right 4; left 4/5  Ankle dorsiflexion 4  4    Ankle plantarflexion 4  4    Ankle inversion       Ankle eversion        (Blank rows = not tested)  Abdominal strength 4-/5  Pelvic drop with quadruped and 6 inch step down test  7/13:  Left pelvic drop and trunk rotation noted with 6 inch step down test;  right ankle ROM too limited for step down test  FUNCTIONAL TESTS:  7/13: SLS left 3 sec, right 3 sec;  able to rise from chair without UE support  Able to go up and down steps reciprocally with light railing support  GAIT: Comments: dec stride length  Left shoulder ROM: (seated) flexion 146, abduction 108, external rotation 40 degrees, internal rotation behind back to L4 Left shoulder MMT: flexion 4/5 with pain, abduction 4/5 with pain; external and internal rotation 4+/5 06/18/21: Lt shoulder flexion 145  7/13: Flexion 145 (164 supine), abduction 115, external rotation 67, internal rotation, L1; less pain with thumb up with elevation;   Shoulder MMT:  flexion 4/5 with pain; abduction 4/5 with pain, external and internal rotation 4+/5  TODAY'S TREATMENT:  10/01/2021: Nu Step L4 6 min while discussing status/progress Seated Rows:  25# 2x10 Hip Matrix:  40# for hip abduction and hip extension x10 bilat Supine shoulder flexion with 2# 2x10 LUE Sidelying shoulder abduction and ER 2# 2x10 LUE each Heel raises: on mini tramp x20 Weight shifting on trampoline: 3 ways x1 min each cues to decrease UE support Kickstand stance trampoline rebounder with red ball x20 tosses with each LE as stance leg Sidestepping with blue band around thighs along mat table x 4 laps  09/19/2021: Nu Step L4 5 min while  discussing status/progress Seated Rows:  25# 2x10 Hip Matrix:  40# for hip abduction and hip extension x10 bilat Supine shoulder flexion with 2# 2x10 LUE Sidelying shoulder abduction and ER 2# 2x10 LUE each Heel raises: on mini tramp x20 Weight shifting on trampoline: 3 ways x1 min each cues to decrease UE support Kickstand stance trampoline rebounder with red ball x20 tosses with each LE as stance leg Sidestepping with blue band around thighs along mat table x 4 laps   09/03/2021: Nu Step L4 5 min while discussing status/progress Seated Rows:  25# 2x10 Hip Matrix:  40# for hip abduction and hip extension x10 bilat Supine shoulder  flexion with 2# 2x10 LUE Sidelying shoulder abduction and ER 2# 2x10 LUE each Heel raises: on mini tramp x20 Weight shifting: 3 ways x1 min each cues to decrease UE support Sidestepping with blue band around thighs along mat table x 4 laps    PATIENT EDUCATION:  Education details: Access Code: [email or web address] Person educated: Patient Education method: Explanation, Media planner, and Handouts Education comprehension: verbalized understanding   HOME EXERCISE PROGRAM: Access Code: [email or web address] URL: [email or web address] Date: 07/25/2021 Prepared by: Ruben Im  Exercises - Clamshell  - 1 x daily - 7 x weekly - 1 sets - 10 reps - Sidelying Hip Abduction  - 1 x daily - 7 x weekly - 1 sets - 10 reps - Hip Flexor Stretch at Edge of Bed  - 1 x daily - 7 x weekly - 1 sets - 3 reps - 20 hold - Plank on Knees  - 1 x daily - 7 x weekly - 1 sets - 10 reps - Gastroc Stretch on Wall  - 3 x daily - 7 x weekly - 1 sets - 3 reps - 10 hold - Standing Single Leg Stance with Counter Support  - 2 x daily - 7 x weekly - 1 sets - 3 reps - 10 hold - Sit to Stand Without Arm Support  - 2 x daily - 7 x weekly - 2 sets - 10 reps - Seated Hamstring Stretch  - 3 x daily - 7 x weekly - 1 sets - 3 reps - 20 hold - Heel Raises with Counter Support  - 2 x daily - 7 x weekly  - 2 sets - 10 reps - Supine Shoulder Flexion with Free Weight (Mirrored)  - 1 x daily - 7 x weekly - 2 sets - 10 reps - Sidelying Shoulder Abduction Full Range of Motion with Dumbbell  - 1 x daily - 7 x weekly - 2 sets - 10 reps   ASSESSMENT:  CLINICAL IMPRESSION: Pt reports widespread pain in the mornings and with weather changes. Pt is compliant with HEP for flexibility and strength as able.  Pt had pain with seated rows in the Lt glenohumeral joint that resolved after the activity was complete. FOTO has declined for LEs today.  Sessions are biweekly to improve strength and stability to support significant OA in ankles and Lt>Rt shoulder.   Pt continues to require skilled PT to progress towards goal related activities.   OBJECTIVE IMPAIRMENTS decreased activity tolerance, difficulty walking, decreased ROM, decreased strength, impaired flexibility, and pain.   ACTIVITY LIMITATIONS shopping, community activity, occupation, and yard work.   PERSONAL FACTORS Past/current experiences and Time since onset of injury/illness/exacerbation are also affecting patient's functional outcome.    REHAB POTENTIAL: Good  CLINICAL DECISION MAKING: Stable/uncomplicated  EVALUATION COMPLEXITY: Low   GOALS: Goals reviewed with patient? Yes  SHORT TERM GOALS: Target date: 06/28/21   The patient will demonstrate knowledge of basic self care strategies and exercises to promote healing   Baseline: Goal status: MET  2.  The patient will have improved knee and hip strength to at least 4/5 needed for standing, walking longer distances and descending stairs at home and in the community  Baseline:  Goal status: In progress  3.  The patient will report a 40% improvement in pain levels with functional activities which are currently difficult including mobility in the mornings Baseline: able to do most activity, 20-25% reduced pain (07/23/21) Goal status: Goal Met 08/05/21  4.  The patient will have improved  shoulder elevation ROM to at least 160 degrees needed for grooming/dressing purposes as well as reaching high shelves  Baseline: 145 degrees (06/18/21) Goal status: in progress   LONG TERM GOALS: Target date: 10/17/21   The patient will be independent in a safe self progression of a home exercise program to promote further recovery of function   Baseline:  Goal status: In progress   2.  The patient will have bil hip abduction strength to grossly 4+/5 needed for heavier duty yard work  Baseline:  Goal status: in progress  3.  The patient will have improved LE strength and proprioception needed to single leg stand 8-10 sec  Baseline:  Goal status: INITIAL  4.  The patient will have improved quad strength to 4+/5 descend steps without medial knee collapse and trunk lean  Baseline:improved stability (07/09/21) Goal status: In progress  5.  The patient will have improved FOTO score to 74% indicating improved function with less pain  Baseline: 59 (declined on 10/01/21) Goal status: Deferred   PLAN: PT FREQUENCY: 1x/week to biweekly  PT DURATION: 12 weeks  PLANNED INTERVENTIONS: Therapeutic exercises, Therapeutic activity, Neuromuscular re-education, Balance training, Gait training, Patient/Family education, Joint mobilization, Aquatic Therapy, Dry Needling, Taping, Ultrasound, Ionotophoresis 22m/ml Dexamethasone, and Manual therapy                                           PLAN FOR NEXT SESSION: D/C next session.   KSigurd Sos PT 10/01/21 8:02 AM    [place] [place] [address]Phone # [phone] [phone]

## 2021-10-02 DIAGNOSIS — G4733 Obstructive sleep apnea (adult) (pediatric): Secondary | ICD-10-CM | POA: Diagnosis not present

## 2021-10-15 ENCOUNTER — Ambulatory Visit: Payer: BC Managed Care – PPO | Attending: Family Medicine

## 2021-10-15 DIAGNOSIS — M25612 Stiffness of left shoulder, not elsewhere classified: Secondary | ICD-10-CM | POA: Diagnosis not present

## 2021-10-15 DIAGNOSIS — M25571 Pain in right ankle and joints of right foot: Secondary | ICD-10-CM | POA: Diagnosis not present

## 2021-10-15 DIAGNOSIS — M25562 Pain in left knee: Secondary | ICD-10-CM | POA: Diagnosis not present

## 2021-10-15 DIAGNOSIS — G8929 Other chronic pain: Secondary | ICD-10-CM | POA: Insufficient documentation

## 2021-10-15 DIAGNOSIS — M25561 Pain in right knee: Secondary | ICD-10-CM | POA: Diagnosis not present

## 2021-10-15 DIAGNOSIS — M25512 Pain in left shoulder: Secondary | ICD-10-CM | POA: Insufficient documentation

## 2021-10-15 DIAGNOSIS — J3089 Other allergic rhinitis: Secondary | ICD-10-CM | POA: Diagnosis not present

## 2021-10-15 DIAGNOSIS — J3081 Allergic rhinitis due to animal (cat) (dog) hair and dander: Secondary | ICD-10-CM | POA: Diagnosis not present

## 2021-10-15 DIAGNOSIS — J301 Allergic rhinitis due to pollen: Secondary | ICD-10-CM | POA: Diagnosis not present

## 2021-10-15 NOTE — Therapy (Signed)
OUTPATIENT PHYSICAL THERAPY TREATMENT NOTE   Patient Name: Brent Gordon MRN: [id number] DOB:12-Apr-1960, 61 y.o., male Today's Date: 10/15/2021   PT End of Session - 10/15/21 0745     Visit Number 79    PT Start Time 0730    PT Stop Time 0802    PT Time Calculation (min) 32 min    Activity Tolerance Patient tolerated treatment well    Behavior During Therapy [facility] for tasks assessed/performed                             Past Medical History:  Diagnosis Date   Allergy    sees Dr. Lowella Grip   Obesity    Pain, low back    Sleep apnea    PSG 12/01/06 see's Dr Chesley Mires   Varicose veins    Past Surgical History:  Procedure Laterality Date   COLONOSCOPY  08/01/2019   Dr. Wilford Corner, diverticulosis, internal hemorrhoids, no polyps, repeat in 5 yrs   FOOT SURGERY Left 09/26/2011   triple arthrodesis per Dr. Wylene Simmer   HERNIA REPAIR  2005/2006   Dr. Zella Richer   leg vein ablation     right lower, Dr. Linus Mako   TONSILLECTOMY  01/13/1965   Patient Active Problem List   Diagnosis Date Noted   Concussion 10/16/2020   Knee pain 05/16/2016   OBSTRUCTIVE SLEEP APNEA 10/16/2006   Acute low back pain with radicular symptoms, duration less than 6 weeks 09/24/2006    PCP: Dr. Alysia Penna  REFERRING PROVIDER: Dr. Glennon Mac (pt states he has not been seen by Dr. Tamala Julian as previously documented)  REFERRING DIAG: M25.512, G89.29 chronic left shoulder pain;  M25.561, M25.562, G89.29 Chronic pain of both knees; M25.571 chronic pain of both ankles  THERAPY DIAG: bil knee pain; bil ankle pain; left shoulder pain; weakness   Rationale for Evaluation and Treatment Rehabilitation  ONSET DATE: 01/13/21  SUBJECTIVE:   SUBJECTIVE STATEMENT: I will continue with my exercises and will have Lt shoulder replacement when ready.     Acquired flat foot syndrome Left foot surgery 7 screws triple arthrodesis;  left knee meniscus repair 2 years  ago Wears off the shelf orthotics  PAIN:  Are you having pain? Yes NPRS scale: 5/10 Pain location: Lt ankle & Lt shoulder  Aggravating factors: AM, walk on soft sand Relieving factors: after get warmed up  PRECAUTIONS: None  WEIGHT BEARING RESTRICTIONS No  FALLS:  Has patient fallen in last 6 months? No  LIVING ENVIRONMENT: Lives with: lives with their spouse Lives in: House/apartment Stairs:  OCCUPATION: sitting mostly  PATIENT GOALS strengthen legs and core   OBJECTIVE:   DIAGNOSTIC FINDINGS:  08/2021: Lt shoulder IMPRESSION: 1. Moderate partial-thickness tears of the anterior greater than mid AP dimension of the supraspinatus distal critical zone and tendon footprint. Minimal anterior supraspinatus muscle atrophy. 2. Mild degenerative changes of the acromioclavicular joint. Minimal downsloping of the anterolateral acromion. 3. Severe glenohumeral osteoarthritis. 4. Degenerative chondrolabral junction tears at the posteroinferior through posterosuperior aspects of the glenoid labrum.  PATIENT SURVEYS:  FOTO 71%  (lower extremity) 07/09/21: 63  7/13: 67% 10/01/21: 59  COGNITION:  Overall cognitive status: Within functional limits for tasks assessed    MUSCLE LENGTH: Hamstrings: Right 65 deg; Left 50 deg Thomas test: Right 0 deg; Left 5 deg Decreased bil gastroc lengths 7/13:  Much improved hip flexor lengths to 20 degrees but with pt states more  pull on the left  POSTURE:   Right > left pes planus (severe on right)   LOWER EXTREMITY ROM:   Left ankle dorsiflexion 5 degrees; right 10 degrees;  no left ankle/foot inversion and eversion secondary to surgical hardware 7/13: No significant change in right ankle ROM  LOWER EXTREMITY MMT:  MMT Right eval Rt  07/02/21 Left eval Right  07/02/21 7/13 10/15/21   Hip flexion 4+5 4+/5 4+ 4+/5 5   Hip extension 4  4     Hip abduction 3+  4-  Right 4-; left 4+/5  Rt 4/5, Lt 4+/5  Hip adduction        Hip  internal rotation        Hip external rotation        Knee flexion  4/5  4/5 5 Rt and Lt 4+/5   Knee extension 4- 4/5 4 4/5  Right 4; left 4/5 Rt and Lt 5/5  Ankle dorsiflexion 4  4     Ankle plantarflexion 4  4     Ankle inversion        Ankle eversion         (Blank rows = not tested)  Abdominal strength 4-/5  Pelvic drop with quadruped and 6 inch step down test  7/13:  Left pelvic drop and trunk rotation noted with 6 inch step down test;  right ankle ROM too limited for step down test  FUNCTIONAL TESTS:  7/13: SLS left 3 sec, right 3 sec;  able to rise from chair without UE support  Able to go up and down steps reciprocally with light railing support  GAIT: Comments: dec stride length  Left shoulder ROM: (seated) flexion 146, abduction 108, external rotation 40 degrees, internal rotation behind back to L4 Left shoulder MMT: flexion 4/5 with pain, abduction 4/5 with pain; external and internal rotation 4+/5 06/18/21: Lt shoulder flexion 145  7/13: Flexion 145 (164 supine), abduction 115, external rotation 67, internal rotation, L1; less pain with thumb up with elevation;   Shoulder MMT:  flexion 4/5 with pain; abduction 4/5 with pain, external and internal rotation 4+/5 10/15/21: Lt shoulder:   TODAY'S TREATMENT:  10/15/2021: Nu Step L4 6 min while discussing status/progress Seated Rows:  25# 2x10 Hip Matrix:  40# for hip abduction and hip extension x10 bilat Supine shoulder flexion with 2# 2x10 LUE Sidelying shoulder abduction and ER 2# 2x10 LUE each Heel raises: on mini tramp x20 Weight shifting on trampoline: 3 ways x1 min each cues to decrease UE support   10/01/2021: Nu Step L4 6 min while discussing status/progress Seated Rows:  25# 2x10 Hip Matrix:  40# for hip abduction and hip extension x10 bilat Supine shoulder flexion with 2# 2x10 LUE Sidelying shoulder abduction and ER 2# 2x10 LUE each Heel raises: on mini tramp x20 Weight shifting on trampoline: 3 ways x1 min  each cues to decrease UE support Kickstand stance trampoline rebounder with red ball x20 tosses with each LE as stance leg Sidestepping with blue band around thighs along mat table x 4 laps  09/19/2021: Nu Step L4 5 min while discussing status/progress Seated Rows:  25# 2x10 Hip Matrix:  40# for hip abduction and hip extension x10 bilat Supine shoulder flexion with 2# 2x10 LUE Sidelying shoulder abduction and ER 2# 2x10 LUE each Heel raises: on mini tramp x20 Weight shifting on trampoline: 3 ways x1 min each cues to decrease UE support Kickstand stance trampoline rebounder with red ball x20 tosses with  each LE as stance leg Sidestepping with blue band around thighs along mat table x 4 laps   PATIENT EDUCATION:  Education details: Access Code: [email or web address] Person educated: Patient Education method: Explanation, Demonstration, and Handouts Education comprehension: verbalized understanding   HOME EXERCISE PROGRAM: Access Code: [email or web address] URL: [email or web address] Date: 07/25/2021 Prepared by: Ruben Im  Exercises - Clamshell  - 1 x daily - 7 x weekly - 1 sets - 10 reps - Sidelying Hip Abduction  - 1 x daily - 7 x weekly - 1 sets - 10 reps - Hip Flexor Stretch at Edge of Bed  - 1 x daily - 7 x weekly - 1 sets - 3 reps - 20 hold - Plank on Knees  - 1 x daily - 7 x weekly - 1 sets - 10 reps - Gastroc Stretch on Wall  - 3 x daily - 7 x weekly - 1 sets - 3 reps - 10 hold - Standing Single Leg Stance with Counter Support  - 2 x daily - 7 x weekly - 1 sets - 3 reps - 10 hold - Sit to Stand Without Arm Support  - 2 x daily - 7 x weekly - 2 sets - 10 reps - Seated Hamstring Stretch  - 3 x daily - 7 x weekly - 1 sets - 3 reps - 20 hold - Heel Raises with Counter Support  - 2 x daily - 7 x weekly - 2 sets - 10 reps - Supine Shoulder Flexion with Free Weight (Mirrored)  - 1 x daily - 7 x weekly - 2 sets - 10 reps - Sidelying Shoulder Abduction Full Range of Motion with Dumbbell  -  1 x daily - 7 x weekly - 2 sets - 10 reps   ASSESSMENT:  CLINICAL IMPRESSION: Pt continues to report Lt shoulder and bil ankle pain.  Pt continues to perform HEP for strength and flexibility.  Lt shoulder strength is improved overall.  Ankle stability is limited due to chronic condition.  Pt continues to have limited use of Lt UE due to pain and limited endurance.  Pt will continue with HEP and follow-up with MD to discuss shoulder replacement surgery when ready.      GOALS: Goals reviewed with patient? Yes  SHORT TERM GOALS: Target date: 06/28/21   The patient will demonstrate knowledge of basic self care strategies and exercises to promote healing   Baseline: Goal status: MET  2.  The patient will have improved knee and hip strength to at least 4/5 needed for standing, walking longer distances and descending stairs at home and in the community  Baseline:  Goal status: In progress  3.  The patient will report a 40% improvement in pain levels with functional activities which are currently difficult including mobility in the mornings Baseline: able to do most activity, 20-25% reduced pain (07/23/21) Goal status: Goal Met 08/05/21  4.  The patient will have improved shoulder elevation ROM to at least 160 degrees needed for grooming/dressing purposes as well as reaching high shelves  Baseline: 145 degrees (06/18/21) Goal status: in progress   LONG TERM GOALS: Target date: 10/17/21   The patient will be independent in a safe self progression of a home exercise program to promote further recovery of function   Baseline:  Goal status: MET  2.  The patient will have bil hip abduction strength to grossly 4+/5 needed for heavier duty yard work  Baseline:  Goal status: MET  3.  The patient  will have improved LE strength and proprioception needed to single leg stand 8-10 sec  Baseline: 5-7 seconds  Goal status: Partially met   4.  The patient will have improved quad strength to 4+/5  descend steps without medial knee collapse and trunk lean  Baseline:improved stability however still collapsing Goal status: partially met   5.  The patient will have improved FOTO score to 74% indicating improved function with less pain  Baseline: 59 (declined on 10/01/21) Goal status: Deferred   PLAN:  D/C PT to [place], PT 10/15/21 8:14 AM   PHYSICAL THERAPY DISCHARGE SUMMARY  Visits from Start of Care: 19  Current functional level related to goals / functional outcomes: See above for current status.  Pt continues to have Lt shoulder pain and limited function due to insufficient RTC and significant OA.     Remaining deficits: Limited Lt UE functional strength and use.  Pt has HEP in place and will continue with this.     Education / Equipment: HEP   Patient agrees to discharge. Patient goals were partially met. Patient is being discharged due to being pleased with the current functional level. Pt will continue with HEP and consider Lt shoulder replacement  [facility] [address], [place], [place] [address] Phone # [phone] Fax [phone]

## 2021-10-23 ENCOUNTER — Ambulatory Visit (INDEPENDENT_AMBULATORY_CARE_PROVIDER_SITE_OTHER): Payer: BC Managed Care – PPO | Admitting: Sports Medicine

## 2021-10-23 ENCOUNTER — Ambulatory Visit (INDEPENDENT_AMBULATORY_CARE_PROVIDER_SITE_OTHER): Payer: BC Managed Care – PPO

## 2021-10-23 VITALS — BP 122/82 | HR 79 | Ht 70.0 in | Wt 266.0 lb

## 2021-10-23 DIAGNOSIS — M1712 Unilateral primary osteoarthritis, left knee: Secondary | ICD-10-CM

## 2021-10-23 DIAGNOSIS — M25562 Pain in left knee: Secondary | ICD-10-CM | POA: Diagnosis not present

## 2021-10-23 DIAGNOSIS — G8929 Other chronic pain: Secondary | ICD-10-CM

## 2021-10-23 NOTE — Progress Notes (Signed)
Brent Gordon D.[facility] [address] [place] [address] Phone: [phone]   Assessment and Plan:     1. Chronic pain of left knee 2. Primary osteoarthritis of left knee -Chronic with exacerbation, initial sports medicine visit - Consistent with flare of osteoarthritis based on HPI, physical exam, x-ray - X-ray obtained in clinic.  My interpretation: No acute fracture or dislocation.  Moderate loss of joint space and lateral compartment with history of meniscal tear which is likely led to this degeneration.  Mild cortical changes of patella - Patient elected for intra-articular CSI.  Tolerated well per note below  Procedure: Knee Joint Injection Side: Left Indication: Flare of osteoarthritis  Risks explained and consent was given verbally. The site was cleaned with alcohol prep. A needle was introduced with an anterio-medial approach. Injection given using 58mL of 1% lidocaine without epinephrine and 4mL of kenalog 40mg /ml. This was well tolerated and resulted in symptomatic relief.  Needle was removed, hemostasis achieved, and post injection instructions were explained.   Pt was advised to call or return to clinic if these symptoms worsen or fail to improve as anticipated.   Pertinent previous records reviewed include none   Follow Up: 3 weeks for reevaluation.  Could consider alternative intra-articular injection versus physical therapy versus advanced imaging based on symptoms   Subjective:   I, Brent Gordon, am serving as a for Doctor Neurosurgeon  Chief Complaint: left knee pain   HPI:   10/23/21 Patient is a 61 year old male complaining of left leg pain. Patient states he was seated for 4 hours , but may have started prior to the trip, not a lot of activity while he was down there , hx of meniscus tear, seems to be swelling in the back popliteal space, does get some numbness and tingling in the ball of the foot, no  radiating pain , no MOI, has had occasional neuropathy, does get a painful locking clicking and popping, some times it better after a night of rest sometimes its not , tylenol for the pain and that helps a little , tried some biofreeze and that helped a little , painful going up and down steps has to go slow, has been using the elevator at work to decrease the pain   Relevant Historical Information: History of left knee meniscal tear  Additional pertinent review of systems negative.   Current Outpatient Medications:    EPINEPHrine 0.3 mg/0.3 mL IJ SOAJ injection, , Disp: , Rfl:    fexofenadine (ALLEGRA) 180 MG tablet, TAKE 1 TABLET EVERY DAY AS NEEDED, Disp: , Rfl: 4   ibuprofen (ADVIL,MOTRIN) 200 MG tablet, Take 200 mg by mouth every 6 (six) hours as needed., Disp: , Rfl:    Multiple Vitamin (MULTIVITAMIN) tablet, Take 1 tablet by mouth daily., Disp: , Rfl:    tamsulosin (FLOMAX) 0.4 MG CAPS capsule, Take 1 capsule (0.4 mg total) by mouth daily., Disp: 90 capsule, Rfl: 3   Objective:     Vitals:   10/23/21 1552  BP: 122/82  Pulse: 79  SpO2: 96%  Weight: 266 lb (120.7 kg)  Height: 5\' 10"  (1.778 m)      Body mass index is 38.17 kg/m.    Physical Exam:    General:  awake, alert oriented, no acute distress nontoxic Skin: no suspicious lesions or rashes Neuro:sensation intact, no deficits, strength 5/5 with no deficits, no atrophy, normal muscle tone Psych: No signs of anxiety, depression  or other mood disorder  Knee: Moderate swelling No deformity Positive fluid wave, joint milking ROM Flex 100 ext 10 NTTP over the quad tendon, medial fem condyle, lat fem condyle, patella, plica, patella tendon, tibial tuberostiy, fibular head, posterior fossa, pes anserine bursa, gerdy's tubercle, medial jt line, lateral jt line Neg anterior and posterior drawer Neg lachman Neg sag sign Negative varus stress Negative valgus stress Negative McMurray Negative Thessaly  Gait normal     Electronically signed by:  Benito Mccreedy D.[facility] 4:22 PM 10/23/21

## 2021-10-23 NOTE — Patient Instructions (Addendum)
Good to see you ?Knee HEP  ?3 week follow up  ?

## 2021-10-24 DIAGNOSIS — J3081 Allergic rhinitis due to animal (cat) (dog) hair and dander: Secondary | ICD-10-CM | POA: Diagnosis not present

## 2021-10-24 DIAGNOSIS — J3089 Other allergic rhinitis: Secondary | ICD-10-CM | POA: Diagnosis not present

## 2021-10-24 DIAGNOSIS — R0602 Shortness of breath: Secondary | ICD-10-CM | POA: Diagnosis not present

## 2021-10-24 DIAGNOSIS — J301 Allergic rhinitis due to pollen: Secondary | ICD-10-CM | POA: Diagnosis not present

## 2021-10-30 DIAGNOSIS — G4733 Obstructive sleep apnea (adult) (pediatric): Secondary | ICD-10-CM | POA: Diagnosis not present

## 2021-11-12 NOTE — Progress Notes (Unsigned)
    Benito Mccreedy D.[place] Phone: [phone]   Assessment and Plan:     There are no diagnoses linked to this encounter.  ***   Pertinent previous records reviewed include ***   Follow Up: ***     Subjective:   I, Tage Feggins, am serving as a Education administrator for Doctor Glennon Mac   Chief Complaint: left knee pain    HPI:    10/23/21 Patient is a 61 year old male complaining of left leg pain. Patient states he was seated for 4 hours , but may have started prior to the trip, not a lot of activity while he was down there , hx of meniscus tear, seems to be swelling in the back popliteal space, does get some numbness and tingling in the ball of the foot, no radiating pain , no MOI, has had occasional neuropathy, does get a painful locking clicking and popping, some times it better after a night of rest sometimes its not , tylenol for the pain and that helps a little , tried some biofreeze and that helped a little , painful going up and down steps has to go slow, has been using the elevator at work to decrease the pain   11/13/2021 Patient states   Relevant Historical Information: History of left knee meniscal tear  Additional pertinent review of systems negative.   Current Outpatient Medications:    EPINEPHrine 0.3 mg/0.3 mL IJ SOAJ injection, , Disp: , Rfl:    fexofenadine (ALLEGRA) 180 MG tablet, TAKE 1 TABLET EVERY DAY AS NEEDED, Disp: , Rfl: 4   ibuprofen (ADVIL,MOTRIN) 200 MG tablet, Take 200 mg by mouth every 6 (six) hours as needed., Disp: , Rfl:    Multiple Vitamin (MULTIVITAMIN) tablet, Take 1 tablet by mouth daily., Disp: , Rfl:    tamsulosin (FLOMAX) 0.4 MG CAPS capsule, Take 1 capsule (0.4 mg total) by mouth daily., Disp: 90 capsule, Rfl: 3   Objective:     There were no vitals filed for this visit.    There is no height or weight on file to calculate BMI.    Physical Exam:     ***   Electronically signed by:  Benito Mccreedy D.[facility] 12:46 PM 11/12/21

## 2021-11-13 ENCOUNTER — Ambulatory Visit (INDEPENDENT_AMBULATORY_CARE_PROVIDER_SITE_OTHER): Payer: BC Managed Care – PPO | Admitting: Sports Medicine

## 2021-11-13 VITALS — HR 94 | Ht 70.0 in | Wt 261.0 lb

## 2021-11-13 DIAGNOSIS — M25562 Pain in left knee: Secondary | ICD-10-CM | POA: Diagnosis not present

## 2021-11-13 DIAGNOSIS — M1712 Unilateral primary osteoarthritis, left knee: Secondary | ICD-10-CM | POA: Diagnosis not present

## 2021-11-13 DIAGNOSIS — G8929 Other chronic pain: Secondary | ICD-10-CM

## 2021-11-13 NOTE — Patient Instructions (Addendum)
Good to see you Tylenol 650 mg 2-3 times a day  Knee HEP  2 month follow up

## 2021-11-21 DIAGNOSIS — J3089 Other allergic rhinitis: Secondary | ICD-10-CM | POA: Diagnosis not present

## 2021-11-21 DIAGNOSIS — J3081 Allergic rhinitis due to animal (cat) (dog) hair and dander: Secondary | ICD-10-CM | POA: Diagnosis not present

## 2021-11-21 DIAGNOSIS — J301 Allergic rhinitis due to pollen: Secondary | ICD-10-CM | POA: Diagnosis not present

## 2021-11-26 DIAGNOSIS — J3089 Other allergic rhinitis: Secondary | ICD-10-CM | POA: Diagnosis not present

## 2021-11-26 DIAGNOSIS — J301 Allergic rhinitis due to pollen: Secondary | ICD-10-CM | POA: Diagnosis not present

## 2021-11-26 DIAGNOSIS — J3081 Allergic rhinitis due to animal (cat) (dog) hair and dander: Secondary | ICD-10-CM | POA: Diagnosis not present

## 2021-11-30 DIAGNOSIS — G4733 Obstructive sleep apnea (adult) (pediatric): Secondary | ICD-10-CM | POA: Diagnosis not present

## 2021-12-02 ENCOUNTER — Encounter: Payer: Self-pay | Admitting: Family Medicine

## 2021-12-02 ENCOUNTER — Encounter: Payer: Self-pay | Admitting: Sports Medicine

## 2021-12-03 ENCOUNTER — Other Ambulatory Visit: Payer: Self-pay | Admitting: Sports Medicine

## 2021-12-03 DIAGNOSIS — G8929 Other chronic pain: Secondary | ICD-10-CM

## 2021-12-03 DIAGNOSIS — M1712 Unilateral primary osteoarthritis, left knee: Secondary | ICD-10-CM

## 2021-12-03 NOTE — Progress Notes (Unsigned)
New referral for PT has been placed.

## 2021-12-03 NOTE — Telephone Encounter (Signed)
Actually this result is considered prediabetes. Prediabetes is an A1c of 6.0 to 6.5%. He can lower this of course with diet and exercise. I would recheck an A1c 6 months from the last one (which would be around next February)

## 2021-12-03 NOTE — Telephone Encounter (Signed)
New referral for knee PT was placed

## 2021-12-04 ENCOUNTER — Other Ambulatory Visit: Payer: Self-pay

## 2021-12-04 DIAGNOSIS — R7309 Other abnormal glucose: Secondary | ICD-10-CM

## 2021-12-18 ENCOUNTER — Encounter: Payer: Self-pay | Admitting: Physical Therapy

## 2021-12-18 ENCOUNTER — Ambulatory Visit: Payer: BC Managed Care – PPO | Attending: Sports Medicine | Admitting: Physical Therapy

## 2021-12-18 ENCOUNTER — Other Ambulatory Visit: Payer: Self-pay

## 2021-12-18 ENCOUNTER — Ambulatory Visit: Payer: BC Managed Care – PPO | Admitting: Physical Therapy

## 2021-12-18 DIAGNOSIS — M1712 Unilateral primary osteoarthritis, left knee: Secondary | ICD-10-CM | POA: Diagnosis not present

## 2021-12-18 DIAGNOSIS — M25562 Pain in left knee: Secondary | ICD-10-CM | POA: Insufficient documentation

## 2021-12-18 DIAGNOSIS — R293 Abnormal posture: Secondary | ICD-10-CM | POA: Insufficient documentation

## 2021-12-18 DIAGNOSIS — G8929 Other chronic pain: Secondary | ICD-10-CM | POA: Diagnosis not present

## 2021-12-18 DIAGNOSIS — J3081 Allergic rhinitis due to animal (cat) (dog) hair and dander: Secondary | ICD-10-CM | POA: Diagnosis not present

## 2021-12-18 DIAGNOSIS — J3089 Other allergic rhinitis: Secondary | ICD-10-CM | POA: Diagnosis not present

## 2021-12-18 DIAGNOSIS — M25561 Pain in right knee: Secondary | ICD-10-CM | POA: Diagnosis not present

## 2021-12-18 DIAGNOSIS — R2689 Other abnormalities of gait and mobility: Secondary | ICD-10-CM | POA: Insufficient documentation

## 2021-12-18 DIAGNOSIS — J301 Allergic rhinitis due to pollen: Secondary | ICD-10-CM | POA: Diagnosis not present

## 2021-12-18 NOTE — Therapy (Signed)
OUTPATIENT PHYSICAL THERAPY LOWER EXTREMITY EVALUATION   Patient Name: Brent Gordon MRN: [id number] DOB:1960-08-22, 61 y.o., male Today's Date: 12/18/2021  END OF SESSION:  PT End of Session - 12/18/21 0930     Visit Number 1    Date for PT Re-Evaluation 02/12/22    Authorization Type [employer]    PT Start Time 0845    PT Stop Time 0930    PT Time Calculation (min) 45 min    Activity Tolerance Patient tolerated treatment well    Behavior During Therapy [facility] for tasks assessed/performed             Past Medical History:  Diagnosis Date   Allergy    sees Dr. Vergia Alberts   Obesity    Pain, low back    Sleep apnea    PSG 12/01/06 see's Dr Coralyn Helling   Varicose veins    Past Surgical History:  Procedure Laterality Date   COLONOSCOPY  08/01/2019   Dr. Charlott Rakes, diverticulosis, internal hemorrhoids, no polyps, repeat in 5 yrs   FOOT SURGERY Left 09/26/2011   triple arthrodesis per Dr. Toni Arthurs   HERNIA REPAIR  2005/2006   Dr. Abbey Chatters   leg vein ablation     right lower, Dr. Consuela Mimes   TONSILLECTOMY  01/13/1965   Patient Active Problem List   Diagnosis Date Noted   Concussion 10/16/2020   Knee pain 05/16/2016   OBSTRUCTIVE SLEEP APNEA 10/16/2006   Acute low back pain with radicular symptoms, duration less than 6 weeks 09/24/2006    PCP: Dr. Gershon Crane  REFERRING PROVIDER: Richardean Sale, DO  REFERRING DIAG: [phone] (ICD-10-CM) - Chronic pain of left knee M17.12 (ICD-10-CM) - Primary osteoarthritis of left knee M25.561,M25.562,G89.29 (ICD-10-CM) - Chronic pain of both knees  THERAPY DIAG:  Chronic pain of left knee - Plan: PT plan of care cert/re-cert  Other abnormalities of gait and mobility - Plan: PT plan of care cert/re-cert  Abnormal posture - Plan: PT plan of care cert/re-cert  Rationale for Evaluation and Treatment: Rehabilitation  ONSET DATE: Oct 2023  SUBJECTIVE:   SUBJECTIVE STATEMENT: Pt with complex  orthopedic history referred to PT for Lt knee pain.  Pt states knees started bothering him in Oct after driving 4 hours.  Got an injection which has reduced swelling in Lt knee but not the pain.  I can feel it clicking and it is stiff and can lock up when seated esp in car.   Pt with history of Lt ankle arthrodesis so has no medial/lateral motion. In the past I get in the pool and swim laps.    PERTINENT HISTORY: Acquired flat foot syndrome Left foot surgery 7 screws triple arthrodesis;  left knee meniscus repair 2 years ago Wears off the shelf orthotics Needs shoulder replacement Bil knee OA PAIN:  PAIN:  Are you having pain? Yes NPRS scale: 3-8/10 Pain location: deep Lt knee Pain orientation: Left  PAIN TYPE: aching, sharp, and catching Pain description: constant  Aggravating factors: prolonged sitting, walking > 1 mile (avg 9k steps/day) Relieving factors: elevate, walking short distances   PRECAUTIONS: None  WEIGHT BEARING RESTRICTIONS: No  FALLS:  Has patient fallen in last 6 months? 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 Tricompartmental osteoarthritic changes of the left knee, moderate-severe within the lateral compartment, progressed from prior. Small knee joint effusion. No soft tissue swelling.   IMPRESSION: Tricompartmental osteoarthritic changes of the left knee, moderate-severe within the lateral compartment, progressed from prior  PATIENT SURVEYS:  FOTO 57% goal 74%  COGNITION: Overall cognitive status: Within functional limits for tasks assessed     SENSATION: Lt tingling in foot  EDEMA:   Circumferential: Lt 45cm, Rt 44cm  MUSCLE LENGTH: Hamstrings: Right 75 deg; Left 75 deg  Lt ITB limited Lt quad limited 50%  POSTURE:  Lt knee valgus, Lt lateral trunk lean , Lt flat foot  PALPATION: Limited patellar mobility Lt compared to Rt with crepitus present Atrophy present bil quads with poor VMO activation Lt>Rt  LOWER EXTREMITY ROM:  Active ROM Right eval Left eval  Hip flexion    Hip extension    Hip abduction    Hip adduction    Hip internal rotation    Hip external rotation    Knee flexion 125 121  Knee extension  0  Ankle dorsiflexion    Ankle plantarflexion    Ankle inversion    Ankle eversion     (Blank rows = not tested)  LOWER EXTREMITY MMT:  MMT Right eval Left eval  Hip flexion 4+ 4+  Hip extension 4 4  Hip abduction 4 4-  Hip adduction 4 4  Hip internal rotation 5 5  Hip external rotation 4+ 4+  Knee flexion 5 4+  Knee extension 4 3+  Ankle dorsiflexion 4+ 4  Ankle plantarflexion    Ankle inversion    Ankle eversion     (Blank rows = not tested)  LOWER EXTREMITY SPECIAL TESTS:  Knee special tests: Patellafemoral grind test: positive   FUNCTIONAL TESTS:    GAIT: Distance walked: within clinic to assess gait Assistive device utilized: None Level of assistance: Complete Independence Comments: Lt genu valgus, Lt lateral trunk lean, limited glut med activation in gait, arch collapse (Hx of aquired flat foot and Lt ankle surgery with hardward)   TODAY'S TREATMENT:                                                                                                                              DATE:  12/18/21:  Initiated HEP Discussed evaluation findings   PATIENT EDUCATION:  Education details: Access Code: [email or web address] Person educated: Patient Education method: Programmer, multimedia, Facilities manager, Verbal cues, and Handouts Education comprehension: verbalized understanding and returned demonstration  HOME EXERCISE PROGRAM: Access Code:  [email or web address] URL: [email or web address] Date: 12/18/2021 Prepared by: Loistine Simas Liadan Guizar  Exercises - Hooklying Single Knee to Chest Stretch  - 2 x daily - 7 x weekly - 1 sets - 3 reps - 20 hold - Quadricep Stretch with Chair and Counter Support  - 1 x daily - 7 x weekly - 1 sets -  3 reps - 20 hold - Standing Quad Stretch with Strap  - 1 x daily - 7 x weekly - 1 sets - 3 reps - 20 hold - Gastroc Stretch on Wall  - 1 x daily - 7 x weekly - 1 sets - 3 reps - 20 hold - Seated Long Arc Quad  - 1 x daily - 7 x weekly - 3 sets - 10 reps - 5 hold - Seated Hip Abduction with Resistance  - 1 x daily - 7 x weekly - 3 sets - 10 reps  ASSESSMENT:  CLINICAL IMPRESSION: Patient is a 61 y.o. male who was seen today for physical therapy evaluation and treatment for bil knee pain Lt>Rt. He has signif OA in Lt knee.  OBJECTIVE IMPAIRMENTS: Abnormal gait, decreased activity tolerance, decreased coordination, difficulty walking, decreased ROM, decreased strength, hypomobility, impaired flexibility, improper body mechanics, postural dysfunction, and pain.   ACTIVITY LIMITATIONS: carrying, sitting, standing, squatting, stairs, and locomotion level  PARTICIPATION LIMITATIONS: cleaning, driving, shopping, and community activity  PERSONAL FACTORS: 1-2 comorbidities: Lt ankle arthrodesis, tri-compartment OA of Lt knee  are also affecting patient's functional outcome.   REHAB POTENTIAL: Good  CLINICAL DECISION MAKING: Stable/uncomplicated  EVALUATION COMPLEXITY: Low   GOALS: Goals reviewed with patient? Yes  SHORT TERM GOALS: Target date: 01/15/22 Pt will be ind with initial HEP without exacerbation of symptoms Baseline: Goal status: INITIAL  2.  Pt will demo improved Lt SL knee flexion to at least 110 deg for improved quad length Baseline: 90 deg Goal status: INITIAL  3.  Pt will demo improved quad recruitment throughout Lt quad (VMO definition) with loaded LAQ. Baseline:  Goal status:  INITIAL  4.  Pt will report reduced pain and catching incidence after sitting in car and at work by at least 30% Baseline:  Goal status: INITIAL    LONG TERM GOALS: Target date: 02/12/22  Pt will be ind with advanced HEP and understand how to manage symptoms. Baseline:  Goal status: INITIAL  2.  Pt will improve FOTO score to at least 74% to demo improved function. Baseline: 57% Goal status: INITIAL  3.  Pt will improve Lt hip abd and Lt knee strength to at least 4+/5 for improved gait and walking tolerance. Baseline:  Goal status: INITIAL  4.  Pt will improve Lt LE flexibility of quad, gluteals and ITB to WNL to reduce undue strain at knee Baseline:  Goal status: INITIAL  5.  Pt will report improved pain with walking 1 mile by at least 70% Baseline:  Goal status: INITIAL  6.  Pt will achieve Lt knee flexion of 125 deg for improved stairs. Baseline: 121 deg Goal status: INITIAL   PLAN:  PT FREQUENCY: 1x/week  PT DURATION: 8 weeks  PLANNED INTERVENTIONS: Therapeutic exercises, Therapeutic activity, Neuromuscular re-education, Balance training, Gait training, Patient/Family education, Self Care, Joint mobilization, Stair training, Aquatic Therapy, Dry Needling, Electrical stimulation, Cryotherapy, Moist heat, Taping, Vasopneumatic device, Ionotophoresis 4mg /ml Dexamethasone, and Manual therapy  PLAN FOR NEXT SESSION: NuStep, review HEP, progress hip and knee flexibility and strength, encourage Pt to get back in pool for lap swimming as discussed at evaluation   , PT 12/18/21 10:38 AM

## 2021-12-30 DIAGNOSIS — G4733 Obstructive sleep apnea (adult) (pediatric): Secondary | ICD-10-CM | POA: Diagnosis not present

## 2022-01-01 DIAGNOSIS — G4733 Obstructive sleep apnea (adult) (pediatric): Secondary | ICD-10-CM | POA: Diagnosis not present

## 2022-01-02 ENCOUNTER — Ambulatory Visit: Payer: BC Managed Care – PPO | Admitting: Physical Therapy

## 2022-01-02 DIAGNOSIS — R2689 Other abnormalities of gait and mobility: Secondary | ICD-10-CM | POA: Diagnosis not present

## 2022-01-02 DIAGNOSIS — M1712 Unilateral primary osteoarthritis, left knee: Secondary | ICD-10-CM | POA: Diagnosis not present

## 2022-01-02 DIAGNOSIS — M25562 Pain in left knee: Secondary | ICD-10-CM | POA: Diagnosis not present

## 2022-01-02 DIAGNOSIS — G8929 Other chronic pain: Secondary | ICD-10-CM

## 2022-01-02 DIAGNOSIS — M25561 Pain in right knee: Secondary | ICD-10-CM | POA: Diagnosis not present

## 2022-01-02 DIAGNOSIS — R293 Abnormal posture: Secondary | ICD-10-CM | POA: Diagnosis not present

## 2022-01-02 NOTE — Therapy (Signed)
OUTPATIENT PHYSICAL THERAPY LOWER EXTREMITY PROGRESS NOTE   Patient Name: Brent Gordon MRN: [id number] DOB:1960-03-29, 61 y.o., male Today's Date: 12/18/2021  END OF SESSION:  PT End of Session - 01/02/22 0751     Visit Number 2    Date for PT Re-Evaluation 02/12/22    Authorization Type BCBS    PT Start Time 0752    PT Stop Time 0835    PT Time Calculation (min) 43 min    Activity Tolerance Patient tolerated treatment well               Past Medical History:  Diagnosis Date   Allergy    sees Dr. Vergia Alberts   Obesity    Pain, low back    Sleep apnea    PSG 12/01/06 see's Dr Coralyn Helling   Varicose veins    Past Surgical History:  Procedure Laterality Date   COLONOSCOPY  08/01/2019   Dr. Charlott Rakes, diverticulosis, internal hemorrhoids, no polyps, repeat in 5 yrs   FOOT SURGERY Left 09/26/2011   triple arthrodesis per Dr. Toni Arthurs   HERNIA REPAIR  2005/2006   Dr. Abbey Chatters   leg vein ablation     right lower, Dr. Consuela Mimes   TONSILLECTOMY  01/13/1965   Patient Active Problem List   Diagnosis Date Noted   Concussion 10/16/2020   Knee pain 05/16/2016   OBSTRUCTIVE SLEEP APNEA 10/16/2006   Acute low back pain with radicular symptoms, duration less than 6 weeks 09/24/2006    PCP: Dr. Gershon Crane  REFERRING PROVIDER: Richardean Sale, DO  REFERRING DIAG: [phone] (ICD-10-CM) - Chronic pain of left knee M17.12 (ICD-10-CM) - Primary osteoarthritis of left knee M25.561,M25.562,G89.29 (ICD-10-CM) - Chronic pain of both knees  THERAPY DIAG:  Chronic pain of left knee - Plan: PT plan of care cert/re-cert  Other abnormalities of gait and mobility - Plan: PT plan of care cert/re-cert  Abnormal posture - Plan: PT plan of care cert/re-cert  Rationale for Evaluation and Treatment: Rehabilitation  ONSET DATE: Oct 2023  SUBJECTIVE:   SUBJECTIVE STATEMENT: I can feel it in my knee and shoulder b/c of the weather.  I walk about a mile  each night.   Dr. Jean Rosenthal in early January.  I'm not sure if the injection helped or not.     PERTINENT HISTORY: Acquired flat foot syndrome Left foot surgery 7 screws triple arthrodesis;  left knee meniscus repair 2 years ago Wears off the shelf orthotics Needs shoulder replacement Bil knee OA PAIN:  PAIN:  Are you having pain? Yes NPRS scale:  3/10 Pain location: deep Lt knee Pain orientation: Left  PAIN TYPE: aching, sharp, and catching Pain description: constant  Aggravating factors: prolonged sitting, walking > 1 mile (avg 9k steps/day) Relieving factors: elevate, walking short distances   PRECAUTIONS: None  WEIGHT BEARING RESTRICTIONS: No  FALLS:  Has patient fallen in last 6 months? 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 Tricompartmental osteoarthritic changes of the left knee, moderate-severe within the lateral compartment, progressed from prior. Small knee joint effusion. No soft tissue swelling.   IMPRESSION: Tricompartmental osteoarthritic changes of the left knee, moderate-severe  within the lateral compartment, progressed from prior  PATIENT SURVEYS:  FOTO 57% goal 74%  COGNITION: Overall cognitive status: Within functional limits for tasks assessed     SENSATION: Lt tingling in foot  EDEMA:  Circumferential: Lt 45cm, Rt 44cm  MUSCLE LENGTH: Hamstrings: Right 75 deg; Left 75 deg  Lt ITB limited Lt quad limited 50%  POSTURE:  Lt knee valgus, Lt lateral trunk lean , Lt flat foot  PALPATION: Limited patellar mobility Lt compared to Rt with crepitus present Atrophy present bil quads with  poor VMO activation Lt>Rt  LOWER EXTREMITY ROM:  Active ROM Right eval Left eval  Hip flexion    Hip extension    Hip abduction    Hip adduction    Hip internal rotation    Hip external rotation    Knee flexion 125 121  Knee extension  0  Ankle dorsiflexion    Ankle plantarflexion    Ankle inversion    Ankle eversion     (Blank rows = not tested)  LOWER EXTREMITY MMT:  MMT Right eval Left eval  Hip flexion 4+ 4+  Hip extension 4 4  Hip abduction 4 4-  Hip adduction 4 4  Hip internal rotation 5 5  Hip external rotation 4+ 4+  Knee flexion 5 4+  Knee extension 4 3+  Ankle dorsiflexion 4+ 4  Ankle plantarflexion    Ankle inversion    Ankle eversion     (Blank rows = not tested)  LOWER EXTREMITY SPECIAL TESTS:  Knee special tests: Patellafemoral grind test: positive   FUNCTIONAL TESTS:    GAIT: Distance walked: within clinic to assess gait Assistive device utilized: None Level of assistance: Complete Independence Comments: Lt genu valgus, Lt lateral trunk lean, limited glut med activation in gait, arch collapse (Hx of aquired flat foot and Lt ankle surgery with hardward)   TODAY'S TREATMENT:                                                                                                                              DATE:  01/02/22: Nu-Step seat 12 L1 6 min while discussing status SKTC 4x left only SLR 10x  Sidelying left hip abduction 10x Seated green band clams 10x Hold on LAQ secondary to painful clunk/crepitus Seated floor sliders 10x Seated green band quad isometric 10 sec hold 5x Seated green band HS isometric 10 sec hold 5x Wall sits isometric hold at 30 degrees 4x 10 sec holds Ball on wall hip abduction isometric 10 sec hold 4x right/left      12/18/21:  Initiated HEP Discussed evaluation findings   PATIENT EDUCATION:  Education details: Access Code: [email or web address] Person educated: Patient Education method: Programmer, multimedia, Facilities manager, Verbal  cues, and Handouts Education comprehension: verbalized understanding and returned demonstration  HOME EXERCISE PROGRAM: Access Code: [email or web address] URL: [email or web address] Date: 01/02/2022 Prepared by: Lavinia Sharps  Exercises - Hooklying Single Knee to Chest Stretch  - 2 x daily -  7 x weekly - 1 sets - 3 reps - 20 hold - Quadricep Stretch with Chair and Counter Support  - 1 x daily - 7 x weekly - 1 sets - 3 reps - 20 hold - Standing Quad Stretch with Strap  - 1 x daily - 7 x weekly - 1 sets - 3 reps - 20 hold - Gastroc Stretch on Wall  - 1 x daily - 7 x weekly - 1 sets - 3 reps - 20 hold - Seated Hip Abduction with Resistance  - 1 x daily - 7 x weekly - 3 sets - 10 reps - Sitting Knee Extension with Resistance (Mirrored)  - 1 x daily - 7 x weekly - 1 sets - 10 reps - 10 hold - Seated Heel Slide (Mirrored)  - 1 x daily - 7 x weekly - 1 sets - 10 reps - Seated Hamstring Curl with Anchored Resistance  - 1 x daily - 7 x weekly - 1 sets - 10 reps - 10 hold - Wall Sit  - 1 x daily - 7 x weekly - 1 sets - 3-4 reps - 10 hold - Standing Isometric Hip Abduction with Ball on Wall  - 1 x daily - 7 x weekly - 1 sets - 3-4 reps - 10 hold  ASSESSMENT:  CLINICAL IMPRESSION:  Palpable knee crepitus and painful clunk with LAQ ex's so modified to perform isometrically with band at 30 degrees of knee flexion.  Also updated HEP to include glute and HS isometric strengthening as well.  No pain or crepitus with isometrics but good muscular contraction produced.     OBJECTIVE IMPAIRMENTS: Abnormal gait, decreased activity tolerance, decreased coordination, difficulty walking, decreased ROM, decreased strength, hypomobility, impaired flexibility, improper body mechanics, postural dysfunction, and pain.   ACTIVITY LIMITATIONS: carrying, sitting, standing, squatting, stairs, and locomotion level  PARTICIPATION LIMITATIONS: cleaning, driving, shopping, and community activity  PERSONAL FACTORS: 1-2  comorbidities: Lt ankle arthrodesis, tri-compartment OA of Lt knee  are also affecting patient's functional outcome.   REHAB POTENTIAL: Good  CLINICAL DECISION MAKING: Stable/uncomplicated  EVALUATION COMPLEXITY: Low   GOALS: Goals reviewed with patient? Yes  SHORT TERM GOALS: Target date: 01/15/22 Pt will be ind with initial HEP without exacerbation of symptoms Baseline: Goal status: INITIAL  2.  Pt will demo improved Lt SL knee flexion to at least 110 deg for improved quad length Baseline: 90 deg Goal status: INITIAL  3.  Pt will demo improved quad recruitment throughout Lt quad (VMO definition) with loaded LAQ. Baseline:  Goal status: INITIAL  4.  Pt will report reduced pain and catching incidence after sitting in car and at work by at least 30% Baseline:  Goal status: INITIAL    LONG TERM GOALS: Target date: 02/12/22  Pt will be ind with advanced HEP and understand how to manage symptoms. Baseline:  Goal status: INITIAL  2.  Pt will improve FOTO score to at least 74% to demo improved function. Baseline: 57% Goal status: INITIAL  3.  Pt will improve Lt hip abd and Lt knee strength to at least 4+/5 for improved gait and walking tolerance. Baseline:  Goal status: INITIAL  4.  Pt will improve Lt LE flexibility of quad, gluteals and ITB to WNL to reduce undue strain at knee Baseline:  Goal status: INITIAL  5.  Pt will report improved pain with walking 1 mile by at least 70% Baseline:  Goal status: INITIAL  6.  Pt will achieve Lt knee  flexion of 125 deg for improved stairs. Baseline: 121 deg Goal status: INITIAL   PLAN:  PT FREQUENCY: 1x/week  PT DURATION: 8 weeks  PLANNED INTERVENTIONS: Therapeutic exercises, Therapeutic activity, Neuromuscular re-education, Balance training, Gait training, Patient/Family education, Self Care, Joint mobilization, Stair training, Aquatic Therapy, Dry Needling, Electrical stimulation, Cryotherapy, Moist heat, Taping,  Vasopneumatic device, Ionotophoresis 4mg /ml Dexamethasone, and Manual therapy  PLAN FOR NEXT SESSION: NuStep, review HEP, progress hip and knee flexibility and strength (isometrically); glute strengthening  , PT 01/02/22 8:52 AM Phone: [phone] Fax: [phone]

## 2022-01-07 ENCOUNTER — Ambulatory Visit: Payer: BC Managed Care – PPO | Attending: Family Medicine | Admitting: Physical Therapy

## 2022-01-07 DIAGNOSIS — M25562 Pain in left knee: Secondary | ICD-10-CM | POA: Diagnosis not present

## 2022-01-07 DIAGNOSIS — R2689 Other abnormalities of gait and mobility: Secondary | ICD-10-CM | POA: Insufficient documentation

## 2022-01-07 DIAGNOSIS — G8929 Other chronic pain: Secondary | ICD-10-CM | POA: Insufficient documentation

## 2022-01-07 NOTE — Therapy (Signed)
OUTPATIENT PHYSICAL THERAPY LOWER EXTREMITY PROGRESS NOTE   Patient Name: Brent Gordon MRN: [id number] DOB:[birth year], 61 y.o., male Today's Date: 01/07/2022  END OF SESSION:  PT End of Session - 01/07/22 1024     Visit Number 3    Date for PT Re-Evaluation 02/12/22    Authorization Type BCBS    PT Start Time 1021    PT Stop Time 1100    PT Time Calculation (min) 39 min    Activity Tolerance Patient tolerated treatment well               Past Medical History:  Diagnosis Date   Allergy    sees Dr. Vergia Alberts   Obesity    Pain, low back    Sleep apnea    PSG 12/01/06 see's Dr Coralyn Helling   Varicose veins    Past Surgical History:  Procedure Laterality Date   COLONOSCOPY  08/01/2019   Dr. Charlott Rakes, diverticulosis, internal hemorrhoids, no polyps, repeat in 5 yrs   FOOT SURGERY Left 09/26/2011   triple arthrodesis per Dr. Toni Arthurs   HERNIA REPAIR  2005/2006   Dr. Abbey Chatters   leg vein ablation     right lower, Dr. Consuela Mimes   TONSILLECTOMY  01/13/1965   Patient Active Problem List   Diagnosis Date Noted   Concussion 10/16/2020   Knee pain 05/16/2016   OBSTRUCTIVE SLEEP APNEA 10/16/2006   Acute low back pain with radicular symptoms, duration less than 6 weeks 09/24/2006    PCP: Dr. Gershon Crane  REFERRING PROVIDER: Richardean Sale, DO  REFERRING DIAG: [phone] (ICD-10-CM) - Chronic pain of left knee M17.12 (ICD-10-CM) - Primary osteoarthritis of left knee M25.561,M25.562,G89.29 (ICD-10-CM) - Chronic pain of both knees  THERAPY DIAG:  Chronic pain of left knee  Other abnormalities of gait and mobility  Rationale for Evaluation and Treatment: Rehabilitation  ONSET DATE: Oct 2023  SUBJECTIVE:   SUBJECTIVE STATEMENT: Did OK after last time.  Stiffness.  My dog insists on a walk every evening and that helps.     Dr. Jean Rosenthal in early January.   PERTINENT HISTORY: Acquired flat foot syndrome Left foot surgery 7 screws  triple arthrodesis;  left knee meniscus repair 2 years ago Wears off the shelf orthotics Needs shoulder replacement Bil knee OA PAIN:  PAIN:  Are you having pain? Yes NPRS scale:  3/10 Pain location: deep Lt knee Pain orientation: Left  PAIN TYPE: aching, sharp, and catching Pain description: constant  Aggravating factors: prolonged sitting, walking > 1 mile (avg 9k steps/day) Relieving factors: elevate, walking short distances   PRECAUTIONS: None  WEIGHT BEARING RESTRICTIONS: No  FALLS:  Has patient fallen in last 6 months? 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 Tricompartmental osteoarthritic changes of the left knee, moderate-severe within the lateral compartment, progressed from prior. Small knee joint effusion. No soft tissue swelling.   IMPRESSION: Tricompartmental osteoarthritic changes of the left knee, moderate-severe within the lateral compartment, progressed from prior  PATIENT SURVEYS:  FOTO 57% goal 74%  COGNITION: Overall cognitive status: Within functional limits for tasks assessed     SENSATION: Lt tingling in foot  EDEMA:  Circumferential: Lt 45cm, Rt 44cm  MUSCLE LENGTH: Hamstrings: Right 75 deg; Left 75 deg  Lt ITB limited Lt quad limited 50%  POSTURE:  Lt knee valgus, Lt lateral trunk lean , Lt flat foot  PALPATION: Limited patellar mobility Lt compared to Rt with crepitus present Atrophy present bil quads with poor VMO activation Lt>Rt  LOWER EXTREMITY ROM:  Active ROM Right eval Left eval  Hip flexion    Hip extension    Hip abduction    Hip adduction    Hip internal  rotation    Hip external rotation    Knee flexion 125 121  Knee extension  0  Ankle dorsiflexion    Ankle plantarflexion    Ankle inversion    Ankle eversion     (Blank rows = not tested)  LOWER EXTREMITY MMT:  MMT Right eval Left eval  Hip flexion 4+ 4+  Hip extension 4 4  Hip abduction 4 4-  Hip adduction 4 4  Hip internal rotation 5 5  Hip external rotation 4+ 4+  Knee flexion 5 4+  Knee extension 4 3+  Ankle dorsiflexion 4+ 4  Ankle plantarflexion    Ankle inversion    Ankle eversion     (Blank rows = not tested)  LOWER EXTREMITY SPECIAL TESTS:  Knee special tests: Patellafemoral grind test: positive   FUNCTIONAL TESTS:    GAIT: Distance walked: within clinic to assess gait Assistive device utilized: None Level of assistance: Complete Independence Comments: Lt genu valgus, Lt lateral trunk lean, limited glut med activation in gait, arch collapse (Hx of aquired flat foot and Lt ankle surgery with hardward)   TODAY'S TREATMENT:        01/07/22: Nu-Step old model seat 12 L2 6 min while discussing status Hold on LAQ secondary to painful clunk/crepitus Seated knee flexion floor sliders for ROM 20x Seated green band quad isometric using floor slider 10 sec hold 10x Seated green band HS isometric using floor slider 10 sec hold 10x Leg press seat 8 70# 20x bil; 30# left only 20x Hip machine 40#: abduction, extension and hip extension 10x each way right/left  Resisted cable walk 15# backwards only 7-8x (some discomfort)                                                                                                                            DATE:  01/02/22: Nu-Step seat 12 L1 6 min while discussing status SKTC 4x left only SLR 10x  Sidelying left hip abduction 10x Seated green band clams 10x Hold on LAQ secondary to painful clunk/crepitus Seated floor sliders 10x Seated green band quad isometric 10 sec hold 5x Seated green band HS isometric 10 sec hold  5x Wall sits isometric hold at 30 degrees 4x 10 sec holds Ball on wall hip abduction isometric 10 sec hold 4x right/left      12/18/21:  Initiated HEP Discussed evaluation findings   PATIENT EDUCATION:  Education details: Access Code: [email or web address] Person educated: Patient Education method: Explanation, Demonstration, Verbal cues, and Handouts Education comprehension: verbalized understanding and returned demonstration  HOME EXERCISE PROGRAM: Access Code: [email or web address] URL: [email or web address] Date: 01/02/2022 Prepared by: Lavinia Sharps  Exercises - Hooklying Single Knee to Chest Stretch  - 2 x daily - 7 x weekly - 1 sets - 3 reps - 20 hold - Quadricep Stretch with Chair and Counter Support  - 1 x daily - 7 x weekly - 1 sets - 3 reps - 20 hold - Standing Quad Stretch with Strap  - 1 x daily - 7 x weekly - 1 sets - 3 reps - 20 hold - Gastroc Stretch on Wall  - 1 x daily - 7 x weekly - 1 sets - 3 reps - 20 hold - Seated Hip Abduction with Resistance  - 1 x daily - 7 x weekly - 3 sets - 10 reps - Sitting Knee Extension with Resistance (Mirrored)  - 1 x daily - 7 x weekly - 1 sets - 10 reps - 10 hold - Seated Heel Slide (Mirrored)  - 1 x daily - 7 x weekly - 1 sets - 10 reps - Seated Hamstring Curl with Anchored Resistance  - 1 x daily - 7 x weekly - 1 sets - 10 reps - 10 hold - Wall Sit  - 1 x daily - 7 x weekly - 1 sets - 3-4 reps - 10 hold - Standing Isometric Hip Abduction with Ball on Wall  - 1 x daily - 7 x weekly - 1 sets - 3-4 reps - 10 hold  ASSESSMENT:  CLINICAL IMPRESSION: Responds well to isometrics and leg press (closed chain) without painful crepitus.  Therapist monitoring response throughout session and modifying accordingly.  Patient considering joining [employer] after rehab.     OBJECTIVE IMPAIRMENTS: Abnormal gait, decreased activity tolerance, decreased coordination, difficulty walking, decreased ROM, decreased strength, hypomobility, impaired  flexibility, improper body mechanics, postural dysfunction, and pain.   ACTIVITY LIMITATIONS: carrying, sitting, standing, squatting, stairs, and locomotion level  PARTICIPATION LIMITATIONS: cleaning, driving, shopping, and community activity  PERSONAL FACTORS: 1-2 comorbidities: Lt ankle arthrodesis, tri-compartment OA of Lt knee  are also affecting patient's functional outcome.   REHAB POTENTIAL: Good  CLINICAL DECISION MAKING: Stable/uncomplicated  EVALUATION COMPLEXITY: Low   GOALS: Goals reviewed with patient? Yes  SHORT TERM GOALS: Target date: 01/15/22 Pt will be ind with initial HEP without exacerbation of symptoms Baseline: Goal status: INITIAL  2.  Pt will demo improved Lt SL knee flexion to at least 110 deg for improved quad length Baseline: 90 deg Goal status: INITIAL  3.  Pt will demo improved quad recruitment throughout Lt quad (VMO definition) with loaded LAQ. Baseline:  Goal status: INITIAL  4.  Pt will report reduced pain and catching incidence after sitting in car and at work by at least 30% Baseline:  Goal status: INITIAL    LONG TERM GOALS: Target date: 02/12/22  Pt will be ind with advanced HEP and understand how to manage symptoms. Baseline:  Goal status: INITIAL  2.  Pt will improve FOTO score to at least 74% to demo improved function. Baseline: 57% Goal status: INITIAL  3.  Pt will improve Lt hip abd and Lt knee strength to at least 4+/5 for improved gait and walking tolerance. Baseline:  Goal status: INITIAL  4.  Pt will improve Lt LE flexibility of quad, gluteals and ITB to WNL to reduce undue strain at knee  Baseline:  Goal status: INITIAL  5.  Pt will report improved pain with walking 1 mile by at least 70% Baseline:  Goal status: INITIAL  6.  Pt will achieve Lt knee flexion of 125 deg for improved stairs. Baseline: 121 deg Goal status: INITIAL   PLAN:  PT FREQUENCY: 1x/week  PT DURATION: 8 weeks  PLANNED INTERVENTIONS:  Therapeutic exercises, Therapeutic activity, Neuromuscular re-education, Balance training, Gait training, Patient/Family education, Self Care, Joint mobilization, Stair training, Aquatic Therapy, Dry Needling, Electrical stimulation, Cryotherapy, Moist heat, Taping, Vasopneumatic device, Ionotophoresis 4mg /ml Dexamethasone, and Manual therapy  PLAN FOR NEXT SESSION: NuStep, review HEP, progress hip and knee flexibility and strength (isometrically); glute strengthening  Lavinia SharpsStacy Taren Dymek, PT 01/07/22 2:49 PM Phone: [phone] Fax: [phone]

## 2022-01-10 DIAGNOSIS — J3089 Other allergic rhinitis: Secondary | ICD-10-CM | POA: Diagnosis not present

## 2022-01-14 NOTE — Progress Notes (Signed)
    Brent Gordon D.[facility]wood Cotter Phone: [phone]   Assessment and Plan:     There are no diagnoses linked to this encounter.  ***   Pertinent previous records reviewed include ***   Follow Up: ***     Subjective:   I, Brent Gordon, am serving as a Education administrator for Doctor Glennon Mac   Chief Complaint: left knee pain    HPI:    10/23/21 Patient is a 62 year old male complaining of left leg pain. Patient states he was seated for 4 hours , but may have started prior to the trip, not a lot of activity while he was down there , hx of meniscus tear, seems to be swelling in the back popliteal space, does get some numbness and tingling in the ball of the foot, no radiating pain , no MOI, has had occasional neuropathy, does get a painful locking clicking and popping, some times it better after a night of rest sometimes its not , tylenol for the pain and that helps a little , tried some biofreeze and that helped a little , painful going up and down steps has to go slow, has been using the elevator at work to decrease the pain    11/13/2021 Patient states that he feels better than last visit , doesn't feel great     01/15/2022 Patient states  Relevant Historical Information: History of left knee meniscal tear  Additional pertinent review of systems negative.   Current Outpatient Medications:    EPINEPHrine 0.3 mg/0.3 mL IJ SOAJ injection, , Disp: , Rfl:    fexofenadine (ALLEGRA) 180 MG tablet, TAKE 1 TABLET EVERY DAY AS NEEDED, Disp: , Rfl: 4   ibuprofen (ADVIL,MOTRIN) 200 MG tablet, Take 200 mg by mouth every 6 (six) hours as needed., Disp: , Rfl:    Multiple Vitamin (MULTIVITAMIN) tablet, Take 1 tablet by mouth daily., Disp: , Rfl:    tamsulosin (FLOMAX) 0.4 MG CAPS capsule, Take 1 capsule (0.4 mg total) by mouth daily., Disp: 90 capsule, Rfl: 3   Objective:     There were no vitals filed for this visit.     There is no height or weight on file to calculate BMI.    Physical Exam:    ***   Electronically signed by:  Brent Gordon D.[facility] 1:26 PM 01/14/22

## 2022-01-15 ENCOUNTER — Ambulatory Visit (INDEPENDENT_AMBULATORY_CARE_PROVIDER_SITE_OTHER): Payer: BC Managed Care – PPO | Admitting: Sports Medicine

## 2022-01-15 VITALS — BP 118/68 | HR 77 | Ht 70.0 in | Wt 259.0 lb

## 2022-01-15 DIAGNOSIS — M25562 Pain in left knee: Secondary | ICD-10-CM

## 2022-01-15 DIAGNOSIS — G8929 Other chronic pain: Secondary | ICD-10-CM | POA: Diagnosis not present

## 2022-01-15 DIAGNOSIS — M1712 Unilateral primary osteoarthritis, left knee: Secondary | ICD-10-CM | POA: Diagnosis not present

## 2022-01-15 NOTE — Patient Instructions (Addendum)
1 week follow up  Reach out to your insurance company and see how much it will cost you for HA , then we can decide between HA or PRP

## 2022-01-16 ENCOUNTER — Telehealth: Payer: Self-pay | Admitting: *Deleted

## 2022-01-16 NOTE — Telephone Encounter (Signed)
L knee visco initiated through portal.  

## 2022-01-16 NOTE — Telephone Encounter (Signed)
-----   Message from [facility], LAT sent at 01/15/2022  4:14 PM EST ----- HA injection L knee

## 2022-01-20 ENCOUNTER — Ambulatory Visit: Payer: BC Managed Care – PPO | Attending: Sports Medicine

## 2022-01-20 DIAGNOSIS — G8929 Other chronic pain: Secondary | ICD-10-CM | POA: Insufficient documentation

## 2022-01-20 DIAGNOSIS — M25562 Pain in left knee: Secondary | ICD-10-CM | POA: Diagnosis not present

## 2022-01-20 DIAGNOSIS — R293 Abnormal posture: Secondary | ICD-10-CM | POA: Diagnosis not present

## 2022-01-20 DIAGNOSIS — R2689 Other abnormalities of gait and mobility: Secondary | ICD-10-CM | POA: Insufficient documentation

## 2022-01-20 NOTE — Therapy (Signed)
OUTPATIENT PHYSICAL THERAPY LOWER EXTREMITY PROGRESS NOTE   Patient Name: Brent Gordon MRN: [id number] DOB:[birth year], 62 y.o., male Today's Date: 01/20/2022  END OF SESSION:  PT End of Session - 01/20/22 1648     Visit Number 4    Date for PT Re-Evaluation 02/12/22    Authorization Type BCBS    PT Start Time 7048    PT Stop Time 1650    PT Time Calculation (min) 33 min    Activity Tolerance Patient tolerated treatment well    Behavior During Therapy WFL for tasks assessed/performed                Past Medical History:  Diagnosis Date   Allergy    sees Dr. Lowella Grip   Obesity    Pain, low back    Sleep apnea    PSG 12/01/06 see's Dr Chesley Mires   Varicose veins    Past Surgical History:  Procedure Laterality Date   COLONOSCOPY  08/01/2019   Dr. Wilford Corner, diverticulosis, internal hemorrhoids, no polyps, repeat in 5 yrs   FOOT SURGERY Left 09/26/2011   triple arthrodesis per Dr. Wylene Simmer   HERNIA REPAIR  2005/2006   Dr. Zella Richer   leg vein ablation     right lower, Dr. Linus Mako   TONSILLECTOMY  01/13/1965   Patient Active Problem List   Diagnosis Date Noted   Concussion 10/16/2020   Knee pain 05/16/2016   OBSTRUCTIVE SLEEP APNEA 10/16/2006   Acute low back pain with radicular symptoms, duration less than 6 weeks 09/24/2006    PCP: Dr. Alysia Penna  REFERRING PROVIDER: Glennon Mac, DO  REFERRING DIAG: [phone] (ICD-10-CM) - Chronic pain of left knee M17.12 (ICD-10-CM) - Primary osteoarthritis of left knee M25.561,M25.562,G89.29 (ICD-10-CM) - Chronic pain of both knees  THERAPY DIAG:  Chronic pain of left knee  Other abnormalities of gait and mobility  Abnormal posture  Rationale for Evaluation and Treatment: Rehabilitation  ONSET DATE: Oct 2023  SUBJECTIVE:   SUBJECTIVE STATEMENT: I was able to get in the pool at [facility] and I can go when I want now.  I will get PRP this week.  My leg has locked up at times  over the past 2 weeks.   PERTINENT HISTORY: Acquired flat foot syndrome Left foot surgery 7 screws triple arthrodesis;  left knee meniscus repair 2 years ago Wears off the shelf orthotics Needs shoulder replacement Bil knee OA PAIN:  PAIN:  Are you having pain? Yes NPRS scale:  2/10 Pain location: Lt>Rt Pain orientation: Left  PAIN TYPE: aching, sharp, and catching Pain description: constant  Aggravating factors: prolonged sitting, walking > 1 mile (avg 9k steps/day) Relieving factors: elevate, walking short distances   PRECAUTIONS: None  WEIGHT BEARING RESTRICTIONS: No  FALLS:  Has patient fallen in last 6 months? 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 Tricompartmental osteoarthritic changes of the left knee, moderate-severe within the lateral compartment, progressed from prior. Small knee joint effusion. No soft tissue swelling.   IMPRESSION: Tricompartmental osteoarthritic changes of the left knee, moderate-severe within the lateral compartment, progressed from prior  PATIENT SURVEYS:  FOTO 57% goal  74%  COGNITION: Overall cognitive status: Within functional limits for tasks assessed     SENSATION: Lt tingling in foot  EDEMA:  Circumferential: Lt 45cm, Rt 44cm  MUSCLE LENGTH: Hamstrings: Right 75 deg; Left 75 deg  Lt ITB limited Lt quad limited 50%  POSTURE:  Lt knee valgus, Lt lateral trunk lean , Lt flat foot  PALPATION: Limited patellar mobility Lt compared to Rt with crepitus present Atrophy present bil quads with poor VMO activation Lt>Rt  LOWER EXTREMITY ROM:  Active ROM Right eval  Left eval  Hip flexion    Hip extension    Hip abduction    Hip adduction    Hip internal rotation    Hip external rotation    Knee flexion 125 121  Knee extension  0  Ankle dorsiflexion    Ankle plantarflexion    Ankle inversion    Ankle eversion     (Blank rows = not tested)  LOWER EXTREMITY MMT:  MMT Right eval Left eval  Hip flexion 4+ 4+  Hip extension 4 4  Hip abduction 4 4-  Hip adduction 4 4  Hip internal rotation 5 5  Hip external rotation 4+ 4+  Knee flexion 5 4+  Knee extension 4 3+  Ankle dorsiflexion 4+ 4  Ankle plantarflexion    Ankle inversion    Ankle eversion     (Blank rows = not tested)  LOWER EXTREMITY SPECIAL TESTS:  Knee special tests: Patellafemoral grind test: positive   FUNCTIONAL TESTS:    GAIT: Distance walked: within clinic to assess gait Assistive device utilized: None Level of assistance: Complete Independence Comments: Lt genu valgus, Lt lateral trunk lean, limited glut med activation in gait, arch collapse (Hx of aquired flat foot and Lt ankle surgery with hardward)   TODAY'S TREATMENT:        01/20/21: Nu-Step old model seat 12 L2 6 min while discussing status Seated knee flexion floor sliders with yellow loop 2x10 Standing rocker board x2 minutes  Seated green band HS isometric using floor slider 10 sec hold 10x Leg press seat 8 70# 20x bil; 30# left only 20x Hip machine 40#: abduction, extension and hip extension 10x each way right/left  Resisted cable walk 15# backwards only x10  01/07/22: Nu-Step old model seat 12 L2 6 min while discussing status Hold on LAQ secondary to painful clunk/crepitus Seated knee flexion floor sliders for ROM 20x Seated green band quad isometric using floor slider 10 sec hold 10x Seated green band HS isometric using floor slider 10 sec hold 10x Leg press seat 8 70# 20x bil; 30# left only 20x Hip machine 40#: abduction, extension and hip extension 10x each way right/left  Resisted cable walk  15# backwards only 7-8x (some discomfort)                                                                                                                DATE:  01/02/22: Nu-Step seat 12 L1 6 min while discussing status SKTC 4x left only  SLR 10x  Sidelying left hip abduction 10x Seated green band clams 10x Hold on LAQ secondary to painful clunk/crepitus Seated floor sliders 10x Seated green band quad isometric 10 sec hold 5x Seated green band HS isometric 10 sec hold 5x Wall sits isometric hold at 30 degrees 4x 10 sec holds Ball on wall hip abduction isometric 10 sec hold 4x right/left  12/18/21:  Initiated HEP Discussed evaluation findings  PATIENT EDUCATION:  Education details: Access Code: [email or web address] Person educated: Patient Education method: Programmer, multimedia, Facilities manager, Verbal cues, and Handouts Education comprehension: verbalized understanding and returned demonstration  HOME EXERCISE PROGRAM: Access Code: [email or web address] URL: [email or web address] Date: 01/02/2022 Prepared by: Lavinia Sharps  Exercises - Hooklying Single Knee to Chest Stretch  - 2 x daily - 7 x weekly - 1 sets - 3 reps - 20 hold - Quadricep Stretch with Chair and Counter Support  - 1 x daily - 7 x weekly - 1 sets - 3 reps - 20 hold - Standing Quad Stretch with Strap  - 1 x daily - 7 x weekly - 1 sets - 3 reps - 20 hold - Gastroc Stretch on Wall  - 1 x daily - 7 x weekly - 1 sets - 3 reps - 20 hold - Seated Hip Abduction with Resistance  - 1 x daily - 7 x weekly - 3 sets - 10 reps - Sitting Knee Extension with Resistance (Mirrored)  - 1 x daily - 7 x weekly - 1 sets - 10 reps - 10 hold - Seated Heel Slide (Mirrored)  - 1 x daily - 7 x weekly - 1 sets - 10 reps - Seated Hamstring Curl with Anchored Resistance  - 1 x daily - 7 x weekly - 1 sets - 10 reps - 10 hold - Wall Sit  - 1 x daily - 7 x weekly - 1 sets - 3-4 reps - 10 hold - Standing Isometric Hip Abduction with Ball on Wall  - 1 x daily - 7 x  weekly - 1 sets - 3-4 reps - 10 hold  ASSESSMENT:  CLINICAL IMPRESSION: Pt has gone to the pool to exercise since last session and responded well to this form of exercise.  Pt will have PRP injection into Lt knee on 01/22/22. Pt does well with closed chain exercises without increased pain. Pt plans to continue to go to the pool and [facility] gym for continued strength.   PT monitored pt for technique, pain and fatigue through session. Patient considering joining [employer] after rehab.     OBJECTIVE IMPAIRMENTS: Abnormal gait, decreased activity tolerance, decreased coordination, difficulty walking, decreased ROM, decreased strength, hypomobility, impaired flexibility, improper body mechanics, postural dysfunction, and pain.   ACTIVITY LIMITATIONS: carrying, sitting, standing, squatting, stairs, and locomotion level  PARTICIPATION LIMITATIONS: cleaning, driving, shopping, and community activity  PERSONAL FACTORS: 1-2 comorbidities: Lt ankle arthrodesis, tri-compartment OA of Lt knee  are also affecting patient's functional outcome.   REHAB POTENTIAL: Good  CLINICAL DECISION MAKING: Stable/uncomplicated  EVALUATION COMPLEXITY: Low   GOALS: Goals reviewed with patient? Yes  SHORT TERM GOALS: Target date: 01/15/22 Pt will be ind with initial HEP without exacerbation of symptoms Baseline: Goal status: MET  2.  Pt will demo improved Lt SL knee flexion to at least 110 deg for improved quad length Baseline: 90 deg Goal status: INITIAL  3.  Pt will demo improved quad recruitment throughout Lt quad (VMO definition) with loaded LAQ. Baseline:  Goal status: INITIAL  4.  Pt will  report reduced pain and catching incidence after sitting in car and at work by at least 30% Baseline:  Goal status: INITIAL    LONG TERM GOALS: Target date: 02/12/22  Pt will be ind with advanced HEP and understand how to manage symptoms. Baseline:  Goal status: INITIAL  2.  Pt will improve FOTO score to at least  74% to demo improved function. Baseline: 57% Goal status: INITIAL  3.  Pt will improve Lt hip abd and Lt knee strength to at least 4+/5 for improved gait and walking tolerance. Baseline:  Goal status: INITIAL  4.  Pt will improve Lt LE flexibility of quad, gluteals and ITB to WNL to reduce undue strain at knee Baseline:  Goal status: INITIAL  5.  Pt will report improved pain with walking 1 mile by at least 70% Baseline:  Goal status: INITIAL  6.  Pt will achieve Lt knee flexion of 125 deg for improved stairs. Baseline: 121 deg Goal status: INITIAL   PLAN:  PT FREQUENCY: 1x/week  PT DURATION: 8 weeks  PLANNED INTERVENTIONS: Therapeutic exercises, Therapeutic activity, Neuromuscular re-education, Balance training, Gait training, Patient/Family education, Self Care, Joint mobilization, Stair training, Aquatic Therapy, Dry Needling, Electrical stimulation, Cryotherapy, Moist heat, Taping, Vasopneumatic device, Ionotophoresis 4mg /ml Dexamethasone, and Manual therapy  PLAN FOR NEXT SESSION: NuStep, see how PRP injection went, progress hip and knee flexibility and strength (isometrically/closed chain); glute strengthening.  Check STGs  , PT 01/20/22 4:51 PM  Phone: [phone] Fax: [phone]

## 2022-01-21 NOTE — Progress Notes (Unsigned)
    Benito Mccreedy D.[facility] [place] Phone: [phone]   Assessment and Plan:     There are no diagnoses linked to this encounter.  ***   Pertinent previous records reviewed include ***   Follow Up: ***     Subjective:   I, Arek Spadafore, am serving as a Education administrator for Doctor Glennon Mac   Chief Complaint: left knee pain    HPI:    10/23/21 Patient is a 62 year old male complaining of left leg pain. Patient states he was seated for 4 hours , but may have started prior to the trip, not a lot of activity while he was down there , hx of meniscus tear, seems to be swelling in the back popliteal space, does get some numbness and tingling in the ball of the foot, no radiating pain , no MOI, has had occasional neuropathy, does get a painful locking clicking and popping, some times it better after a night of rest sometimes its not , tylenol for the pain and that helps a little , tried some biofreeze and that helped a little , painful going up and down steps has to go slow, has been using the elevator at work to decrease the pain    11/13/2021 Patient states that he feels better than last visit , doesn't feel great     01/15/2022 Patient states he has a few times of the knee locking up on him , other than that he is fine still able to walk his dog , thinks the locking up is weather related   01/22/2022 Patient states   Relevant Historical Information: History of left knee meniscal tea  Additional pertinent review of systems negative.   Current Outpatient Medications:    EPINEPHrine 0.3 mg/0.3 mL IJ SOAJ injection, , Disp: , Rfl:    fexofenadine (ALLEGRA) 180 MG tablet, TAKE 1 TABLET EVERY DAY AS NEEDED, Disp: , Rfl: 4   ibuprofen (ADVIL,MOTRIN) 200 MG tablet, Take 200 mg by mouth every 6 (six) hours as needed., Disp: , Rfl:    Multiple Vitamin (MULTIVITAMIN) tablet, Take 1 tablet by mouth daily., Disp: , Rfl:     tamsulosin (FLOMAX) 0.4 MG CAPS capsule, Take 1 capsule (0.4 mg total) by mouth daily., Disp: 90 capsule, Rfl: 3   Objective:     There were no vitals filed for this visit.    There is no height or weight on file to calculate BMI.    Physical Exam:    ***   Electronically signed by:  Benito Mccreedy D.[facility] 12:38 PM 01/21/22

## 2022-01-22 ENCOUNTER — Ambulatory Visit: Payer: Self-pay | Admitting: Sports Medicine

## 2022-01-22 VITALS — BP 124/78 | HR 74 | Ht 70.0 in | Wt 249.0 lb

## 2022-01-22 DIAGNOSIS — M1712 Unilateral primary osteoarthritis, left knee: Secondary | ICD-10-CM

## 2022-01-22 DIAGNOSIS — G8929 Other chronic pain: Secondary | ICD-10-CM

## 2022-01-22 NOTE — Patient Instructions (Signed)
Good to see you  Tylenol only for the first 3 days No ice for the first 3 days

## 2022-01-27 NOTE — Telephone Encounter (Signed)
L knee Gelsyn approved. No pre-cert required.  Deductible must be met before coverage applies. Once OOP is met, coverage is 100%.

## 2022-01-28 ENCOUNTER — Ambulatory Visit: Payer: BC Managed Care – PPO | Admitting: Physical Therapy

## 2022-01-28 DIAGNOSIS — R2689 Other abnormalities of gait and mobility: Secondary | ICD-10-CM

## 2022-01-28 DIAGNOSIS — G8929 Other chronic pain: Secondary | ICD-10-CM

## 2022-01-28 DIAGNOSIS — M25562 Pain in left knee: Secondary | ICD-10-CM | POA: Diagnosis not present

## 2022-01-28 DIAGNOSIS — R293 Abnormal posture: Secondary | ICD-10-CM | POA: Diagnosis not present

## 2022-01-28 NOTE — Therapy (Signed)
OUTPATIENT PHYSICAL THERAPY LOWER EXTREMITY PROGRESS NOTE   Patient Name: Brent Gordon MRN: [id number] DOB:1960/07/23, 62 y.o., male Today's Date: 01/28/2022  END OF SESSION:  PT End of Session - 01/28/22 1612     Visit Number 5    Date for PT Re-Evaluation 02/12/22    Authorization Type BCBS    PT Start Time 1615    PT Stop Time 1654    PT Time Calculation (min) 39 min    Activity Tolerance Patient tolerated treatment well                Past Medical History:  Diagnosis Date   Allergy    sees Dr. Lowella Grip   Obesity    Pain, low back    Sleep apnea    PSG 12/01/06 see's Dr Chesley Mires   Varicose veins    Past Surgical History:  Procedure Laterality Date   COLONOSCOPY  08/01/2019   Dr. Wilford Corner, diverticulosis, internal hemorrhoids, no polyps, repeat in 5 yrs   FOOT SURGERY Left 09/26/2011   triple arthrodesis per Dr. Wylene Simmer   HERNIA REPAIR  2005/2006   Dr. Zella Richer   leg vein ablation     right lower, Dr. Linus Mako   TONSILLECTOMY  01/13/1965   Patient Active Problem List   Diagnosis Date Noted   Concussion 10/16/2020   Knee pain 05/16/2016   OBSTRUCTIVE SLEEP APNEA 10/16/2006   Acute low back pain with radicular symptoms, duration less than 6 weeks 09/24/2006    PCP: Dr. Alysia Penna  REFERRING PROVIDER: Glennon Mac, DO  REFERRING DIAG: [phone] (ICD-10-CM) - Chronic pain of left knee M17.12 (ICD-10-CM) - Primary osteoarthritis of left knee M25.561,M25.562,G89.29 (ICD-10-CM) - Chronic pain of both knees  THERAPY DIAG:  Chronic pain of left knee  Other abnormalities of gait and mobility  Rationale for Evaluation and Treatment: Rehabilitation  ONSET DATE: Oct 2023  SUBJECTIVE:   SUBJECTIVE STATEMENT: Got PRP last week.  Seems to have helped.  Been swimming 864m backstroke rather than freestyle (bothers shoulder).  I did the leg press at the gym.    PERTINENT HISTORY: Acquired flat foot syndrome Left  foot surgery 7 screws triple arthrodesis;  left knee meniscus repair 2 years ago Wears off the shelf orthotics Needs shoulder replacement Bil knee OA PAIN:  PAIN:  Are you having pain? Yes NPRS scale:  2/10 Pain location: Lt>Rt Pain orientation: Left  PAIN TYPE: aching, sharp, and catching Pain description: constant  Aggravating factors: prolonged sitting, walking > 1 mile (avg 9k steps/day) Relieving factors: elevate, walking short distances   PRECAUTIONS: None  WEIGHT BEARING RESTRICTIONS: No  FALLS:  Has patient fallen in last 6 months? 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 Tricompartmental osteoarthritic changes of the left knee, moderate-severe within the lateral compartment, progressed from prior. Small knee joint effusion. No soft tissue swelling.   IMPRESSION: Tricompartmental osteoarthritic changes of the left knee, moderate-severe within the lateral compartment, progressed from prior  PATIENT SURVEYS:  FOTO 57% goal 74%  COGNITION: Overall cognitive status: Within functional limits for tasks assessed     SENSATION: Lt tingling in foot  EDEMA:  Circumferential: Lt 45cm, Rt 44cm  MUSCLE LENGTH: Hamstrings: Right 75 deg; Left 75 deg  Lt ITB limited Lt quad limited 50%  POSTURE:  Lt knee valgus, Lt lateral trunk lean , Lt flat foot  PALPATION: Limited patellar mobility Lt compared to Rt with crepitus present Atrophy present bil quads with poor VMO activation Lt>Rt  LOWER EXTREMITY ROM:  Active ROM Right eval Left eval  Hip flexion    Hip extension    Hip abduction    Hip adduction     Hip internal rotation    Hip external rotation    Knee flexion 125 121  Knee extension  0  Ankle dorsiflexion    Ankle plantarflexion    Ankle inversion    Ankle eversion     (Blank rows = not tested)  LOWER EXTREMITY MMT:  MMT Right eval Left eval  Hip flexion 4+ 4+  Hip extension 4 4  Hip abduction 4 4-  Hip adduction 4 4  Hip internal rotation 5 5  Hip external rotation 4+ 4+  Knee flexion 5 4+  Knee extension 4 3+  Ankle dorsiflexion 4+ 4  Ankle plantarflexion    Ankle inversion    Ankle eversion     (Blank rows = not tested)  LOWER EXTREMITY SPECIAL TESTS:  Knee special tests: Patellafemoral grind test: positive   FUNCTIONAL TESTS:    GAIT: Distance walked: within clinic to assess gait Assistive device utilized: None Level of assistance: Complete Independence Comments: Lt genu valgus, Lt lateral trunk lean, limited glut med activation in gait, arch collapse (Hx of aquired flat foot and Lt ankle surgery with hardward)   TODAY'S TREATMENT:        1/16: Nu-Step new model seat 12 L4  6 min while discussing status Seated knee extension isometric floor sliders with green band isometric x10 10 sec hold Seated green band HS isometric using floor slider 10 sec hold 10x Leg press seat 8 70# 20x bil; 35# left only 20x Hip machine 40#: abduction, extension and hip flexion 10x each way right/left  Resisted cable walk 15# backwards only x10 Review of gym program to replicate      05/18/41: Nu-Step old model seat 12 L2 6 min while discussing status Seated knee flexion floor sliders with yellow loop 2x10 Standing rocker board x2 minutes  Seated green band HS isometric using floor slider 10 sec hold 10x Leg press seat 8 70# 20x bil; 30# left only 20x Hip machine 40#: abduction, extension and hip extension 10x each way right/left  Resisted cable walk 15# backwards only x10  01/07/22: Nu-Step old model seat 12 L2 6 min while discussing status Hold on LAQ secondary  to painful clunk/crepitus Seated knee flexion floor sliders for ROM 20x Seated green band quad isometric using floor slider 10 sec hold 10x Seated green band HS isometric using floor slider 10 sec hold 10x Leg press seat 8 70# 20x bil; 30# left only 20x Hip machine 40#: abduction, extension and hip extension 10x each way right/left  Resisted cable walk 15# backwards only 7-8x (some discomfort)    PATIENT EDUCATION:  Education details: Access Code: [email or web address] Person educated: Patient Education method: Consulting civil engineer, Demonstration, Verbal cues, and Handouts Education comprehension: verbalized understanding and returned demonstration  HOME EXERCISE PROGRAM: Access Code: [email or web address] URL: [email or web address] Date: 01/02/2022 Prepared by: Ruben Im  Exercises - Hooklying Single Knee to Chest Stretch  - 2 x daily - 7 x weekly - 1 sets - 3 reps -  20 hold - Theatre manager with Chair and Counter Support  - 1 x daily - 7 x weekly - 1 sets - 3 reps - 20 hold - Standing Quad Stretch with Strap  - 1 x daily - 7 x weekly - 1 sets - 3 reps - 20 hold - Gastroc Stretch on Wall  - 1 x daily - 7 x weekly - 1 sets - 3 reps - 20 hold - Seated Hip Abduction with Resistance  - 1 x daily - 7 x weekly - 3 sets - 10 reps - Sitting Knee Extension with Resistance (Mirrored)  - 1 x daily - 7 x weekly - 1 sets - 10 reps - 10 hold - Seated Heel Slide (Mirrored)  - 1 x daily - 7 x weekly - 1 sets - 10 reps - Seated Hamstring Curl with Anchored Resistance  - 1 x daily - 7 x weekly - 1 sets - 10 reps - 10 hold - Wall Sit  - 1 x daily - 7 x weekly - 1 sets - 3-4 reps - 10 hold - Standing Isometric Hip Abduction with Ball on Wall  - 1 x daily - 7 x weekly - 1 sets - 3-4 reps - 10 hold  ASSESSMENT:  CLINICAL IMPRESSION: Good response in pain and reduced crepitus following PRP.  He is able to continue with strengthening with focus on quads, HS and gluteals.  Therapist monitoring response and modifying  challenge level/intensity as needed.  He reports mild muscle soreness consistent with exercise.    OBJECTIVE IMPAIRMENTS: Abnormal gait, decreased activity tolerance, decreased coordination, difficulty walking, decreased ROM, decreased strength, hypomobility, impaired flexibility, improper body mechanics, postural dysfunction, and pain.   ACTIVITY LIMITATIONS: carrying, sitting, standing, squatting, stairs, and locomotion level  PARTICIPATION LIMITATIONS: cleaning, driving, shopping, and community activity  PERSONAL FACTORS: 1-2 comorbidities: Lt ankle arthrodesis, tri-compartment OA of Lt knee  are also affecting patient's functional outcome.   REHAB POTENTIAL: Good  CLINICAL DECISION MAKING: Stable/uncomplicated  EVALUATION COMPLEXITY: Low   GOALS: Goals reviewed with patient? Yes  SHORT TERM GOALS: Target date: 01/15/22 Pt will be ind with initial HEP without exacerbation of symptoms Baseline: Goal status: MET  2.  Pt will demo improved Lt SL knee flexion to at least 110 deg for improved quad length Baseline: 90 deg Goal status: met 1/16  3.  Pt will demo improved quad recruitment throughout Lt quad (VMO definition) with loaded LAQ. Baseline:  Goal status: ongoing 4.  Pt will report reduced pain and catching incidence after sitting in car and at work by at least 30% Baseline:  Goal status: met 1/16   LONG TERM GOALS: Target date: 02/12/22  Pt will be ind with advanced HEP and understand how to manage symptoms. Baseline:  Goal status: INITIAL  2.  Pt will improve FOTO score to at least 74% to demo improved function. Baseline: 57% Goal status: INITIAL  3.  Pt will improve Lt hip abd and Lt knee strength to at least 4+/5 for improved gait and walking tolerance. Baseline:  Goal status: INITIAL  4.  Pt will improve Lt LE flexibility of quad, gluteals and ITB to WNL to reduce undue strain at knee Baseline:  Goal status: INITIAL  5.  Pt will report improved pain with  walking 1 mile by at least 70% Baseline:  Goal status: INITIAL  6.  Pt will achieve Lt knee flexion of 125 deg for improved stairs. Baseline: 121 deg Goal status: INITIAL  PLAN:  PT FREQUENCY: 1x/week  PT DURATION: 8 weeks  PLANNED INTERVENTIONS: Therapeutic exercises, Therapeutic activity, Neuromuscular re-education, Balance training, Gait training, Patient/Family education, Self Care, Joint mobilization, Stair training, Aquatic Therapy, Dry Needling, Electrical stimulation, Cryotherapy, Moist heat, Taping, Vasopneumatic device, Ionotophoresis 4mg /ml Dexamethasone, and Manual therapy  PLAN FOR NEXT SESSION: NuStep,  progress hip and knee flexibility and strength (isometrically/closed chain); glute strengthening.   Discuss seated extension machine at the gym (isometrically)  Ruben Im, PT 01/28/22 5:03 PM Phone: [phone] Fax: [phone]

## 2022-01-30 DIAGNOSIS — G4733 Obstructive sleep apnea (adult) (pediatric): Secondary | ICD-10-CM | POA: Diagnosis not present

## 2022-02-04 ENCOUNTER — Ambulatory Visit: Payer: BC Managed Care – PPO | Admitting: Physical Therapy

## 2022-02-12 ENCOUNTER — Ambulatory Visit: Payer: BC Managed Care – PPO

## 2022-02-12 DIAGNOSIS — J3081 Allergic rhinitis due to animal (cat) (dog) hair and dander: Secondary | ICD-10-CM | POA: Diagnosis not present

## 2022-02-12 DIAGNOSIS — R293 Abnormal posture: Secondary | ICD-10-CM | POA: Diagnosis not present

## 2022-02-12 DIAGNOSIS — J3089 Other allergic rhinitis: Secondary | ICD-10-CM | POA: Diagnosis not present

## 2022-02-12 DIAGNOSIS — M25562 Pain in left knee: Secondary | ICD-10-CM | POA: Diagnosis not present

## 2022-02-12 DIAGNOSIS — R2689 Other abnormalities of gait and mobility: Secondary | ICD-10-CM

## 2022-02-12 DIAGNOSIS — G8929 Other chronic pain: Secondary | ICD-10-CM | POA: Diagnosis not present

## 2022-02-12 DIAGNOSIS — J301 Allergic rhinitis due to pollen: Secondary | ICD-10-CM | POA: Diagnosis not present

## 2022-02-12 NOTE — Therapy (Addendum)
OUTPATIENT PHYSICAL THERAPY LOWER EXTREMITY PROGRESS NOTE   Patient Name: Brent Gordon MRN: [id number] DOB:January 06, 1961, 62 y.o., male Today's Date: 02/12/2022  END OF SESSION:  PT End of Session - 02/12/22 1603     Visit Number 6    Date for PT Re-Evaluation 03/26/22    Authorization Type BC[employer]    PT Start Time 1532    PT Stop Time 1606    PT Time Calculation (min) 34 min    Activity Tolerance Patient tolerated treatment well    Behavior During Therapy WFL for tasks assessed/performed                 Past Medical History:  Diagnosis Date   Allergy    sees Dr. Lowella Grip   Obesity    Pain, low back    Sleep apnea    PSG 12/01/06 see's Dr Chesley Mires   Varicose veins    Past Surgical History:  Procedure Laterality Date   COLONOSCOPY  08/01/2019   Dr. Wilford Corner, diverticulosis, internal hemorrhoids, no polyps, repeat in 5 yrs   FOOT SURGERY Left 09/26/2011   triple arthrodesis per Dr. Wylene Simmer   HERNIA REPAIR  2005/2006   Dr. Zella Richer   leg vein ablation     right lower, Dr. Linus Mako   TONSILLECTOMY  01/13/1965   Patient Active Problem List   Diagnosis Date Noted   Concussion 10/16/2020   Knee pain 05/16/2016   OBSTRUCTIVE SLEEP APNEA 10/16/2006   Acute low back pain with radicular symptoms, duration less than 6 weeks 09/24/2006    PCP: Dr. Alysia Penna  REFERRING PROVIDER: Glennon Mac, DO  REFERRING DIAG: [phone] (ICD-10-CM) - Chronic pain of left knee M17.12 (ICD-10-CM) - Primary osteoarthritis of left knee M25.561,M25.562,G89.29 (ICD-10-CM) - Chronic pain of both knees  THERAPY DIAG:  Chronic pain of left knee - Plan: PT plan of care cert/re-cert  Other abnormalities of gait and mobility - Plan: PT plan of care cert/re-cert  Rationale for Evaluation and Treatment: Rehabilitation  ONSET DATE: Oct 2023  SUBJECTIVE:   SUBJECTIVE STATEMENT: I think PRP has helped.  We've had so much bad weather that I have been  sore from that.  I've been swimming a lot at the gym.   I feel 40-50% overall improvement in symptoms.  PERTINENT HISTORY: Acquired flat foot syndrome Left foot surgery 7 screws triple arthrodesis;  left knee meniscus repair 2 years ago Wears off the shelf orthotics Needs shoulder replacement Bil knee OA PAIN:  PAIN:  Are you having pain? Yes NPRS scale:  2/10 Pain location: Lt>Rt Pain orientation: Left  PAIN TYPE: aching, sharp, and catching Pain description: constant  Aggravating factors: prolonged sitting, walking > 1 mile (avg 9k steps/day) Relieving factors: elevate, walking short distances   PRECAUTIONS: None  WEIGHT BEARING RESTRICTIONS: No  FALLS:  Has patient fallen in last 6 months? 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 Tricompartmental osteoarthritic changes of the left knee, moderate-severe within the lateral compartment, progressed from prior. Small knee joint effusion. No soft tissue swelling.   IMPRESSION: Tricompartmental osteoarthritic changes of the left knee, moderate-severe within the lateral compartment,  progressed from prior  PATIENT SURVEYS:  FOTO 57% goal 74% 02/12/22: FOTO 58   COGNITION: Overall cognitive status: Within functional limits for tasks assessed     SENSATION: Lt tingling in foot  EDEMA:  Circumferential: Lt 45cm, Rt 44cm  MUSCLE LENGTH: Hamstrings: Right 75 deg; Left 75 deg  Lt ITB limited Lt quad limited 50%  POSTURE:  Lt knee valgus, Lt lateral trunk lean , Lt flat foot  PALPATION: Limited patellar mobility Lt compared to Rt with crepitus present Atrophy  present bil quads with poor VMO activation Lt>Rt  LOWER EXTREMITY ROM:  Active ROM Right eval Left eval  Hip flexion    Hip extension    Hip abduction    Hip adduction    Hip internal rotation    Hip external rotation    Knee flexion 125 121  Knee extension  0  Ankle dorsiflexion    Ankle plantarflexion    Ankle inversion    Ankle eversion     (Blank rows = not tested)  LOWER EXTREMITY MMT:  MMT Right eval Left eval Left 02/12/22  Hip flexion 4+ 4+ 4+  Hip extension 4 4   Hip abduction 4 4- 4-  Hip adduction 4 4   Hip internal rotation 5 5   Hip external rotation 4+ 4+   Knee flexion 5 4+ 4+  Knee extension 4 3+ 4+  Ankle dorsiflexion 4+ 4   Ankle plantarflexion     Ankle inversion     Ankle eversion      (Blank rows = not tested)  LOWER EXTREMITY SPECIAL TESTS:  Knee special tests: Patellafemoral grind test: positive   FUNCTIONAL TESTS:    GAIT: Distance walked: within clinic to assess gait Assistive device utilized: None Level of assistance: Complete Independence Comments: Lt genu valgus, Lt lateral trunk lean, limited glut med activation in gait, arch collapse (Hx of aquired flat foot and Lt ankle surgery with hardward)   TODAY'S TREATMENT:        1/31: Nu-Step new model seat 12 L4  6 min while discussing status Seated knee extension isometric floor sliders with green band isometric x10 10 sec hold Seated green band HS isometric using floor slider 10 sec hold 10x Leg press seat 8 70# 20x bil; 35# left only 20x Hip machine 40#: abduction, extension and hip flexion 10x each way right/left  Resisted cable walk 15# backwards only x10 Review of gym program to replicate   [id number]: Nu-Step new model seat 12 L4  6 min while discussing status Leg press seat 8 70# 20x bil; 35# left only 20x Hip machine 40#: abduction, extension and hip flexion 10x each way right/left  Resisted cable walk 15# backwards only x10   01/20/21: Nu-Step old model seat 12 L2 6 min  while discussing status Seated knee flexion floor sliders with yellow loop 2x10 Standing rocker board x2 minutes  Seated green band HS isometric using floor slider 10 sec hold 10x Leg press seat 8 70# 20x bil; 30# left only 20x Hip machine 40#: abduction, extension and hip extension 10x each way right/left  Resisted cable walk 15# backwards only x10     PATIENT EDUCATION:  Education details: Access Code: [email or web address] Person educated: Patient Education method: Consulting civil engineer, Media planner, Verbal cues, and Handouts Education comprehension: verbalized understanding and returned demonstration  HOME EXERCISE PROGRAM: Access Code: [email or web address] URL: [email or web address] Date: 01/02/2022 Prepared by: Ruben Im  Exercises - Hooklying Single Knee to Chest Stretch  - 2 x daily -  7 x weekly - 1 sets - 3 reps - 20 hold - Quadricep Stretch with Chair and Counter Support  - 1 x daily - 7 x weekly - 1 sets - 3 reps - 20 hold - Standing Quad Stretch with Strap  - 1 x daily - 7 x weekly - 1 sets - 3 reps - 20 hold - Gastroc Stretch on Wall  - 1 x daily - 7 x weekly - 1 sets - 3 reps - 20 hold - Seated Hip Abduction with Resistance  - 1 x daily - 7 x weekly - 3 sets - 10 reps - Sitting Knee Extension with Resistance (Mirrored)  - 1 x daily - 7 x weekly - 1 sets - 10 reps - 10 hold - Seated Heel Slide (Mirrored)  - 1 x daily - 7 x weekly - 1 sets - 10 reps - Seated Hamstring Curl with Anchored Resistance  - 1 x daily - 7 x weekly - 1 sets - 10 reps - 10 hold - Wall Sit  - 1 x daily - 7 x weekly - 1 sets - 3-4 reps - 10 hold - Standing Isometric Hip Abduction with Ball on Wall  - 1 x daily - 7 x weekly - 1 sets - 3-4 reps - 10 hold  ASSESSMENT:  CLINICAL IMPRESSION: Good response in pain and reduced crepitus following PRP.  Pt reports 40-50% overall improvement in knee symptoms since the start of care. Weather changes have impacted pain over the past couple of weeks.   He is able to continue  with strengthening with focus on quads, HS and gluteals.  Pt has been going to the gym and swimming there.  He plans to incorporate  weight training into his routine there. Lt quad strength is improved.  He continues to have functional strength deficits him hip abductors. Therapist monitoring response and modifying challenge level/intensity as needed.  Pt will likely attend 1-3 more sessions and plan D/C to HEP and gym exercises.    OBJECTIVE IMPAIRMENTS: Abnormal gait, decreased activity tolerance, decreased coordination, difficulty walking, decreased ROM, decreased strength, hypomobility, impaired flexibility, improper body mechanics, postural dysfunction, and pain.   ACTIVITY LIMITATIONS: carrying, sitting, standing, squatting, stairs, and locomotion level  PARTICIPATION LIMITATIONS: cleaning, driving, shopping, and community activity  PERSONAL FACTORS: 1-2 comorbidities: Lt ankle arthrodesis, tri-compartment OA of Lt knee  are also affecting patient's functional outcome.   REHAB POTENTIAL: Good  CLINICAL DECISION MAKING: Stable/uncomplicated  EVALUATION COMPLEXITY: Low   GOALS: Goals reviewed with patient? Yes  SHORT TERM GOALS: Target date: 01/15/22 Pt will be ind with initial HEP without exacerbation of symptoms Baseline: Goal status: MET  2.  Pt will demo improved Lt SL knee flexion to at least 110 deg for improved quad length Baseline: 90 deg Goal status: met 1/16  3.  Pt will demo improved quad recruitment throughout Lt quad (VMO definition) with loaded LAQ. Baseline:  Goal status: ongoing 4.  Pt will report reduced pain and catching incidence after sitting in car and at work by at least 30% Baseline: 40-50% (02/12/22) Goal status: met 1/16   LONG TERM GOALS: Target date: 03/26/22  Pt will be ind with advanced HEP and understand how to manage symptoms. Baseline: further advancement of gym program needed  Goal status: In progress   2.  Pt will improve FOTO score to at  least 74% to demo improved function. Baseline: 58 (02/12/22) Goal status: in progress   3.  Pt will improve  Lt hip abd and Lt knee strength to at least 4+/5 for improved gait and walking tolerance. Baseline: abd 4-/5, Lt knee extension 4+/5 Goal status: INITIAL  4.  Pt will improve Lt LE flexibility of quad, gluteals and ITB to WNL to reduce undue strain at knee Baseline:  Goal status: In progress   5.  Pt will report improved pain with walking 1 mile by at least 70% Baseline: 40-50% (02/12/22) Goal status: in progress   6.  Pt will achieve Lt knee flexion of 125 deg for improved stairs. Baseline: 121 deg Goal status: In progress    PLAN:  PT FREQUENCY: 1x/week  PT DURATION: 6 weeks   PLANNED INTERVENTIONS: Therapeutic exercises, Therapeutic activity, Neuromuscular re-education, Balance training, Gait training, Patient/Family education, Self Care, Joint mobilization, Stair training, Aquatic Therapy, Dry Needling, Electrical stimulation, Cryotherapy, Moist heat, Taping, Vasopneumatic device, Ionotophoresis 4mg /ml Dexamethasone, and Manual therapy  PLAN FOR NEXT SESSION: NuStep,  progress hip and knee flexibility and strength (isometrically/closed chain); glute strengthening.   Discuss seated extension machine at the gym (isometrically).  See what MD says. 1-3 more sessions probable.   Sigurd Sos, PT 02/12/22 4:07 PM   PHYSICAL THERAPY DISCHARGE SUMMARY  Visits from Start of Care: 6  Current functional level related to goals / functional outcomes: See above for current status.  MD agreed for pt to continue with HEP.    Remaining deficits: See above   Education / Equipment: HEP   Patient agrees to discharge. Patient goals were partially met. Patient is being discharged due to being pleased with the current functional level.  Sigurd Sos, PT 04/10/22 6:03 PM   Phone: [phone] Fax: [phone]

## 2022-02-18 NOTE — Progress Notes (Unsigned)
    Brent Gordon D.[place] Phone: [phone]   Assessment and Plan:     There are no diagnoses linked to this encounter.  ***   Pertinent previous records reviewed include ***   Follow Up: ***     Subjective:   I, Brent Gordon, am serving as a Education administrator for Doctor Glennon Mac   Chief Complaint: left knee pain    HPI:    10/23/21 Patient is a 62 year old male complaining of left leg pain. Patient states he was seated for 4 hours , but may have started prior to the trip, not a lot of activity while he was down there , hx of meniscus tear, seems to be swelling in the back popliteal space, does get some numbness and tingling in the ball of the foot, no radiating pain , no MOI, has had occasional neuropathy, does get a painful locking clicking and popping, some times it better after a night of rest sometimes its not , tylenol for the pain and that helps a little , tried some biofreeze and that helped a little , painful going up and down steps has to go slow, has been using the elevator at work to decrease the pain    11/13/2021 Patient states that he feels better than last visit , doesn't feel great     01/15/2022 Patient states he has a few times of the knee locking up on him , other than that he is fine still able to walk his dog , thinks the locking up is weather related    01/22/2022 Patient states felt great the beginning of the week and then the weather changed and he had some pain but better now   02/19/2022 Patient states    Relevant Historical Information: History of left knee meniscal tea  Additional pertinent review of systems negative.   Current Outpatient Medications:    EPINEPHrine 0.3 mg/0.3 mL IJ SOAJ injection, , Disp: , Rfl:    fexofenadine (ALLEGRA) 180 MG tablet, TAKE 1 TABLET EVERY DAY AS NEEDED, Disp: , Rfl: 4   ibuprofen (ADVIL,MOTRIN) 200 MG tablet, Take 200 mg by mouth every  6 (six) hours as needed., Disp: , Rfl:    Multiple Vitamin (MULTIVITAMIN) tablet, Take 1 tablet by mouth daily., Disp: , Rfl:    tamsulosin (FLOMAX) 0.4 MG CAPS capsule, Take 1 capsule (0.4 mg total) by mouth daily., Disp: 90 capsule, Rfl: 3   Objective:     There were no vitals filed for this visit.    There is no height or weight on file to calculate BMI.    Physical Exam:    ***   Electronically signed by:  Brent Gordon D.[facility] 12:50 PM 02/18/22

## 2022-02-19 ENCOUNTER — Ambulatory Visit (INDEPENDENT_AMBULATORY_CARE_PROVIDER_SITE_OTHER): Payer: BC Managed Care – PPO | Admitting: Sports Medicine

## 2022-02-19 VITALS — BP 110/80 | HR 69 | Ht 70.0 in | Wt 243.0 lb

## 2022-02-19 DIAGNOSIS — G8929 Other chronic pain: Secondary | ICD-10-CM

## 2022-02-19 DIAGNOSIS — M1712 Unilateral primary osteoarthritis, left knee: Secondary | ICD-10-CM

## 2022-02-19 DIAGNOSIS — M25562 Pain in left knee: Secondary | ICD-10-CM

## 2022-02-19 NOTE — Patient Instructions (Addendum)
Good to see you  Tylenol [phone] mg 2-3 times a day for pain relief  Recommend no NSAID for an additional 2 weeks And after can start using as needed Continue gradual progression of physical activity  As needed follow up if no improvement 4 week follow up

## 2022-03-02 DIAGNOSIS — G4733 Obstructive sleep apnea (adult) (pediatric): Secondary | ICD-10-CM | POA: Diagnosis not present

## 2022-03-14 ENCOUNTER — Encounter: Payer: Self-pay | Admitting: Family Medicine

## 2022-03-14 DIAGNOSIS — E785 Hyperlipidemia, unspecified: Secondary | ICD-10-CM

## 2022-03-17 NOTE — Telephone Encounter (Signed)
He has orders for an A1c and lipids on hold. He can just make a lab appt for these to be drawn

## 2022-03-25 DIAGNOSIS — J3089 Other allergic rhinitis: Secondary | ICD-10-CM | POA: Diagnosis not present

## 2022-03-30 DIAGNOSIS — G4733 Obstructive sleep apnea (adult) (pediatric): Secondary | ICD-10-CM | POA: Diagnosis not present

## 2022-03-31 NOTE — Telephone Encounter (Signed)
Pt has been scheduled for Labs on 04/21/22. Pt would like to A1C to his Lab order. Pt is coming in to do a Lipid Panel, as well.

## 2022-03-31 NOTE — Telephone Encounter (Signed)
Patient was notified on 03/17/22 of A1C lab order placed, also Lipid panel.   Patient acknowledgment  below.

## 2022-04-02 DIAGNOSIS — G4733 Obstructive sleep apnea (adult) (pediatric): Secondary | ICD-10-CM | POA: Diagnosis not present

## 2022-04-17 DIAGNOSIS — J3089 Other allergic rhinitis: Secondary | ICD-10-CM | POA: Diagnosis not present

## 2022-04-21 ENCOUNTER — Other Ambulatory Visit (INDEPENDENT_AMBULATORY_CARE_PROVIDER_SITE_OTHER): Payer: BC Managed Care – PPO

## 2022-04-21 ENCOUNTER — Ambulatory Visit (INDEPENDENT_AMBULATORY_CARE_PROVIDER_SITE_OTHER): Payer: BC Managed Care – PPO | Admitting: Sports Medicine

## 2022-04-21 ENCOUNTER — Ambulatory Visit (INDEPENDENT_AMBULATORY_CARE_PROVIDER_SITE_OTHER): Payer: BC Managed Care – PPO

## 2022-04-21 VITALS — BP 132/80 | HR 73 | Ht 70.0 in | Wt 228.0 lb

## 2022-04-21 DIAGNOSIS — E785 Hyperlipidemia, unspecified: Secondary | ICD-10-CM | POA: Diagnosis not present

## 2022-04-21 DIAGNOSIS — M25532 Pain in left wrist: Secondary | ICD-10-CM

## 2022-04-21 DIAGNOSIS — S62022A Displaced fracture of middle third of navicular [scaphoid] bone of left wrist, initial encounter for closed fracture: Secondary | ICD-10-CM | POA: Diagnosis not present

## 2022-04-21 DIAGNOSIS — R7309 Other abnormal glucose: Secondary | ICD-10-CM

## 2022-04-21 LAB — LIPID PANEL
Cholesterol: 112 mg/dL (ref 0–200)
HDL: 40.1 mg/dL (ref >39.00)
LDL Cholesterol: 60 mg/dL (ref 0–99)
NonHDL: 71.69
Total CHOL/HDL Ratio: 3
Triglycerides: 57 mg/dL (ref 0.0–149.0)
VLDL: 11.4 mg/dL (ref 0.0–40.0)

## 2022-04-21 LAB — HEMOGLOBIN A1C: Hgb A1c MFr Bld: 5.7 % (ref 4.6–6.5)

## 2022-04-21 NOTE — Addendum Note (Signed)
Addended by: MAZARIEGOS-SANCHEZ, Trinidy Masterson A on: 04/21/2022 08:32 AM   Modules accepted: Orders  

## 2022-04-21 NOTE — Addendum Note (Signed)
Addended by: Donald Pore A on: 04/21/2022 08:32 AM   Modules accepted: Orders

## 2022-04-21 NOTE — Addendum Note (Signed)
Addended by: MAZARIEGOS-SANCHEZ, Jacqueline Spofford A on: 04/21/2022 08:32 AM   Modules accepted: Orders  

## 2022-04-21 NOTE — Patient Instructions (Addendum)
Good to see you  Tylenol [phone] mg 2-3 times a day for pain relief  Brace at all times  Ortho referral  RICE rest ice compression elevation  As needed follow up

## 2022-04-21 NOTE — Progress Notes (Signed)
    Aleen Sells D.[facility] [address] [place] [address] Phone: [phone]   Assessment and Plan:     1. Left wrist pain 2. Displaced fracture of middle third of scaphoid bone of left wrist, initial encounter for closed fracture  -Acute, complicated, initial sports medicine visit - Closed fracture of middle third scaphoid of left wrist with mild displacement caused by FOOSH injury yesterday, 04/20/2022 - X-ray obtained in clinic.  My interpretation:Closed fracture of middle third scaphoid of left wrist with mild displacement.  Soft tissue edema - Patient placed in thumb spica brace at today's visit - Patient to be nonweightbearing with left upper extremity - We will refer to orthopedic surgery for evaluation, ideally this week. - Recommend using Tylenol 500 to 1000 mg to 3 times a day for pain relief.  If this does not sufficiently control pain, patient can call our clinic and I would prescribe a short course of oxycodone for pain management.  Discussed that I do not recommend NSAIDs at this time due to theoretical delayed fracture healing  Time of visit 32 minutes, which included chart review, physical exam, treatment plan being performed, interpreted, and discussed with patient at today's visit.   Pertinent previous records reviewed include none   Follow Up: As needed   Subjective:   I, [name], am serving as a Neurosurgeon for Doctor Richardean Sale  Chief Complaint: left wrist pain   HPI:   04/21/22 Patient is a 62 year old male complaining of left wrist pain. Patient states that he was mowing the  lawn and tripped over a rock , pain radiated up to his shoulder , this was yesterday , he is able to move is fingers but decreased ROM in wrist due to pain, he is able to dress himself, no numbness or tingling, tylenol and ibu and that seems to help his pain.   Relevant Historical Information: None pertinent  Additional pertinent  review of systems negative.   Current Outpatient Medications:    EPINEPHrine 0.3 mg/0.3 mL IJ SOAJ injection, , Disp: , Rfl:    fexofenadine (ALLEGRA) 180 MG tablet, TAKE 1 TABLET EVERY DAY AS NEEDED, Disp: , Rfl: 4   ibuprofen (ADVIL,MOTRIN) 200 MG tablet, Take 200 mg by mouth every 6 (six) hours as needed., Disp: , Rfl:    Multiple Vitamin (MULTIVITAMIN) tablet, Take 1 tablet by mouth daily., Disp: , Rfl:    tamsulosin (FLOMAX) 0.4 MG CAPS capsule, Take 1 capsule (0.4 mg total) by mouth daily., Disp: 90 capsule, Rfl: 3   Objective:     Vitals:   04/21/22 1507  BP: 132/80  Pulse: 73  SpO2: 98%  Weight: 228 lb (103.4 kg)  Height: 5\' 10"  (1.778 m)      Body mass index is 32.71 kg/m.    Physical Exam:    General: Appears well, nad, nontoxic and pleasant Neuro:sensation intact, strength is 5/5 with df/pf/inv/ev, muscle tone wnl Skin:no susupicious lesions or rashes  Left wrist:   Large degree of generalized swelling appreciated. ROM  Ext 50, flexion 50, radial/ulnar deviation 20 TTP snuffbox, radial and ulnar styloid nttp over the dorsal carpals, volar carpals, 1st mcp, tfcc pain with resisted ext, flex or deviation  Sensation and strength intact  Electronically signed by:  Aleen Sells D.[facility] 4:44 PM 04/21/22

## 2022-04-23 ENCOUNTER — Encounter: Payer: Self-pay | Admitting: Sports Medicine

## 2022-04-23 DIAGNOSIS — S62001A Unspecified fracture of navicular [scaphoid] bone of right wrist, initial encounter for closed fracture: Secondary | ICD-10-CM | POA: Diagnosis not present

## 2022-04-23 DIAGNOSIS — S62002A Unspecified fracture of navicular [scaphoid] bone of left wrist, initial encounter for closed fracture: Secondary | ICD-10-CM | POA: Diagnosis not present

## 2022-04-24 DIAGNOSIS — J3089 Other allergic rhinitis: Secondary | ICD-10-CM | POA: Diagnosis not present

## 2022-04-24 DIAGNOSIS — J301 Allergic rhinitis due to pollen: Secondary | ICD-10-CM | POA: Diagnosis not present

## 2022-04-24 DIAGNOSIS — J3081 Allergic rhinitis due to animal (cat) (dog) hair and dander: Secondary | ICD-10-CM | POA: Diagnosis not present

## 2022-04-30 DIAGNOSIS — J3089 Other allergic rhinitis: Secondary | ICD-10-CM | POA: Diagnosis not present

## 2022-04-30 DIAGNOSIS — J3081 Allergic rhinitis due to animal (cat) (dog) hair and dander: Secondary | ICD-10-CM | POA: Diagnosis not present

## 2022-04-30 DIAGNOSIS — G4733 Obstructive sleep apnea (adult) (pediatric): Secondary | ICD-10-CM | POA: Diagnosis not present

## 2022-04-30 DIAGNOSIS — J301 Allergic rhinitis due to pollen: Secondary | ICD-10-CM | POA: Diagnosis not present

## 2022-05-02 DIAGNOSIS — Y999 Unspecified external cause status: Secondary | ICD-10-CM | POA: Diagnosis not present

## 2022-05-02 DIAGNOSIS — S62022A Displaced fracture of middle third of navicular [scaphoid] bone of left wrist, initial encounter for closed fracture: Secondary | ICD-10-CM | POA: Diagnosis not present

## 2022-05-02 DIAGNOSIS — X58XXXA Exposure to other specified factors, initial encounter: Secondary | ICD-10-CM | POA: Diagnosis not present

## 2022-05-08 DIAGNOSIS — J3089 Other allergic rhinitis: Secondary | ICD-10-CM | POA: Diagnosis not present

## 2022-05-09 DIAGNOSIS — S62022D Displaced fracture of middle third of navicular [scaphoid] bone of left wrist, subsequent encounter for fracture with routine healing: Secondary | ICD-10-CM | POA: Diagnosis not present

## 2022-05-14 DIAGNOSIS — J301 Allergic rhinitis due to pollen: Secondary | ICD-10-CM | POA: Diagnosis not present

## 2022-05-14 DIAGNOSIS — J3081 Allergic rhinitis due to animal (cat) (dog) hair and dander: Secondary | ICD-10-CM | POA: Diagnosis not present

## 2022-05-14 DIAGNOSIS — J3089 Other allergic rhinitis: Secondary | ICD-10-CM | POA: Diagnosis not present

## 2022-05-15 DIAGNOSIS — S62002A Unspecified fracture of navicular [scaphoid] bone of left wrist, initial encounter for closed fracture: Secondary | ICD-10-CM | POA: Diagnosis not present

## 2022-05-30 DIAGNOSIS — G4733 Obstructive sleep apnea (adult) (pediatric): Secondary | ICD-10-CM | POA: Diagnosis not present

## 2022-06-12 DIAGNOSIS — S62002D Unspecified fracture of navicular [scaphoid] bone of left wrist, subsequent encounter for fracture with routine healing: Secondary | ICD-10-CM | POA: Diagnosis not present

## 2022-06-13 DIAGNOSIS — J3089 Other allergic rhinitis: Secondary | ICD-10-CM | POA: Diagnosis not present

## 2022-06-13 DIAGNOSIS — J301 Allergic rhinitis due to pollen: Secondary | ICD-10-CM | POA: Diagnosis not present

## 2022-06-13 DIAGNOSIS — J3081 Allergic rhinitis due to animal (cat) (dog) hair and dander: Secondary | ICD-10-CM | POA: Diagnosis not present

## 2022-06-18 DIAGNOSIS — J3089 Other allergic rhinitis: Secondary | ICD-10-CM | POA: Diagnosis not present

## 2022-06-18 DIAGNOSIS — J301 Allergic rhinitis due to pollen: Secondary | ICD-10-CM | POA: Diagnosis not present

## 2022-06-18 DIAGNOSIS — J3081 Allergic rhinitis due to animal (cat) (dog) hair and dander: Secondary | ICD-10-CM | POA: Diagnosis not present

## 2022-06-19 ENCOUNTER — Encounter (HOSPITAL_BASED_OUTPATIENT_CLINIC_OR_DEPARTMENT_OTHER): Payer: Self-pay | Admitting: Pulmonary Disease

## 2022-06-19 ENCOUNTER — Ambulatory Visit (INDEPENDENT_AMBULATORY_CARE_PROVIDER_SITE_OTHER): Payer: BC Managed Care – PPO | Admitting: Pulmonary Disease

## 2022-06-19 VITALS — BP 126/72 | HR 75 | Ht 70.0 in | Wt 229.0 lb

## 2022-06-19 DIAGNOSIS — G4733 Obstructive sleep apnea (adult) (pediatric): Secondary | ICD-10-CM | POA: Diagnosis not present

## 2022-06-19 NOTE — Patient Instructions (Signed)
Call when your are ready to set up a home sleep study and we will schedule a follow up appointment after that

## 2022-06-19 NOTE — Progress Notes (Signed)
[facility]  Chief Complaint  Patient presents with   Establish Care    Wants to get re-evaluated for OSA. States he has lost about 50lbs since last sleep study. Still using cpap machine at night.     Past Surgical History:  He  has a past surgical history that includes Hernia repair (2005/2006); Tonsillectomy (01/13/1965); leg vein ablation; Colonoscopy (08/01/2019); and Foot surgery (Left, 09/26/2011).  Past Medical History:  Allergies, Back pain, Varicose veins  Constitutional:  BP 126/72   Pulse 75   Ht 5\' 10"  (1.778 m)   Wt 229 lb (103.9 kg)   SpO2 96% Comment: on RA  BMI 32.86 kg/m   Brief Summary:  Brent Gordon is a 62 y.o. male with obstructive sleep apnea.      Subjective:   I saw him in 2013.  He then saw Dr. Vassie Loll in 2018.  His last sleep study was from December 2018.  He weighed 274 lbs at that time.  Sleep study showed mild sleep apnea.  He was started on CPAP and has used it since.  He was found to have an elevated HbA1C.  His wife got very worried about this.  He started on a better diet and started doing swimming several days per week.  He was able to lose about 50 lbs.  His weight loss has been paused after he had hand surgery.  He is hoping to start swimming again once his hand recovers.  He is sleeping much better.  Doesn't snore as much.  Feels rested during the day.  He still uses CPAP, but has been able to sleep okay sometimes without CPAP on.  He goes to sleep at 10 pm.  He falls asleep 15.  He wakes up one time to use the bathroom.  He gets out of bed at 545 am.  He feels rested in the morning.  He denies morning headache.  He does not use anything to help him fall sleep or stay awake.  He denies sleep walking, sleep talking, bruxism, or nightmares.  There is no history of restless legs.  He denies sleep hallucinations, sleep paralysis, or cataplexy.  The Epworth score is 4 out of 24.   Physical Exam:    Appearance - well kempt   ENMT - no sinus tenderness, no oral exudate, no LAN, Mallampati 3 airway, no stridor  Respiratory - equal breath sounds bilaterally, no wheezing or rales  CV - s1s2 regular rate and rhythm, no murmurs  Ext - no clubbing, no edema; Lt hand in a brace  Skin - no rashes  Psych - normal mood and affect   Sleep Tests:  HST 11/29/11 >> AHI 5, SpO2 low 83% HST 01/07/17 >> 12.7, SpO2 low 83% Auto CPAP 03/21/22 to 06/18/22 >> used on 83 of 90 nights with average 5 hrs 31 min.  Average AHI 1.5 with median CPAP 8 and 95 th percentile CPAP 11 cm H2O  Social History:  He  reports that he has never smoked. He has never used smokeless tobacco. He reports that he does not drink alcohol and does not use drugs.  Family History:  His family history includes Arthritis in an other family member; Cancer in his mother and another family member; Diabetes in his maternal grandmother and mother; Hypertension in an other family member.    Discussion:  He has history of obstructive sleep apnea and has been compliant with CPAP.  He has significant weight  loss since his last sleep study.  He would like to determine whether he still has significant sleep apnea and could transition off CPAP therapy, or whether he needs to get a replacement CPAP machine.  He thinks he will make further progress with weight loss after his hand recovers from recent surgery and he can start swimming again.  Assessment/Plan:   Obstructive sleep apnea. - he is compliant with CPAP and reports benefit from therapy - he uses [employer] for his DME - current CPAP ordered January 2019 - continue auto CPAP 5 to 15 cm H2O - he will call when he feels like his weight is a good level and he wants to repeat a home sleep study, or if he wants to get a replacement CPAP machine  Time Spent Involved in Patient Care on Day of Examination:  36 minutes  Follow up:   Patient Instructions  Call when your are ready to  set up a home sleep study and we will schedule a follow up appointment after that  Medication List:   Allergies as of 06/19/2022   No Known Allergies      Medication List        Accurate as of June 19, 2022  4:10 PM. If you have any questions, ask your nurse or doctor.          STOP taking these medications    fexofenadine 180 MG tablet Commonly known as: ALLEGRA Stopped by: Coralyn Helling, MD   tamsulosin 0.4 MG Caps capsule Commonly known as: FLOMAX Stopped by: Coralyn Helling, MD       TAKE these medications    EPINEPHrine 0.3 mg/0.3 mL Soaj injection Commonly known as: EPI-PEN   ibuprofen 200 MG tablet Commonly known as: ADVIL Take 200 mg by mouth every 6 (six) hours as needed.   multivitamin tablet Take 1 tablet by mouth daily.   Tylenol 8 Hour 650 MG CR tablet Generic drug: acetaminophen        Signature:  Coralyn Helling, MD [facility] Pager - [phone] 06/19/2022, 4:10 PM

## 2022-06-28 DIAGNOSIS — G4733 Obstructive sleep apnea (adult) (pediatric): Secondary | ICD-10-CM | POA: Diagnosis not present

## 2022-07-03 DIAGNOSIS — G4733 Obstructive sleep apnea (adult) (pediatric): Secondary | ICD-10-CM | POA: Diagnosis not present

## 2022-07-10 ENCOUNTER — Other Ambulatory Visit: Payer: Self-pay | Admitting: Orthopedic Surgery

## 2022-07-10 ENCOUNTER — Ambulatory Visit
Admission: RE | Admit: 2022-07-10 | Discharge: 2022-07-10 | Disposition: A | Discharge status: Home / Self Care | Payer: BC Managed Care – PPO | Source: Ambulatory Visit | Attending: Orthopedic Surgery | Admitting: Orthopedic Surgery

## 2022-07-10 DIAGNOSIS — S62002A Unspecified fracture of navicular [scaphoid] bone of left wrist, initial encounter for closed fracture: Secondary | ICD-10-CM

## 2022-07-10 DIAGNOSIS — S62002D Unspecified fracture of navicular [scaphoid] bone of left wrist, subsequent encounter for fracture with routine healing: Secondary | ICD-10-CM | POA: Diagnosis not present

## 2022-07-11 DIAGNOSIS — J3089 Other allergic rhinitis: Secondary | ICD-10-CM | POA: Diagnosis not present

## 2022-07-11 DIAGNOSIS — J3081 Allergic rhinitis due to animal (cat) (dog) hair and dander: Secondary | ICD-10-CM | POA: Diagnosis not present

## 2022-07-11 DIAGNOSIS — J301 Allergic rhinitis due to pollen: Secondary | ICD-10-CM | POA: Diagnosis not present

## 2022-07-21 DIAGNOSIS — M25632 Stiffness of left wrist, not elsewhere classified: Secondary | ICD-10-CM | POA: Diagnosis not present

## 2022-07-21 DIAGNOSIS — S62002D Unspecified fracture of navicular [scaphoid] bone of left wrist, subsequent encounter for fracture with routine healing: Secondary | ICD-10-CM | POA: Diagnosis not present

## 2022-07-28 DIAGNOSIS — G4733 Obstructive sleep apnea (adult) (pediatric): Secondary | ICD-10-CM | POA: Diagnosis not present

## 2022-07-31 DIAGNOSIS — M25632 Stiffness of left wrist, not elsewhere classified: Secondary | ICD-10-CM | POA: Diagnosis not present

## 2022-07-31 DIAGNOSIS — J3089 Other allergic rhinitis: Secondary | ICD-10-CM | POA: Diagnosis not present

## 2022-07-31 DIAGNOSIS — S62002D Unspecified fracture of navicular [scaphoid] bone of left wrist, subsequent encounter for fracture with routine healing: Secondary | ICD-10-CM | POA: Diagnosis not present

## 2022-08-07 DIAGNOSIS — S62002D Unspecified fracture of navicular [scaphoid] bone of left wrist, subsequent encounter for fracture with routine healing: Secondary | ICD-10-CM | POA: Diagnosis not present

## 2022-08-07 DIAGNOSIS — M25632 Stiffness of left wrist, not elsewhere classified: Secondary | ICD-10-CM | POA: Diagnosis not present

## 2022-08-08 DIAGNOSIS — J301 Allergic rhinitis due to pollen: Secondary | ICD-10-CM | POA: Diagnosis not present

## 2022-08-08 DIAGNOSIS — J3089 Other allergic rhinitis: Secondary | ICD-10-CM | POA: Diagnosis not present

## 2022-08-08 DIAGNOSIS — J3081 Allergic rhinitis due to animal (cat) (dog) hair and dander: Secondary | ICD-10-CM | POA: Diagnosis not present

## 2022-08-13 DIAGNOSIS — J301 Allergic rhinitis due to pollen: Secondary | ICD-10-CM | POA: Diagnosis not present

## 2022-08-13 DIAGNOSIS — J3089 Other allergic rhinitis: Secondary | ICD-10-CM | POA: Diagnosis not present

## 2022-08-13 DIAGNOSIS — J3081 Allergic rhinitis due to animal (cat) (dog) hair and dander: Secondary | ICD-10-CM | POA: Diagnosis not present

## 2022-08-14 DIAGNOSIS — S62002D Unspecified fracture of navicular [scaphoid] bone of left wrist, subsequent encounter for fracture with routine healing: Secondary | ICD-10-CM | POA: Diagnosis not present

## 2022-08-14 DIAGNOSIS — M25632 Stiffness of left wrist, not elsewhere classified: Secondary | ICD-10-CM | POA: Diagnosis not present

## 2022-08-22 DIAGNOSIS — S62002D Unspecified fracture of navicular [scaphoid] bone of left wrist, subsequent encounter for fracture with routine healing: Secondary | ICD-10-CM | POA: Diagnosis not present

## 2022-08-22 DIAGNOSIS — M25632 Stiffness of left wrist, not elsewhere classified: Secondary | ICD-10-CM | POA: Diagnosis not present

## 2022-08-25 DIAGNOSIS — S62002D Unspecified fracture of navicular [scaphoid] bone of left wrist, subsequent encounter for fracture with routine healing: Secondary | ICD-10-CM | POA: Diagnosis not present

## 2022-08-26 DIAGNOSIS — M25632 Stiffness of left wrist, not elsewhere classified: Secondary | ICD-10-CM | POA: Diagnosis not present

## 2022-08-26 DIAGNOSIS — S62002D Unspecified fracture of navicular [scaphoid] bone of left wrist, subsequent encounter for fracture with routine healing: Secondary | ICD-10-CM | POA: Diagnosis not present

## 2022-08-28 DIAGNOSIS — G4733 Obstructive sleep apnea (adult) (pediatric): Secondary | ICD-10-CM | POA: Diagnosis not present

## 2022-09-03 DIAGNOSIS — S62002D Unspecified fracture of navicular [scaphoid] bone of left wrist, subsequent encounter for fracture with routine healing: Secondary | ICD-10-CM | POA: Diagnosis not present

## 2022-09-03 DIAGNOSIS — M25632 Stiffness of left wrist, not elsewhere classified: Secondary | ICD-10-CM | POA: Diagnosis not present

## 2022-09-08 DIAGNOSIS — Z130 Encounter for screening for diseases of the blood and blood-forming organs and certain disorders involving the immune mechanism: Secondary | ICD-10-CM | POA: Diagnosis not present

## 2022-09-08 DIAGNOSIS — Z1211 Encounter for screening for malignant neoplasm of colon: Secondary | ICD-10-CM | POA: Diagnosis not present

## 2022-09-08 DIAGNOSIS — Z823 Family history of stroke: Secondary | ICD-10-CM | POA: Diagnosis not present

## 2022-09-08 DIAGNOSIS — Z0000 Encounter for general adult medical examination without abnormal findings: Secondary | ICD-10-CM | POA: Diagnosis not present

## 2022-09-08 DIAGNOSIS — Z131 Encounter for screening for diabetes mellitus: Secondary | ICD-10-CM | POA: Diagnosis not present

## 2022-09-08 DIAGNOSIS — Z13220 Encounter for screening for lipoid disorders: Secondary | ICD-10-CM | POA: Diagnosis not present

## 2022-09-08 DIAGNOSIS — Z125 Encounter for screening for malignant neoplasm of prostate: Secondary | ICD-10-CM | POA: Diagnosis not present

## 2022-09-08 DIAGNOSIS — R7303 Prediabetes: Secondary | ICD-10-CM | POA: Diagnosis not present

## 2022-09-08 DIAGNOSIS — E786 Lipoprotein deficiency: Secondary | ICD-10-CM | POA: Diagnosis not present

## 2022-09-08 DIAGNOSIS — N401 Enlarged prostate with lower urinary tract symptoms: Secondary | ICD-10-CM | POA: Diagnosis not present

## 2022-09-10 DIAGNOSIS — M25632 Stiffness of left wrist, not elsewhere classified: Secondary | ICD-10-CM | POA: Diagnosis not present

## 2022-09-10 DIAGNOSIS — S62002D Unspecified fracture of navicular [scaphoid] bone of left wrist, subsequent encounter for fracture with routine healing: Secondary | ICD-10-CM | POA: Diagnosis not present

## 2022-09-17 DIAGNOSIS — J3089 Other allergic rhinitis: Secondary | ICD-10-CM | POA: Diagnosis not present

## 2022-10-01 DIAGNOSIS — J3089 Other allergic rhinitis: Secondary | ICD-10-CM | POA: Diagnosis not present

## 2022-10-01 DIAGNOSIS — J3081 Allergic rhinitis due to animal (cat) (dog) hair and dander: Secondary | ICD-10-CM | POA: Diagnosis not present

## 2022-10-01 DIAGNOSIS — R0602 Shortness of breath: Secondary | ICD-10-CM | POA: Diagnosis not present

## 2022-10-01 DIAGNOSIS — J301 Allergic rhinitis due to pollen: Secondary | ICD-10-CM | POA: Diagnosis not present

## 2022-10-07 DIAGNOSIS — J3089 Other allergic rhinitis: Secondary | ICD-10-CM | POA: Diagnosis not present

## 2022-10-09 ENCOUNTER — Telehealth (HOSPITAL_BASED_OUTPATIENT_CLINIC_OR_DEPARTMENT_OTHER): Payer: Self-pay | Admitting: Pulmonary Disease

## 2022-10-09 DIAGNOSIS — G4733 Obstructive sleep apnea (adult) (pediatric): Secondary | ICD-10-CM

## 2022-10-09 NOTE — Telephone Encounter (Signed)
Patient states he is ready to set up an appointment for his Home sleep study. Per last visit with Craige Cotta in June he was advised to let us know when he is ready. I do not see an order for HST in the patients active requests. Could an order be created to our [facility] team can schedule please? Please advise and call patient if needed.

## 2022-10-09 NOTE — Telephone Encounter (Signed)
Order placed

## 2022-10-17 DIAGNOSIS — J301 Allergic rhinitis due to pollen: Secondary | ICD-10-CM | POA: Diagnosis not present

## 2022-10-17 DIAGNOSIS — J3089 Other allergic rhinitis: Secondary | ICD-10-CM | POA: Diagnosis not present

## 2022-10-17 DIAGNOSIS — J3081 Allergic rhinitis due to animal (cat) (dog) hair and dander: Secondary | ICD-10-CM | POA: Diagnosis not present

## 2022-10-21 DIAGNOSIS — D485 Neoplasm of uncertain behavior of skin: Secondary | ICD-10-CM | POA: Diagnosis not present

## 2022-10-21 DIAGNOSIS — D229 Melanocytic nevi, unspecified: Secondary | ICD-10-CM | POA: Diagnosis not present

## 2022-10-21 DIAGNOSIS — D225 Melanocytic nevi of trunk: Secondary | ICD-10-CM | POA: Diagnosis not present

## 2022-10-21 DIAGNOSIS — L578 Other skin changes due to chronic exposure to nonionizing radiation: Secondary | ICD-10-CM | POA: Diagnosis not present

## 2022-10-21 DIAGNOSIS — L821 Other seborrheic keratosis: Secondary | ICD-10-CM | POA: Diagnosis not present

## 2022-10-21 DIAGNOSIS — L814 Other melanin hyperpigmentation: Secondary | ICD-10-CM | POA: Diagnosis not present

## 2022-11-05 DIAGNOSIS — G4733 Obstructive sleep apnea (adult) (pediatric): Secondary | ICD-10-CM | POA: Diagnosis not present

## 2022-11-11 DIAGNOSIS — G4733 Obstructive sleep apnea (adult) (pediatric): Secondary | ICD-10-CM | POA: Diagnosis not present

## 2022-11-14 DIAGNOSIS — J3089 Other allergic rhinitis: Secondary | ICD-10-CM | POA: Diagnosis not present

## 2022-11-14 DIAGNOSIS — J3081 Allergic rhinitis due to animal (cat) (dog) hair and dander: Secondary | ICD-10-CM | POA: Diagnosis not present

## 2022-11-14 DIAGNOSIS — J301 Allergic rhinitis due to pollen: Secondary | ICD-10-CM | POA: Diagnosis not present

## 2022-11-18 NOTE — Progress Notes (Addendum)
[facility] [address] [place] [address] Phone: [phone]   Assessment and Plan:     1. Chronic pain of left knee 2. Primary osteoarthritis of left knee -Chronic with exacerbation, subsequent visit - Consistent with recurrent flare of osteoarthritis.  Patient has had mild to moderate improvement in symptoms after PRP injection on 01/22/2022, however his symptoms never fully resolved after injection - Patient elects for intra-articular left knee CSI.  Tolerated well per note below - May continue to use Tylenol as needed for day-to-day pain relief -Encouraged to continue weight loss journey    Procedure: Knee Joint Injection Side: Left Indication: Flare of osteoarthritis  Risks explained and consent was given verbally. The site was cleaned with alcohol prep. A needle was introduced with an anterio-lateral approach. Injection given using 2mL of 1% lidocaine without epinephrine and 1mL of kenalog 40mg /ml. This was well tolerated and resulted in symptomatic relief.  Needle was removed, hemostasis achieved, and post injection instructions were explained.   Pt was advised to call or return to clinic if these symptoms worsen or fail to improve as anticipated.   3. Chronic pain of right ankle -Chronic with exacerbation, initial visit - Chronic lateral ankle pain.  Potentially representing flares of osteoarthritis versus excess strain from compensation due to left knee pain - We will see if ankle pain improves after left knee CSI hopefully leads to decrease compensation - We use Tylenol for day-to-day pain relief -Can use topical Voltaren gel over areas of pain   Pertinent previous records reviewed include orthopedic surgery note 06/12/2022, orthopedic surgery note 06/19/2022, orthopedic surgery note 07/10/22 related to scaphoid fracture   Follow Up: 4 weeks for reevaluation.  If no improvement or worsening of symptoms of left knee pain,  could consider HA versus Zilretta injection.  If no improvement or worsening symptoms of right ankle pain, could obtain x-ray and discuss CSI   Subjective:   I, Brent Gordon, am serving as a Neurosurgeon for Doctor Richardean Sale   Chief Complaint: left wrist pain    HPI:    04/21/22 Patient is a 62 year old male complaining of left wrist pain. Patient states that he was mowing the  lawn and tripped over a rock , pain radiated up to his shoulder , this was yesterday , he is able to move is fingers but decreased ROM in wrist due to pain, he is able to dress himself, no numbness or tingling, tylenol and ibu and that seems to help his pain.   11/19/2022 Patient states thinks he might want to do PRP again . He has left ankle pain as well a sharp pain on the lateral aspect    Relevant Historical Information: None pertinent Additional pertinent review of systems negative.   Current Outpatient Medications:    acetaminophen (TYLENOL 8 HOUR) 650 MG CR tablet, , Disp: , Rfl:    EPINEPHrine 0.3 mg/0.3 mL IJ SOAJ injection, , Disp: , Rfl:    ibuprofen (ADVIL,MOTRIN) 200 MG tablet, Take 200 mg by mouth every 6 (six) hours as needed., Disp: , Rfl:    Multiple Vitamin (MULTIVITAMIN) tablet, Take 1 tablet by mouth daily., Disp: , Rfl:    Objective:     Vitals:   11/19/22 1539  Pulse: 72  SpO2: 97%  Weight: 229 lb (103.9 kg)  Height: 5\' 10"  (1.778 m)      Body mass index is 32.86 kg/m.    Physical Exam:  General:  awake, alert oriented, no acute distress nontoxic Skin: no suspicious lesions or rashes Neuro:sensation intact, no deficits, strength 5/5 with no deficits, no atrophy, normal muscle tone Psych: No signs of anxiety, depression or other mood disorder   Left knee: Mild swelling No deformity Positive fluid wave, joint milking ROM Flex 100 ext 5 NTTP over the quad tendon, medial fem condyle, lat fem condyle, patella, plica, patella tendon, tibial tuberostiy, fibular head,  posterior fossa, pes anserine bursa, gerdy's tubercle, medial jt line, lateral jt line Neg anterior and posterior drawer Neg lachman Neg sag sign Negative varus stress Negative valgus stress Negative McMurray Negative Thessaly   Gait normal     Electronically signed by:  [facility] 4:05 PM 12/18/22

## 2022-11-19 ENCOUNTER — Ambulatory Visit: Payer: BC Managed Care – PPO | Admitting: Sports Medicine

## 2022-11-19 VITALS — HR 72 | Ht 70.0 in | Wt 229.0 lb

## 2022-11-19 DIAGNOSIS — M25571 Pain in right ankle and joints of right foot: Secondary | ICD-10-CM

## 2022-11-19 DIAGNOSIS — G8929 Other chronic pain: Secondary | ICD-10-CM

## 2022-11-19 DIAGNOSIS — M1712 Unilateral primary osteoarthritis, left knee: Secondary | ICD-10-CM

## 2022-11-19 DIAGNOSIS — M25562 Pain in left knee: Secondary | ICD-10-CM | POA: Diagnosis not present

## 2022-12-09 DIAGNOSIS — I83891 Varicose veins of right lower extremities with other complications: Secondary | ICD-10-CM | POA: Diagnosis not present

## 2022-12-09 DIAGNOSIS — I872 Venous insufficiency (chronic) (peripheral): Secondary | ICD-10-CM | POA: Diagnosis not present

## 2022-12-09 DIAGNOSIS — I8311 Varicose veins of right lower extremity with inflammation: Secondary | ICD-10-CM | POA: Diagnosis not present

## 2022-12-09 DIAGNOSIS — M79661 Pain in right lower leg: Secondary | ICD-10-CM | POA: Diagnosis not present

## 2022-12-09 DIAGNOSIS — L299 Pruritus, unspecified: Secondary | ICD-10-CM | POA: Diagnosis not present

## 2022-12-12 DIAGNOSIS — G4733 Obstructive sleep apnea (adult) (pediatric): Secondary | ICD-10-CM | POA: Diagnosis not present

## 2022-12-17 DIAGNOSIS — J301 Allergic rhinitis due to pollen: Secondary | ICD-10-CM | POA: Diagnosis not present

## 2022-12-17 DIAGNOSIS — J3081 Allergic rhinitis due to animal (cat) (dog) hair and dander: Secondary | ICD-10-CM | POA: Diagnosis not present

## 2022-12-17 DIAGNOSIS — J3089 Other allergic rhinitis: Secondary | ICD-10-CM | POA: Diagnosis not present

## 2022-12-17 NOTE — Progress Notes (Unsigned)
    Aleen Sells D.[facility] [address] [place] [address] Phone: [phone]   Assessment and Plan:     There are no diagnoses linked to this encounter.  ***   Pertinent previous records reviewed include ***    Follow Up: ***     Subjective:   I, Navdeep Halt, am serving as a Neurosurgeon for Doctor Richardean Sale   Chief Complaint: left wrist pain    HPI:    04/21/22 Patient is a 62 year old male complaining of left wrist pain. Patient states that he was mowing the  lawn and tripped over a rock , pain radiated up to his shoulder , this was yesterday , he is able to move is fingers but decreased ROM in wrist due to pain, he is able to dress himself, no numbness or tingling, tylenol and ibu and that seems to help his pain.    11/19/2022 Patient states thinks he might want to do PRP again . He has left ankle pain as well a sharp pain on the lateral aspect   12/18/2022 Patient states   Relevant Historical Information: None pertinent  Additional pertinent review of systems negative.   Current Outpatient Medications:    acetaminophen (TYLENOL 8 HOUR) 650 MG CR tablet, , Disp: , Rfl:    EPINEPHrine 0.3 mg/0.3 mL IJ SOAJ injection, , Disp: , Rfl:    ibuprofen (ADVIL,MOTRIN) 200 MG tablet, Take 200 mg by mouth every 6 (six) hours as needed., Disp: , Rfl:    Multiple Vitamin (MULTIVITAMIN) tablet, Take 1 tablet by mouth daily., Disp: , Rfl:    Objective:     There were no vitals filed for this visit.    There is no height or weight on file to calculate BMI.    Physical Exam:    ***   Electronically signed by:  Aleen Sells [facility] 7:39 AM 12/17/22

## 2022-12-18 ENCOUNTER — Ambulatory Visit: Payer: BC Managed Care – PPO

## 2022-12-18 ENCOUNTER — Ambulatory Visit: Payer: BC Managed Care – PPO | Admitting: Sports Medicine

## 2022-12-18 VITALS — BP 132/80 | HR 74 | Ht 70.0 in | Wt 229.0 lb

## 2022-12-18 DIAGNOSIS — M25571 Pain in right ankle and joints of right foot: Secondary | ICD-10-CM

## 2022-12-18 DIAGNOSIS — M25562 Pain in left knee: Secondary | ICD-10-CM

## 2022-12-18 DIAGNOSIS — M19071 Primary osteoarthritis, right ankle and foot: Secondary | ICD-10-CM | POA: Diagnosis not present

## 2022-12-18 DIAGNOSIS — G8929 Other chronic pain: Secondary | ICD-10-CM

## 2022-12-18 DIAGNOSIS — M1712 Unilateral primary osteoarthritis, left knee: Secondary | ICD-10-CM

## 2022-12-18 DIAGNOSIS — M7731 Calcaneal spur, right foot: Secondary | ICD-10-CM | POA: Diagnosis not present

## 2022-12-24 DIAGNOSIS — G4733 Obstructive sleep apnea (adult) (pediatric): Secondary | ICD-10-CM | POA: Diagnosis not present

## 2023-01-01 DIAGNOSIS — I83891 Varicose veins of right lower extremities with other complications: Secondary | ICD-10-CM | POA: Diagnosis not present

## 2023-01-01 DIAGNOSIS — Z01818 Encounter for other preprocedural examination: Secondary | ICD-10-CM | POA: Diagnosis not present

## 2023-01-09 DIAGNOSIS — J3089 Other allergic rhinitis: Secondary | ICD-10-CM | POA: Diagnosis not present

## 2023-01-09 DIAGNOSIS — J3081 Allergic rhinitis due to animal (cat) (dog) hair and dander: Secondary | ICD-10-CM | POA: Diagnosis not present

## 2023-01-09 DIAGNOSIS — J301 Allergic rhinitis due to pollen: Secondary | ICD-10-CM | POA: Diagnosis not present

## 2023-01-11 DIAGNOSIS — G4733 Obstructive sleep apnea (adult) (pediatric): Secondary | ICD-10-CM | POA: Diagnosis not present

## 2023-01-13 DIAGNOSIS — G4733 Obstructive sleep apnea (adult) (pediatric): Secondary | ICD-10-CM | POA: Diagnosis not present

## 2023-01-16 DIAGNOSIS — J3089 Other allergic rhinitis: Secondary | ICD-10-CM | POA: Diagnosis not present

## 2023-01-21 DIAGNOSIS — J3089 Other allergic rhinitis: Secondary | ICD-10-CM | POA: Diagnosis not present

## 2023-01-28 DIAGNOSIS — J3089 Other allergic rhinitis: Secondary | ICD-10-CM | POA: Diagnosis not present

## 2023-01-28 DIAGNOSIS — J3081 Allergic rhinitis due to animal (cat) (dog) hair and dander: Secondary | ICD-10-CM | POA: Diagnosis not present

## 2023-01-28 DIAGNOSIS — J301 Allergic rhinitis due to pollen: Secondary | ICD-10-CM | POA: Diagnosis not present

## 2023-02-03 DIAGNOSIS — J3089 Other allergic rhinitis: Secondary | ICD-10-CM | POA: Diagnosis not present

## 2023-02-09 DIAGNOSIS — G4733 Obstructive sleep apnea (adult) (pediatric): Secondary | ICD-10-CM | POA: Diagnosis not present

## 2023-02-10 DIAGNOSIS — J3089 Other allergic rhinitis: Secondary | ICD-10-CM | POA: Diagnosis not present

## 2023-02-11 DIAGNOSIS — G4733 Obstructive sleep apnea (adult) (pediatric): Secondary | ICD-10-CM | POA: Diagnosis not present

## 2023-02-18 DIAGNOSIS — I83891 Varicose veins of right lower extremities with other complications: Secondary | ICD-10-CM | POA: Diagnosis not present

## 2023-02-25 DIAGNOSIS — G4733 Obstructive sleep apnea (adult) (pediatric): Secondary | ICD-10-CM | POA: Diagnosis not present

## 2023-03-12 DIAGNOSIS — G4733 Obstructive sleep apnea (adult) (pediatric): Secondary | ICD-10-CM | POA: Diagnosis not present

## 2023-03-13 DIAGNOSIS — J3089 Other allergic rhinitis: Secondary | ICD-10-CM | POA: Diagnosis not present

## 2023-03-13 DIAGNOSIS — J3081 Allergic rhinitis due to animal (cat) (dog) hair and dander: Secondary | ICD-10-CM | POA: Diagnosis not present

## 2023-03-17 DIAGNOSIS — Z789 Other specified health status: Secondary | ICD-10-CM | POA: Diagnosis not present

## 2023-03-17 DIAGNOSIS — G4733 Obstructive sleep apnea (adult) (pediatric): Secondary | ICD-10-CM | POA: Diagnosis not present

## 2023-03-17 DIAGNOSIS — R635 Abnormal weight gain: Secondary | ICD-10-CM | POA: Diagnosis not present

## 2023-03-25 DIAGNOSIS — I83891 Varicose veins of right lower extremities with other complications: Secondary | ICD-10-CM | POA: Diagnosis not present

## 2023-04-09 DIAGNOSIS — G4733 Obstructive sleep apnea (adult) (pediatric): Secondary | ICD-10-CM | POA: Diagnosis not present

## 2023-04-14 DIAGNOSIS — J3081 Allergic rhinitis due to animal (cat) (dog) hair and dander: Secondary | ICD-10-CM | POA: Diagnosis not present

## 2023-04-14 DIAGNOSIS — J301 Allergic rhinitis due to pollen: Secondary | ICD-10-CM | POA: Diagnosis not present

## 2023-04-14 DIAGNOSIS — J3089 Other allergic rhinitis: Secondary | ICD-10-CM | POA: Diagnosis not present

## 2023-05-13 DIAGNOSIS — J3089 Other allergic rhinitis: Secondary | ICD-10-CM | POA: Diagnosis not present

## 2023-05-13 DIAGNOSIS — J3081 Allergic rhinitis due to animal (cat) (dog) hair and dander: Secondary | ICD-10-CM | POA: Diagnosis not present

## 2023-06-15 DIAGNOSIS — J3089 Other allergic rhinitis: Secondary | ICD-10-CM | POA: Diagnosis not present

## 2023-06-15 DIAGNOSIS — J3081 Allergic rhinitis due to animal (cat) (dog) hair and dander: Secondary | ICD-10-CM | POA: Diagnosis not present

## 2023-06-15 DIAGNOSIS — J301 Allergic rhinitis due to pollen: Secondary | ICD-10-CM | POA: Diagnosis not present

## 2023-06-23 DIAGNOSIS — L819 Disorder of pigmentation, unspecified: Secondary | ICD-10-CM | POA: Diagnosis not present

## 2023-06-23 DIAGNOSIS — I8311 Varicose veins of right lower extremity with inflammation: Secondary | ICD-10-CM | POA: Diagnosis not present

## 2023-06-23 DIAGNOSIS — I87391 Chronic venous hypertension (idiopathic) with other complications of right lower extremity: Secondary | ICD-10-CM | POA: Diagnosis not present

## 2023-06-23 DIAGNOSIS — L299 Pruritus, unspecified: Secondary | ICD-10-CM | POA: Diagnosis not present

## 2023-07-22 DIAGNOSIS — J3089 Other allergic rhinitis: Secondary | ICD-10-CM | POA: Diagnosis not present

## 2023-07-22 DIAGNOSIS — J301 Allergic rhinitis due to pollen: Secondary | ICD-10-CM | POA: Diagnosis not present

## 2023-07-22 DIAGNOSIS — J3081 Allergic rhinitis due to animal (cat) (dog) hair and dander: Secondary | ICD-10-CM | POA: Diagnosis not present

## 2023-07-24 DIAGNOSIS — Z23 Encounter for immunization: Secondary | ICD-10-CM | POA: Diagnosis not present

## 2023-07-27 DIAGNOSIS — R600 Localized edema: Secondary | ICD-10-CM | POA: Diagnosis not present

## 2023-07-27 DIAGNOSIS — I87391 Chronic venous hypertension (idiopathic) with other complications of right lower extremity: Secondary | ICD-10-CM | POA: Diagnosis not present

## 2023-07-27 DIAGNOSIS — I82501 Chronic embolism and thrombosis of unspecified deep veins of right lower extremity: Secondary | ICD-10-CM | POA: Diagnosis not present

## 2023-08-19 DIAGNOSIS — J301 Allergic rhinitis due to pollen: Secondary | ICD-10-CM | POA: Diagnosis not present

## 2023-08-19 DIAGNOSIS — J3081 Allergic rhinitis due to animal (cat) (dog) hair and dander: Secondary | ICD-10-CM | POA: Diagnosis not present

## 2023-08-19 DIAGNOSIS — J3089 Other allergic rhinitis: Secondary | ICD-10-CM | POA: Diagnosis not present

## 2023-08-24 DIAGNOSIS — Z23 Encounter for immunization: Secondary | ICD-10-CM | POA: Diagnosis not present

## 2023-08-25 ENCOUNTER — Other Ambulatory Visit (HOSPITAL_BASED_OUTPATIENT_CLINIC_OR_DEPARTMENT_OTHER): Payer: Self-pay | Admitting: Family Medicine

## 2023-08-25 DIAGNOSIS — R7989 Other specified abnormal findings of blood chemistry: Secondary | ICD-10-CM

## 2023-09-11 ENCOUNTER — Ambulatory Visit (HOSPITAL_BASED_OUTPATIENT_CLINIC_OR_DEPARTMENT_OTHER)
Admission: RE | Admit: 2023-09-11 | Discharge: 2023-09-11 | Disposition: A | Discharge status: Home / Self Care | Payer: Self-pay | Source: Ambulatory Visit | Attending: Family Medicine | Admitting: Family Medicine

## 2023-09-11 DIAGNOSIS — R7989 Other specified abnormal findings of blood chemistry: Secondary | ICD-10-CM | POA: Insufficient documentation

## 2023-09-15 DIAGNOSIS — J3089 Other allergic rhinitis: Secondary | ICD-10-CM | POA: Diagnosis not present

## 2023-09-15 DIAGNOSIS — J3081 Allergic rhinitis due to animal (cat) (dog) hair and dander: Secondary | ICD-10-CM | POA: Diagnosis not present

## 2023-09-15 DIAGNOSIS — Z0000 Encounter for general adult medical examination without abnormal findings: Secondary | ICD-10-CM | POA: Diagnosis not present

## 2023-09-15 DIAGNOSIS — Z13220 Encounter for screening for lipoid disorders: Secondary | ICD-10-CM | POA: Diagnosis not present

## 2023-09-15 DIAGNOSIS — J301 Allergic rhinitis due to pollen: Secondary | ICD-10-CM | POA: Diagnosis not present

## 2023-09-15 DIAGNOSIS — Z130 Encounter for screening for diseases of the blood and blood-forming organs and certain disorders involving the immune mechanism: Secondary | ICD-10-CM | POA: Diagnosis not present

## 2023-09-15 DIAGNOSIS — Z125 Encounter for screening for malignant neoplasm of prostate: Secondary | ICD-10-CM | POA: Diagnosis not present

## 2023-09-15 DIAGNOSIS — R7303 Prediabetes: Secondary | ICD-10-CM | POA: Diagnosis not present

## 2023-09-16 DIAGNOSIS — Z0000 Encounter for general adult medical examination without abnormal findings: Secondary | ICD-10-CM | POA: Diagnosis not present

## 2023-09-16 DIAGNOSIS — Z125 Encounter for screening for malignant neoplasm of prostate: Secondary | ICD-10-CM | POA: Diagnosis not present

## 2023-09-16 DIAGNOSIS — Z87898 Personal history of other specified conditions: Secondary | ICD-10-CM | POA: Diagnosis not present

## 2023-09-23 DIAGNOSIS — J3089 Other allergic rhinitis: Secondary | ICD-10-CM | POA: Diagnosis not present

## 2023-09-23 DIAGNOSIS — Z5181 Encounter for therapeutic drug level monitoring: Secondary | ICD-10-CM | POA: Diagnosis not present

## 2023-09-23 DIAGNOSIS — Z79899 Other long term (current) drug therapy: Secondary | ICD-10-CM | POA: Diagnosis not present

## 2023-09-23 DIAGNOSIS — R0602 Shortness of breath: Secondary | ICD-10-CM | POA: Diagnosis not present

## 2023-10-05 DIAGNOSIS — M79604 Pain in right leg: Secondary | ICD-10-CM | POA: Diagnosis not present

## 2023-10-05 DIAGNOSIS — M79661 Pain in right lower leg: Secondary | ICD-10-CM | POA: Diagnosis not present

## 2023-10-15 DIAGNOSIS — J3089 Other allergic rhinitis: Secondary | ICD-10-CM | POA: Diagnosis not present

## 2023-10-15 DIAGNOSIS — J3081 Allergic rhinitis due to animal (cat) (dog) hair and dander: Secondary | ICD-10-CM | POA: Diagnosis not present

## 2023-10-15 DIAGNOSIS — J301 Allergic rhinitis due to pollen: Secondary | ICD-10-CM | POA: Diagnosis not present

## 2023-11-24 DIAGNOSIS — L570 Actinic keratosis: Secondary | ICD-10-CM | POA: Diagnosis not present

## 2023-11-24 DIAGNOSIS — D229 Melanocytic nevi, unspecified: Secondary | ICD-10-CM | POA: Diagnosis not present

## 2023-11-24 DIAGNOSIS — L814 Other melanin hyperpigmentation: Secondary | ICD-10-CM | POA: Diagnosis not present

## 2023-11-24 DIAGNOSIS — D485 Neoplasm of uncertain behavior of skin: Secondary | ICD-10-CM | POA: Diagnosis not present

## 2023-11-24 DIAGNOSIS — L821 Other seborrheic keratosis: Secondary | ICD-10-CM | POA: Diagnosis not present

## 2023-11-27 DIAGNOSIS — Z23 Encounter for immunization: Secondary | ICD-10-CM | POA: Diagnosis not present

## 2023-12-16 DIAGNOSIS — J3089 Other allergic rhinitis: Secondary | ICD-10-CM | POA: Diagnosis not present

## 2024-01-06 DIAGNOSIS — J3089 Other allergic rhinitis: Secondary | ICD-10-CM | POA: Diagnosis not present

## 2024-01-11 DIAGNOSIS — J3081 Allergic rhinitis due to animal (cat) (dog) hair and dander: Secondary | ICD-10-CM | POA: Diagnosis not present

## 2024-01-11 DIAGNOSIS — J3089 Other allergic rhinitis: Secondary | ICD-10-CM | POA: Diagnosis not present

## 2024-01-11 DIAGNOSIS — J301 Allergic rhinitis due to pollen: Secondary | ICD-10-CM | POA: Diagnosis not present

## 2024-01-25 NOTE — Progress Notes (Unsigned)
 "               [facility] [address] [place] [address] Phone: [phone]   Assessment and Plan:     1. Primary osteoarthritis of left knee (Primary) 2. Chronic pain of left knee -Chronic with exacerbation, subsequent visit - Recurrent flare of left knee pain most consistent with flare of osteoarthritis - Recommend further evaluation with MRI of left knee based on failure to improve despite >6 weeks of conservative therapy, pain with day-to-day activities, pain >6/10, x-ray imaging - Use Tylenol  500 to 1000 mg tablets 2-3 times a day for day-to-day pain relief - Patient has had mild improvement from PRP injections and essentially no improvement from CSI's in the past  3. Chronic left shoulder pain -Chronic with exacerbation, subsequent visit - Continued chronic left shoulder pain likely multi factorial with severe glenohumeral osteoarthritis, partial-thickness tears of supraspinatus, degenerative tears of labrum as seen on prior MRI - Patient established with orthopedic surgery last year and discussed the possibility of total shoulder replacement.  Recommend reaching back out to orthopedic office to reestablish care  4. Primary osteoarthritis of right ankle 5. Chronic pain of right ankle -Chronic with exacerbation, subsequent visit - Consistent with recurrent flare of right ankle osteoarthritis flared over winter months with increased physical activity - Use Tylenol  500 to 1000 mg tablets 2-3 times a day for day-to-day pain relief - Continue HEP and physical activity as tolerated - Could consider prescription NSAID course such as meloxicam  versus CSI if no improvement by follow-up visit    Pertinent previous records reviewed include prior imaging of right ankle, left shoulder, left knee   Follow Up: 1 week after MRI of left knee to review results and discuss treatment plan.  Could consider repeating injections versus orthopedic surgery  referral.  Could consider ankle CSI versus meloxicam  course   Subjective:   I, Brent Gordon, am serving as a neurosurgeon for Doctor Brent Gordon  Chief Complaint: knee, shoulder and foot pain   HPI:   01/26/2024 Patient is a 64 year old male with knee, shoulder and foot pain. Patient states knee pain is back and locking up intermittently.  11/19/2022 1. Chronic pain of left knee 2. Primary osteoarthritis of left knee -Chronic with exacerbation, subsequent visit - Consistent with recurrent flare of osteoarthritis.  Patient has had mild to moderate improvement in symptoms after PRP injection on 01/22/2022, however his symptoms never fully resolved after injection - Patient elects for intra-articular left knee CSI.  Tolerated well per note below - May continue to use Tylenol  as needed for day-to-day pain relief -Encouraged to continue weight loss journey     Procedure: Knee Joint Injection Side: Left Indication: Flare of osteoarthritis   Risks explained and consent was given verbally. The site was cleaned with alcohol prep. A needle was introduced with an anterio-lateral approach. Injection given using 2mL of 1% lidocaine  without epinephrine  and 1mL of kenalog 40mg /ml. This was well tolerated and resulted in symptomatic relief.  Needle was removed, hemostasis achieved, and post injection instructions were explained.   Pt was advised to call or return to clinic if these symptoms worsen or fail to improve as anticipated.     Relevant Historical Information: None pertinent  Additional pertinent review of systems negative.  Current Medications[1]   Objective:     Vitals:   01/26/24 1545  Pulse: 70  SpO2: 97%  Weight: 227 lb (103 kg)  Height:  5' 10 (1.778 m)      Body mass index is 32.57 kg/m.    Physical Exam:    General:  awake, alert oriented, no acute distress nontoxic Skin: no suspicious lesions or rashes Neuro:sensation intact, no deficits, strength 5/5 with no  deficits, no atrophy, normal muscle tone Psych: No signs of anxiety, depression or other mood disorder   Left knee: Mild swelling No deformity Positive fluid wave, joint milking ROM Flex 100 ext 5 NTTP over the quad tendon, medial fem condyle, lat fem condyle, patella, plica, patella tendon, tibial tuberostiy, fibular head, posterior fossa, pes anserine bursa, gerdy's tubercle, medial jt line, lateral jt line Neg anterior and posterior drawer Neg lachman Neg sag sign Negative varus stress Negative valgus stress Negative McMurray Negative Thessaly   Gait normal     Electronically signed by:  [facility] 4:16 PM 01/26/2024     [1]  Current Outpatient Medications:    acetaminophen  (TYLENOL  8 HOUR) 650 MG CR tablet, , Disp: , Rfl:    EPINEPHrine  0.3 mg/0.3 mL IJ SOAJ injection, , Disp: , Rfl:    ibuprofen (ADVIL,MOTRIN) 200 MG tablet, Take 200 mg by mouth every 6 (six) hours as needed., Disp: , Rfl:    Multiple Vitamin (MULTIVITAMIN) tablet, Take 1 tablet by mouth daily., Disp: , Rfl:   "

## 2024-01-26 ENCOUNTER — Ambulatory Visit: Admitting: Sports Medicine

## 2024-01-26 VITALS — HR 70 | Ht 70.0 in | Wt 227.0 lb

## 2024-01-26 DIAGNOSIS — M25562 Pain in left knee: Secondary | ICD-10-CM

## 2024-01-26 DIAGNOSIS — M25571 Pain in right ankle and joints of right foot: Secondary | ICD-10-CM | POA: Diagnosis not present

## 2024-01-26 DIAGNOSIS — M25512 Pain in left shoulder: Secondary | ICD-10-CM

## 2024-01-26 DIAGNOSIS — M1712 Unilateral primary osteoarthritis, left knee: Secondary | ICD-10-CM

## 2024-01-26 DIAGNOSIS — G8929 Other chronic pain: Secondary | ICD-10-CM

## 2024-01-26 DIAGNOSIS — M19071 Primary osteoarthritis, right ankle and foot: Secondary | ICD-10-CM

## 2024-01-26 NOTE — Patient Instructions (Signed)
 MRI left knee   Follow up 1 week after to discuss results   Recommend reaching out to Dr. Dozier for your shoulder . Call and let us  know if you need a referral

## 2024-02-10 ENCOUNTER — Ambulatory Visit
Admission: RE | Admit: 2024-02-10 | Discharge: 2024-02-10 | Disposition: A | Source: Ambulatory Visit | Attending: Sports Medicine

## 2024-02-10 DIAGNOSIS — G8929 Other chronic pain: Secondary | ICD-10-CM

## 2024-02-16 ENCOUNTER — Ambulatory Visit: Payer: Self-pay | Admitting: Sports Medicine

## 2024-02-16 ENCOUNTER — Encounter: Payer: Self-pay | Admitting: Sports Medicine

## 2024-02-19 NOTE — Progress Notes (Unsigned)
"             ° °   [facility] [address] [place] [address] Phone: [phone]   Assessment and Plan:     ***    Pertinent previous records reviewed include ***   Follow Up: ***     Subjective:   I, Brent Gordon, am serving as a neurosurgeon for Doctor Morene Mace   Chief Complaint: knee, shoulder and foot pain    HPI:    01/26/2024 Patient is a 64 year old male with knee, shoulder and foot pain. Patient states knee pain is back and locking up intermittently.   11/19/2022 1. Chronic pain of left knee 2. Primary osteoarthritis of left knee -Chronic with exacerbation, subsequent visit - Consistent with recurrent flare of osteoarthritis.  Patient has had mild to moderate improvement in symptoms after PRP injection on 01/22/2022, however his symptoms never fully resolved after injection - Patient elects for intra-articular left knee CSI.  Tolerated well per note below - May continue to use Tylenol  as needed for day-to-day pain relief -Encouraged to continue weight loss journey     Procedure: Knee Joint Injection Side: Left Indication: Flare of osteoarthritis   Risks explained and consent was given verbally. The site was cleaned with alcohol prep. A needle was introduced with an anterio-lateral approach. Injection given using 2mL of 1% lidocaine  without epinephrine  and 1mL of kenalog 40mg /ml. This was well tolerated and resulted in symptomatic relief.  Needle was removed, hemostasis achieved, and post injection instructions were explained.   Pt was advised to call or return to clinic if these symptoms worsen or fail to improve as anticipated.     02/22/2024 Patient states   Relevant Historical Information: None pertinent  Additional pertinent review of systems negative.  Current Medications[1]   Objective:     There were no vitals filed for this visit.    There is no height or weight on file to calculate BMI.    Physical Exam:     ***   Electronically signed by:  Odis Mace D.[facility] 7:22 AM 02/19/24    [1]  Current Outpatient Medications:    acetaminophen  (TYLENOL  8 HOUR) 650 MG CR tablet, , Disp: , Rfl:    EPINEPHrine  0.3 mg/0.3 mL IJ SOAJ injection, , Disp: , Rfl:    ibuprofen (ADVIL,MOTRIN) 200 MG tablet, Take 200 mg by mouth every 6 (six) hours as needed., Disp: , Rfl:    Multiple Vitamin (MULTIVITAMIN) tablet, Take 1 tablet by mouth daily., Disp: , Rfl:   "

## 2024-02-22 ENCOUNTER — Ambulatory Visit: Admitting: Sports Medicine
